# Patient Record
Sex: Female | Born: 1960 | Race: White | Hispanic: No | Smoking: Current every day smoker
Health system: Southern US, Community
[De-identification: ages and names within clinical notes are randomized; demographics above are authoritative.]

## PROBLEM LIST (undated history)

## (undated) DIAGNOSIS — C50919 Malignant neoplasm of unspecified site of unspecified female breast: Secondary | ICD-10-CM

## (undated) DIAGNOSIS — E7800 Pure hypercholesterolemia, unspecified: Secondary | ICD-10-CM

## (undated) DIAGNOSIS — K589 Irritable bowel syndrome without diarrhea: Secondary | ICD-10-CM

## (undated) DIAGNOSIS — C649 Malignant neoplasm of unspecified kidney, except renal pelvis: Secondary | ICD-10-CM

## (undated) HISTORY — PX: CHOLECYSTECTOMY: SHX55

## (undated) HISTORY — DX: Malignant neoplasm of unspecified site of unspecified female breast: C50.919

## (undated) HISTORY — PX: PARTIAL NEPHRECTOMY: SHX414

## (undated) HISTORY — PX: CATARACT EXTRACTION: SUR2

## (undated) HISTORY — PX: PARTIAL COLECTOMY: SHX5273

## (undated) HISTORY — DX: Pure hypercholesterolemia, unspecified: E78.00

## (undated) HISTORY — DX: Malignant neoplasm of unspecified kidney, except renal pelvis: C64.9

## (undated) HISTORY — DX: Irritable bowel syndrome, unspecified: K58.9

---

## 1998-10-02 ENCOUNTER — Other Ambulatory Visit: Admission: RE | Admit: 1998-10-02 | Discharge: 1998-10-02 | Payer: Self-pay | Admitting: Obstetrics and Gynecology

## 2000-02-11 ENCOUNTER — Encounter (INDEPENDENT_AMBULATORY_CARE_PROVIDER_SITE_OTHER): Payer: Self-pay | Admitting: Specialist

## 2000-02-11 ENCOUNTER — Inpatient Hospital Stay (HOSPITAL_COMMUNITY): Admission: RE | Admit: 2000-02-11 | Discharge: 2000-02-13 | Payer: Self-pay | Admitting: Urology

## 2003-02-15 ENCOUNTER — Inpatient Hospital Stay (HOSPITAL_COMMUNITY): Admission: RE | Admit: 2003-02-15 | Discharge: 2003-02-21 | Payer: Self-pay | Admitting: General Surgery

## 2003-02-15 ENCOUNTER — Encounter: Payer: Self-pay | Admitting: Emergency Medicine

## 2003-02-24 ENCOUNTER — Inpatient Hospital Stay (HOSPITAL_COMMUNITY): Admission: AD | Admit: 2003-02-24 | Discharge: 2003-03-07 | Payer: Self-pay | Admitting: General Surgery

## 2003-02-27 ENCOUNTER — Encounter (INDEPENDENT_AMBULATORY_CARE_PROVIDER_SITE_OTHER): Payer: Self-pay

## 2003-03-05 ENCOUNTER — Encounter: Payer: Self-pay | Admitting: General Surgery

## 2007-08-24 ENCOUNTER — Other Ambulatory Visit: Admission: RE | Admit: 2007-08-24 | Discharge: 2007-08-24 | Payer: Self-pay | Admitting: Obstetrics and Gynecology

## 2008-10-16 ENCOUNTER — Other Ambulatory Visit: Admission: RE | Admit: 2008-10-16 | Discharge: 2008-10-16 | Payer: Self-pay | Admitting: Obstetrics & Gynecology

## 2008-10-17 ENCOUNTER — Ambulatory Visit (HOSPITAL_COMMUNITY): Admission: RE | Admit: 2008-10-17 | Discharge: 2008-10-17 | Payer: Self-pay | Admitting: Family Medicine

## 2008-10-23 ENCOUNTER — Ambulatory Visit (HOSPITAL_COMMUNITY): Admission: RE | Admit: 2008-10-23 | Discharge: 2008-10-23 | Payer: Self-pay | Admitting: Obstetrics & Gynecology

## 2009-12-07 ENCOUNTER — Ambulatory Visit (HOSPITAL_COMMUNITY): Admission: RE | Admit: 2009-12-07 | Discharge: 2009-12-07 | Payer: Self-pay | Admitting: Obstetrics & Gynecology

## 2010-01-21 ENCOUNTER — Other Ambulatory Visit: Admission: RE | Admit: 2010-01-21 | Discharge: 2010-01-21 | Payer: Self-pay | Admitting: Obstetrics & Gynecology

## 2010-08-03 ENCOUNTER — Encounter: Payer: Self-pay | Admitting: Otolaryngology

## 2010-11-29 NOTE — H&P (Signed)
NAME:  KAYLON, LAROCHE NO.:  [id number]   MEDICAL RECORD NO.:  [id number]                   PATIENT TYPE:  INP   LOCATION:  0457                                 FACILITY:  [facility]   PHYSICIAN:  Sharlet Salina T. Hoxworth, M.D.          DATE OF BIRTH:  July 02, 1961   DATE OF ADMISSION:  02/15/2003  DATE OF DISCHARGE:                                HISTORY & PHYSICAL   CHIEF COMPLAINT:  Left lower quadrant abdominal pain.   HISTORY OF PRESENT ILLNESS:  Ms. Chenette is a 50 year old white female who  was in her usual state of health until about five days ago.  At that time  she developed the onset of intermittent left lower quadrant pain and fever.  The pain waxed and waned but never resolved completely.  She did not take  her temperature but felt definitely feverish.  At the same time she had the  onset of nausea and had occasional vomiting.  She initially attributed these  symptoms to reaction from contrast from a CT scan she had had the day before  for routine follow-up post nephrectomy for renal cell cancer.  This CT was  unremarkable.  Her symptoms, however, have worsened over the past couple of  days, and she presented to [facility] for evaluation today and was  seen by Dr. Gerda Diss.  She also has been having some intermittent diarrhea.  No melena or hematochezia.  She denies any history of any similar symptoms.   PAST MEDICAL HISTORY:  1. Surgical:     A. Right partial nephrectomy three years ago for renal cell carcinoma        with no evidence of recurrence.     B. Laparoscopic cholecystectomy in June 2001.  2. Medical:  Hypercholesterolemia.   CURRENT MEDICATIONS:  Lipitor 1 daily.   ALLERGIES:  PENICILLIN.   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  She smokes only an occasional cigarette.  Does not drink alcohol.   FAMILY HISTORY:  Noncontributory.   REVIEW OF SYSTEMS:  GENERAL:  Positive for  fever and occasional chills.  No  weight loss.  HEENT:  No vision, hearing, swallowing problems.  LUNGS:  She  is treated for occasional asthma related to seasonal allergies.  No current  shortness of breath, cough, wheezing.  CARDIAC:  Denies chest pain,  palpitations, history of heart disease.  ABDOMEN:  As above.  GENITOURINARY:  No urinary burning, frequency, or blood.  EXTREMITIES:  No joint pain or  swelling.  HEMATOLOGIC:  No history of blood clots or abnormal bleeding.   PHYSICAL EXAMINATION:  VITAL SIGNS:  Temperature is 98.4, pulse 95, blood  pressure 100/75, respirations 18.  GENERAL:  Well-developed white female in no acute distress.  SKIN:  Warm and dry.  Without rash or infection.  HEENT:  No palpable masses or thyromegaly.  Sclerae nonicteric.  Nose and  oropharynx clear.  LUNGS:  Clear to auscultation.  CARDIAC:  Regular rate and rhythm.  Without rubs or murmurs.  No edema or  JVD.  Peripheral pulses intact.  ABDOMEN:  Well-healed right upper quadrant and right flank incisions.  Nondistended.  Bowel sounds are present.  There is well-localized, moderate  left lower quadrant tenderness with some guarding.  Remainder of the abdomen  is soft and nontender.  No palpable masses or hepatosplenomegaly.  EXTREMITIES:  No joint swelling or deformity.  NEUROLOGIC:  Alert and oriented.  Neurosensory exam is grossly normal.   LABORATORY:  White count elevated at 12.9 thousand, hemoglobin normal at  12.5, platelets 340.  Urinalysis shows 7-10 white cells, 3-6 red cells, and  a few bacteria.  Electrolytes, LFTs, amylase, and lipase all within normal  limits.   CT scan of the abdomen and pelvis was obtained at [facility] today.  I have  a verbal report but cannot personally review the films at present.  This has  apparently shown a 5 cm abscess adjacent to the sigmoid colon most  consistent with walled-off, perforated sigmoid diverticulitis.   ASSESSMENT AND PLAN:  Apparent sigmoid  diverticulitis with pericolonic  abscess.  Nothing to suggest chronic bowel disease.  No particular symptoms  to suggest tumor, although this cannot be ruled out.  The patient will be  admitted, placed at bowel rest, and on IV Cipro and Flagyl.  Will consult  with intervention radiology regarding possible percutaneous drainage of this  abscess.  It would be best to manage this acute episode nonoperatively and  consider elective resection under better circumstances and after a more  thorough evaluation of her colon.                                               Lorne Skeens. Hoxworth, M.D.    Tory Emerald  D:  02/15/2003  T:  02/16/2003  Job:  [id number]   cc:   Lorin Picket A. Gerda Diss, M.D.  [address]  [place]  [place] [address]  Fax: [phone]

## 2010-11-29 NOTE — H&P (Signed)
NAMENOHEMY, KOOP NO.:  [id number]   MEDICAL RECORD NO.:  [id number]                   PATIENT TYPE:  INP   LOCATION:  0444                                 FACILITY:  [facility]   PHYSICIAN:  Sharlet Salina T. Hoxworth, M.D.          DATE OF BIRTH:  05-25-1961   DATE OF ADMISSION:  02/24/2003  DATE OF DISCHARGE:                                HISTORY & PHYSICAL   CHIEF COMPLAINT:  Left lower quadrant abdominal pain.   HISTORY OF PRESENT ILLNESS:  Monica Russell is a 50 year old white female who  at about the first of August developed the onset of intermittent left lower  quadrant abdominal pain and fever.  This worsened, and she developed nausea  and vomiting.  She was evaluated at [facility] by Dr. Gerda Diss with a  CT scan which revealed evidence of acute sigmoid diverticulitis, and about a  5 cm phlegmon or multi-loculated abscess adjacent to the sigmoid colon.  I  admitted the patient to [facility] at that time, and she was  started on IV antibiotics.  Consultation with interventional radiation  revealed that this area was difficult to access for percutaneous drainage,  and probably more importantly appeared to have several small fluid  collections and solid tissue, consistent more with a multi-loculated abscess  or phlegmon rather than a discrete abscess that would be amenable to  percutaneous drainage.  The patient, however, improved steadily on IV  antibiotics and was discharged approximately four days ago with minimal  discomfort, afebrile, and white count had decreased to 11,000 from a high of  17,000.  She was discharged home on oral antibiotics and initially did well,  but over the last 24-36 hours has developed progressively worsening  recurrent left lower quadrant abdominal pain and nausea with one episode of  emesis.  She had not had a bowel movement in about 24 hours.  She has  noticed some low-grade fever, as well.   PAST  MEDICAL HISTORY:   SURGICAL:  1. Partial right nephrectomy three years ago for renal cell carcinoma with     no evidence of recurrence on a recent CT scan.  2. Laparoscopic cholecystectomy in June 2001.   MEDICAL:  Hypercholesterolemia.   MEDICATIONS:  Lipitor daily.   ALLERGIES:  PENICILLIN.   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  Smokes an occasional cigarette.  Does not drink alcohol.   FAMILY HISTORY:  Noncontributory.   REVIEW OF SYSTEMS:  GENERAL:  Positive for malaise, low-grade fever.  LUNGS:  Treated for occasional asthma.  No current shortness of breath or wheezing.  CARDIAC:  No chest pain or palpitations.  ABDOMEN:  GI as above.  GU:  No  urinary symptoms.  EXTREMITIES:  No arthritis or joint swelling.   PHYSICAL EXAMINATION:  VITAL SIGNS:  In the office today, temperature was  100.  The rest  of her vital signs all within normal limits.  GENERAL:  She is a well-developed white female who appears uncomfortable.  SKIN:  Warm and dry without rash or infection.  HEENT:  No evidence of thyromegaly.  LYMPH NODES:  No cervical, supraclavicular, inguinal nodes palpable.  LUNGS:  Clear to auscultation.  CARDIAC:  Regular rhythm without murmurs.  ABDOMEN:  There was a well-healed right flank scar.  There is moderate  tenderness which is localized to the left lower quadrant with slight  guarding.  No palpable mass.  The remainder of the abdomen is soft and  nontender.  EXTREMITIES:  No joint swelling or deformity.  NEUROLOGIC:  Alert, fully oriented.  Motor and sensory exams grossly normal.   LABORATORY DATA:  Today's laboratory pending.   ASSESSMENT AND PLAN:  Sigmoid colon diverticulitis with pericolonic abscess  of phlegmon consistent with walled-off perforation.  She is not doing well  at home on oral antibiotics after initially responding to a course of IV  antibiotics in the hospital.  I think we will need to go ahead and move up   the time table for resection.  I am going to admit her to [facility] today, place her on clear liquids, and restart her IV antibiotics.  Will plan a gentle mechanical bowel prep over the next two days, and then  sigmoid colectomy, which hopefully we should be able to accomplish without a  colostomy.                                               Lorne Skeens. Hoxworth, M.D.    Tory Emerald  D:  02/24/2003  T:  02/24/2003  Job:  [id number]   cc:   Dr. Gerda Diss

## 2010-11-29 NOTE — Discharge Summary (Signed)
Monica Russell, Monica Russell NO.:  [id number]   MEDICAL RECORD NO.:  [id number]                   PATIENT TYPE:  INP   LOCATION:  0444                                 FACILITY:  [facility]   PHYSICIAN:  Sharlet Salina T. Hoxworth, M.D.          DATE OF BIRTH:  17-Mar-1961   DATE OF ADMISSION:  02/24/2003  DATE OF DISCHARGE:  03/07/2003                                 DISCHARGE SUMMARY   DISCHARGE DIAGNOSES:  Sigmoid colon diverticulitis with perforation and  abscess.   SURGICAL PROCEDURE:  Sigmoid colectomy with drainage of abscess on February 27, 2003.   HISTORY OF PRESENT ILLNESS:  Monica Russell is a 50 year old white female who  about the first of August developed the onset of intermittent left lower  quadrant abdominal pain and fever.  This worsened and she developed nausea  and vomiting.  She was evaluated at [facility] by Dr. Gerda Diss with a  CT scan which revealed evidence of acute sigmoid diverticulitis and an  approximately 5 cm phlegmon or multi loculated abscess adjacent to the  sigmoid colon.  I admitted the patient to [facility] at that time  and she was started on broad spectrum IV antibiotics.  Consultation was  obtained with interventional radiology who felt that this area was dangerous  to access due to its location and more importantly, appeared to have several  fluid collections and solid tissue density consistent with a multiloculated  abscess or phlegmon rather than discreet abscess and would be not amenable  to percutaneous drainage.  The patient, however, steadily improved on IV  antibiotics and was discharged four days prior to this admission having  minimal discomfort, afebrile, and white count decreased to 11,000 from a  high of 17,000.  She was discharged home on oral antibiotics and initially  did well but over the last 24-36 hours had developed progressively worsening  recurrent left lower quadrant abdominal pain and nausea  with one episode of  emesis.  She has not had a bowel movement in 24 hours.  She has noted low  grade fever as well.   PAST SURGICAL HISTORY:  1. Partial nephrectomy three years ago for renal cell carcinoma with no     evidence of recurrence on recent CT scan.  2. Laparoscopic cholecystectomy in June of 2001.   PAST MEDICAL HISTORY:  She treats for hypercholesterolemia.   MEDICATIONS:  Lipitor daily.   ALLERGIES:  PENICILLIN.   SOCIAL HISTORY:  See detailed H&P.   FAMILY HISTORY:  See detailed H&P.   REVIEW OF SYSTEMS:  See detailed H&P.   PHYSICAL EXAMINATION:  VITAL SIGNS:  Temperature 100.  Rest of vital signs  all within normal limits.  GENERAL:  She is a well-developed female who appears uncomfortable.  ABDOMEN:  Pertinent findings were limited to abdomen which revealed moderate  localized left lower quadrant tenderness with guarding.  No palpable mass.  LABORATORIES:  Normal urinalysis.  White count 19,000.  Potassium 3.4,  albumin 3.2.  Remainder of laboratory normal.   HOSPITAL COURSE:  The patient was admitted and restarted on IV antibiotics.  Due to failure to resolve this process with conservative management, I  recommended proceeding with colectomy during this hospitalization.  The  patient did improve somewhat on IV antibiotics over the next 48 hours and  was able to under a gentle mechanical and antibiotic bowel prep  preoperatively.  She was taken to the operating room on February 27, 2003.  She had a large abscess adjacent to the sigmoid colon and a localized area  of markedly thickened colon and mesentery most consistent with perforated  sigmoid diverticulitis.  This also involved the left fallopian tube in the  abscess wall.  The patient underwent a segmental sigmoid colectomy as well  as a left salpingectomy and had primary anastomosis of normal appearing  proximal and distal bowel.  Postoperatively she had low grade fever and was  continued on IV  antibiotics.  White count was elevated to 22,000 on the  first postoperative day.  Vital signs were stable.  NG tube was continued  postoperatively.  White count was decreased to 18,000 on the second  postoperative day.  NG tube was discontinued at this time.  She was feeling  much better by the third postoperative day, although her abdomen was mildly  distended.  She had increased abdominal distention on the fourth  postoperative day.  Also had wound drainage and her wound was opened and  packed for purulent wound infection.  White count was 17,000.  She had some  acute evidence of ileus with some distention and nausea over the next couple  of days but this gradually resolved.  By August 23 she had had a bowel  movement, was afebrile, and was feeling much better.  Her wound was cleaning  up nicely with moist saline gauze packing.  She was felt ready for discharge  on March 07, 2003.  Abdomen was soft and nontender.  She was tolerating a  regular diet.  Bowels were moving regularly.  Wound was packed with saline  gauze and is clean.  White blood count prior to discharge was decreased to  12,000.  Pathology indicated an abscess with diverticulitis.  Concern was  raised over possibly the left fallopian tube being the source but from  operative findings I feel confident this was perforated diverticulitis.  The  patient will continue on p.o. Cipro and Flagyl for the next five days.  She  is to follow up in my office in one week.                                               Lorne Skeens. Hoxworth, M.D.    Tory Emerald  D:  03/28/2003  T:  03/28/2003  Job:  [id number]   cc:   Gerda Diss, M.D.

## 2010-11-29 NOTE — Discharge Summary (Signed)
NAMESHYLA, Monica Russell NO.:  [id number]   MEDICAL RECORD NO.:  [id number]                   PATIENT TYPE:  INP   LOCATION:  0453                                 FACILITY:  [facility]   PHYSICIAN:  Sharlet Salina T. Hoxworth, M.D.          DATE OF BIRTH:  [birth year]   DATE OF ADMISSION:  02/15/2003  DATE OF DISCHARGE:  02/21/2003                                 DISCHARGE SUMMARY   DISCHARGE DIAGNOSIS:  Diverticulitis with abscess.   PROCEDURES:  None.   HISTORY OF PRESENT ILLNESS:  Monica Russell is a 50 year old white female in  her usual state of health until about five days prior to admission.  At that  time, she developed the onset of intermittent left lower quadrant abdominal  pain and fever.  Pain waxed and waned, but never resolved completely.  About  the same time, she had the onset of nausea and occasional vomiting.  She  initially attributed these symptoms to a reaction from oral contrast from a  CT scan she had the day before her illness obtained for routine followup  post nephrectomy for renal cell cancer.  This CT was unremarkable.  Her  symptoms, however, worsened over the past couple of days.  She presented to  [facility] on the day of admission for evaluation, was seen by Dr.  Gerda Diss.  The patient also had been having some intermittent diarrhea.  No  melena or hematochezia.  Denies any history of any similar GI symptoms.   PAST MEDICAL HISTORY:  1. Partial right nephrectomy three years ago for renal cell carcinoma with     no evidence of recurrence to date.  2. Laparoscopic cholecystectomy in 2001.  3. Hypercholesterolemia.   MEDICATIONS:  Lipitor one daily.   ALLERGIES:  PENICILLIN.   SOCIAL HISTORY:  For details, see the H&P.   FAMILY HISTORY:  For details, see the H&P.   REVIEW OF SYSTEMS:  For details, see the H&P.   PHYSICAL EXAMINATION:  VITAL SIGNS:  She is afebrile.  Pulse 95, blood  pressure 100/75, respirations 18.  GENERAL:  She is a well-developed white female in no distress.  ABDOMEN:  Pertinent findings were limited to the abdomen.  This shows a well-  healed right flank incision.  Nondistended.  Normal bowel sounds.  There is  well localized moderate left lower quadrant tenderness with some guarding,  but no peritoneal signs.  The abdomen is soft and nontender.  No  organomegaly.   LABORATORY DATA:  White count 12.9, hemoglobin normal.  Urinalysis 7 to 10  white cells.  Electrolytes, LFT's, amylase, and lipase all within normal  limits.   CT scan of the abdomen and pelvis obtained at [facility] were reviewed.  This shows a thickened sigmoid colon with diverticula and approximately 5 cm  multiloculated abscess and phlegmon adjacent to this.   HOSPITAL COURSE:  The patient was admitted with impression  of acute sigmoid  diverticulitis with perforation abscess.  Interventional radiology was  consulted, but due to the location of the abscess and its character with  multiple locations and areas of solid tissue, was not felt to be amenable to  percutaneous drainage.  The patient was treated with IV Cipro and Flagyl.  She steadily improved on this regimen.  She had low-grade fever for 24 hours  and no fever following this.  Her pain steadily resolved.  White count  decreased to 11,000 on February 20, 2003. On February 21, 2003, she had minimal  discomfort and tenderness.  Bowels were moving normally.  She was tolerating  a diet.  She was discharged home at this time on oral Cipro and Flagyl.  Plans were to follow up in my office in 7 to 10 days.                                                 Lorne Skeens. Hoxworth, M.D.    Tory Emerald  D:  03/28/2003  T:  03/28/2003  Job:  [id number]   cc:   Lorin Picket A. Gerda Diss, M.D.  [address], [address]  [place]  [place] [address]  Fax: [phone]

## 2010-11-29 NOTE — Op Note (Signed)
[place]  Patient:    Monica Russell, Monica Russell                        MRN: [id number] Proc. Date: 02/11/00 Adm. Date:  [id number] Attending:  Evlyn Clines CC:         Dr. Orvan July. Annabell Howells, M.D.                           Operative Report  PROCEDURE:  Right partial nephrectomy.  PREOPERATIVE DIAGNOSIS:  Right renal mass.  POSTOPERATIVE DIAGNOSIS:  Right renal cell carcinoma.  SURGEON:  Dr. Bjorn Pippin.  ASSISTANT:  Dr. Darvin Neighbours.  ANESTHESIA:  General.  SPECIMENS:  Tumor with parenchymal margin, was found to be renal cell carcinoma by frozen section with clear margins.  DRAINS:  Foley catheter.  COMPLICATIONS:  None.  INDICATIONS:  Ms. Skog is a 50 year old white female who was found on evaluation for gallbladder disease to have a 2-3 cm right lower pole lateral exophytic mass from the kidney that was suspicious for renal cell carcinoma. It was felt that partial nephrectomy was indicated.  FINDINGS AND PROCEDURE:  The patient was taken to the operating room where a general anesthetic was induced.  She had been fitted with thigh high TED hose and given Tequin 400 mg IV preoperatively.  She was placed in the supine position with a bump under the right chest.  A Foley catheter was inserted using sterile technique and placed to straight drainage.  The kidney was was raised partially.  Her abdomen was prepped with Betadine solution, she was draped in the usual sterile fashion.  A right subcostal incision was made from the midline to the tip of the twelvth rib.  Incision was carried down to the subcutaneous muscular fascial layers with the Bovie.  Peritoneal cavity was entered.  A Bookwalter retractor was placed.  The kidney was readily palpable beneath the incision.  The edge of the liver laid over the kidney.  This was padded and retracted out of the way.  Gerota fascia was then incised over the kidney leaving some  tissue over the palpable lesion.  The kidney was then dissected out of Gerota fascia until the hilum was readily apparent and could be easily manipulated.  Ureter was identified and avoided.  Once the kidney was freely exposed retractors were placed medially and laterally, and manual compression was used to divide hemostasis while the lesion was removed from the lateral edge of the lower pole of the kidney.  The capsule was incised with the Bovie, then using the Bovie tip without cautery the parenchyma was spread and divided.  Some small vessels were cauterized.  A 1 cm parenchymal margin was removed along with the tumor nodule.  This was sent for frozen section for analysis.  The vessels of the base of the tumor were then oversewn with 3-0 chromic figure-of-eight sutures.  Once adequate hemostasis had been achieved the remainder of the tumor bed was vigorously fulgurated with argon beam coagulated until no significant bleeding remained.  The parenchymal defect was then packed with Surgicel which was secured to the wound with interrupted 3-0 chromic stitches.  Gerota fascia was then closed back over the kidney using interrupted 3-0 chromic stitches.  No drain was left, as it was felt the collecting system had not been entered.  The frozen section returned consistent with renal cell carcinoma.  The margins were negative.  At this point the wound was irrigated, examined for hemostasis, no bleeding was noted. The fascia was then closed in two layers using running #1 PDS.  The subcutaneous tissues were irrigated.  The skin was closed with clips.  A dressing was applied.  The kidney biopsy was taken down.  The patients anesthetic was reversed, and she was moved to the recovery room in stable condition.  There were no complications during the procedure. DD:  02/11/00 TD:  02/11/00 Job: [id number]

## 2010-11-29 NOTE — Op Note (Signed)
NAMEJIMMI, SIDENER NO.:  [id number]   MEDICAL RECORD NO.:  [id number]                   PATIENT TYPE:  INP   LOCATION:  0444                                 FACILITY:  [facility]   PHYSICIAN:  Sharlet Salina T. Hoxworth, M.D.          DATE OF BIRTH:  1961-06-30   DATE OF PROCEDURE:  02/27/2003  DATE OF DISCHARGE:                                 OPERATIVE REPORT   PREOPERATIVE DIAGNOSIS:  Sigmoid colon diverticulitis with abscess.   POSTOPERATIVE DIAGNOSIS:  Sigmoid colon diverticulitis with abscess.   SURGICAL PROCEDURE:  Sigmoid colectomy with drainage of abscess.   SURGEON:  Lorne Skeens. Hoxworth, M.D.   ASSISTANT:  Sandria Bales. Ezzard Standing, M.D.   BRIEF HISTORY:  Monica Russell is a 50 year old white female, who initially  presented approximately the first of this month with acute left lower  quadrant abdominal pain.  CT scan at that time showed inflammatory change  and diverticula of the sigmoid colon with a 4-5 cm pericolonic abscess  consistent with walled-off perforation.  The abscess appeared multi-  loculated with some solid tissue and in a difficult location, and  percutaneous drainage was not felt to be feasible.  The patient, however,  improved over about 5-6 days in the hospital with IV antibiotics and was  discharged.  She, however, returned several days later on oral antibiotics  with increasing pain and tenderness in the left lower quadrant and was  readmitted.  We have been able to give her a gentle mechanical and  antibiotic bowel prep and have recommended proceeding with sigmoid  colectomy.  The nature of the procedure, its indications, risks of bleeding,  infection, anastomotic leak, and possible need for a temporary colostomy  were discussed and understood.  He is now brought to the operating room for  this procedure.   DESCRIPTION OF OPERATION:  The patient was brought to the operating room and  placed in supine position on the operating  table, and general endotracheal  anesthesia was induced.  She was already on broad-spectrum IV antibiotics.  The abdomen was sterilely prepped and draped.  Foley catheter and  nasogastric tube were placed.  With the patient asleep, a good-sized mass  was palpable in the left lower quadrant.  A lower midline incision was used  and dissection carried down through subcutaneous tissue and midline fascia  using cautery and the peritoneum entered under direct vision.  Exploration  revealed a very large inflammatory mass involving the sigmoid colon, uterus,  and left adnexa.  The tissue planes were very difficult, particularly  between the adnexa and the colon.  Using careful blunt dissection, the colon  was separated from the uterus and in this plane, a large abscess was entered  and pus drained and cultured.  A fairly normal-appearing distal sigmoid  beyond this process and proximal sigmoid to left colon were identified.  Using mostly blunt dissection, the large inflammatory mass was  separated  from adhesions along the pelvic rim and from the uterus and left tube and  ovary.  Peritoneal attachments laterally were carefully dissected and  divided and eventually with fairly tedious dissection, the entire  inflammatory mass was freed from surrounding adhesions and able to be  brought up into the abdominal wound.  The colon proximal to this where there  did not appear to be any further diverticular inflammatory changes was  divided with the GIA stapler.  The mesentery and the involved segment was  then sequentially divided between clamps, staying very close to the bowel  wall, as there was far too much inflammation to try to identify the left  ureter.  Dissection progressed distally until we were beyond the inflamed  segment and to, again, quite normal-appearing rectosigmoid and, at this  point, the bowel was cleaned of mesentery and pericolic fat and two stay  sutures securing the rectosigmoid was  divided and the specimen removed.  The  abdomen was copiously irrigated with warm saline.  The abscess had been very  discrete and walled-off, and the remaining bowel appeared quite healthy with  good blood supply, under no tension, and not edematous, and I felt it was  safe to proceed with primary anastomosis.  She had undergone a good  mechanical bowel prep.  An end-to-end anastomosis was then created with  interrupted full-thickness 2-0 silk sutures circumferentially.  It appeared  widely patent, under no tension, with good blood supply.  Following this,  all gloves and instruments were changed.  The abdomen was copiously  irrigated with warm saline.  Hemostasis was assured.  The viscera were  returned to their anatomic position.  The midline fascia was closed with  running #1 PDS begun at either end of the incision and tied centrally.  The  subcutaneous tissue was irrigated with antibiotic solution and skin closed  with staples.  Sponge, needle, and instrument counts were correct.  Dry  sterile dressing was applied and the patient taken to recovery in good  condition.                                               Lorne Skeens. Hoxworth, M.D.    Tory Emerald  D:  02/27/2003  T:  02/27/2003  Job:  [id number]

## 2011-03-12 ENCOUNTER — Other Ambulatory Visit: Payer: Self-pay | Admitting: Obstetrics & Gynecology

## 2011-03-12 DIAGNOSIS — Z139 Encounter for screening, unspecified: Secondary | ICD-10-CM

## 2011-03-13 ENCOUNTER — Ambulatory Visit (HOSPITAL_COMMUNITY)
Admission: RE | Admit: 2011-03-13 | Discharge: 2011-03-13 | Disposition: A | Discharge status: Home / Self Care | Payer: 59 | Source: Ambulatory Visit | Attending: Obstetrics & Gynecology | Admitting: Obstetrics & Gynecology

## 2011-03-13 DIAGNOSIS — Z1231 Encounter for screening mammogram for malignant neoplasm of breast: Secondary | ICD-10-CM | POA: Insufficient documentation

## 2011-03-13 DIAGNOSIS — Z139 Encounter for screening, unspecified: Secondary | ICD-10-CM

## 2011-03-14 ENCOUNTER — Other Ambulatory Visit: Payer: Self-pay | Admitting: Family Medicine

## 2011-03-14 ENCOUNTER — Ambulatory Visit (HOSPITAL_COMMUNITY)
Admission: RE | Admit: 2011-03-14 | Discharge: 2011-03-14 | Disposition: A | Discharge status: Home / Self Care | Payer: 59 | Source: Ambulatory Visit | Attending: Family Medicine | Admitting: Family Medicine

## 2011-03-14 DIAGNOSIS — R0600 Dyspnea, unspecified: Secondary | ICD-10-CM

## 2011-03-14 DIAGNOSIS — R0609 Other forms of dyspnea: Secondary | ICD-10-CM | POA: Insufficient documentation

## 2011-03-14 DIAGNOSIS — R0989 Other specified symptoms and signs involving the circulatory and respiratory systems: Secondary | ICD-10-CM | POA: Insufficient documentation

## 2012-01-26 ENCOUNTER — Other Ambulatory Visit: Payer: Self-pay | Admitting: Obstetrics & Gynecology

## 2012-01-26 DIAGNOSIS — Z139 Encounter for screening, unspecified: Secondary | ICD-10-CM

## 2012-03-16 ENCOUNTER — Other Ambulatory Visit: Payer: Self-pay | Admitting: Adult Health

## 2012-03-16 ENCOUNTER — Other Ambulatory Visit (HOSPITAL_COMMUNITY)
Admission: RE | Admit: 2012-03-16 | Discharge: 2012-03-16 | Disposition: A | Discharge status: Home / Self Care | Payer: BC Managed Care – PPO | Source: Ambulatory Visit | Attending: Obstetrics and Gynecology | Admitting: Obstetrics and Gynecology

## 2012-03-16 ENCOUNTER — Ambulatory Visit (HOSPITAL_COMMUNITY)
Admission: RE | Admit: 2012-03-16 | Discharge: 2012-03-16 | Disposition: A | Discharge status: Home / Self Care | Payer: BC Managed Care – PPO | Source: Ambulatory Visit | Attending: Obstetrics & Gynecology | Admitting: Obstetrics & Gynecology

## 2012-03-16 DIAGNOSIS — Z01419 Encounter for gynecological examination (general) (routine) without abnormal findings: Secondary | ICD-10-CM | POA: Insufficient documentation

## 2012-03-16 DIAGNOSIS — Z1231 Encounter for screening mammogram for malignant neoplasm of breast: Secondary | ICD-10-CM | POA: Insufficient documentation

## 2012-03-16 DIAGNOSIS — Z1151 Encounter for screening for human papillomavirus (HPV): Secondary | ICD-10-CM | POA: Insufficient documentation

## 2012-03-16 DIAGNOSIS — Z139 Encounter for screening, unspecified: Secondary | ICD-10-CM

## 2012-03-19 ENCOUNTER — Encounter (INDEPENDENT_AMBULATORY_CARE_PROVIDER_SITE_OTHER): Payer: Self-pay | Admitting: *Deleted

## 2012-04-07 ENCOUNTER — Other Ambulatory Visit (INDEPENDENT_AMBULATORY_CARE_PROVIDER_SITE_OTHER): Payer: Self-pay | Admitting: *Deleted

## 2012-04-07 ENCOUNTER — Telehealth (INDEPENDENT_AMBULATORY_CARE_PROVIDER_SITE_OTHER): Payer: Self-pay | Admitting: *Deleted

## 2012-04-07 ENCOUNTER — Encounter (INDEPENDENT_AMBULATORY_CARE_PROVIDER_SITE_OTHER): Payer: Self-pay | Admitting: Internal Medicine

## 2012-04-07 ENCOUNTER — Ambulatory Visit (INDEPENDENT_AMBULATORY_CARE_PROVIDER_SITE_OTHER): Payer: BC Managed Care – PPO | Admitting: Internal Medicine

## 2012-04-07 VITALS — BP 90/60 | HR 72 | Temp 98.0°F | Ht 67.0 in | Wt 172.0 lb

## 2012-04-07 DIAGNOSIS — Z1211 Encounter for screening for malignant neoplasm of colon: Secondary | ICD-10-CM

## 2012-04-07 DIAGNOSIS — K5792 Diverticulitis of intestine, part unspecified, without perforation or abscess without bleeding: Secondary | ICD-10-CM | POA: Insufficient documentation

## 2012-04-07 DIAGNOSIS — R197 Diarrhea, unspecified: Secondary | ICD-10-CM | POA: Insufficient documentation

## 2012-04-07 DIAGNOSIS — K580 Irritable bowel syndrome with diarrhea: Secondary | ICD-10-CM | POA: Insufficient documentation

## 2012-04-07 DIAGNOSIS — K219 Gastro-esophageal reflux disease without esophagitis: Secondary | ICD-10-CM

## 2012-04-07 MED ORDER — DICYCLOMINE HCL 10 MG PO CAPS: 10.0000 mg | ORAL_CAPSULE | Freq: Two times a day (BID) | ORAL | Status: DC

## 2012-04-07 NOTE — Progress Notes (Signed)
Subjective:     Patient ID: Monica Russell, female   DOB: 02-20-1961, 51 y.o.   MRN: [id number]  HPI [name] is a 51 yr old female referred to our office by Cyril Mourning  For heartburn.   She tells me she has a hernia and has to manipulate it back in place. She tells me she has a odor from her rectum. She tells me when she eats she will have to go to the bathroom. She has frequent gas.  She has to wear a pad daily due to fecal incontinence. She feel like when she has a BM, it is not complete.  She usually has 3-5 stools a day. Stools are for the most part loose. She tells me she has early satiety. She feels measurable.  Milk seem to bother her. She has never undergone a colonoscopy.  She has a hx of diverticulitis and underwent a partial colectomy in 2004 for diveticulitis/abscess.  Since her partial colectomy, she has not had a bout of diverticulitis. Appetite is good. No weight. She did have rectal bleeding a couple of months ago when her hemorrhoid flared. She tells me she takes Nexium on a prn basis for her acid reflux which helps.  She is careful what she eats. Occasionally acid reflux which is food related. She know to avoid citrus, and spicy foods.    Review of Systems see hpi Current Outpatient Prescriptions  Medication Sig Dispense Refill  . simvastatin (ZOCOR) 40 MG tablet Take 40 mg by mouth every evening.       Past Medical History  Diagnosis Date  . High cholesterol    History   Social History  . Marital Status: Married    Spouse Name: N/A    Number of Children: N/A  . Years of Education: N/A   Occupational History  . Not on file.   Social History Main Topics  . Smoking status: Current Every Day Smoker  . Smokeless tobacco: Not on file   Comment: 10-12 a day  . Alcohol Use: No  . Drug Use: No  . Sexually Active: Not on file   Other Topics Concern  . Not on file   Social History Narrative  . No narrative on file   Family Status  Relation Status Death  Age  . Mother Alive     good health  . Father Other     unknown  . Brother Alive     good health   Allergies  Allergen Reactions  . Penicillins         Objective:   Physical Exam Filed Vitals:   04/07/12 0916  BP: 90/60  Pulse: 72  Temp: 98 F (36.7 C)  Height: 4\' 9"  (1.448 m)  Weight: 172 lb (78.019 kg)   Alert and oriented. Skin warm and dry. Oral mucosa is moist.   . Sclera anicteric, conjunctivae is pink. Thyroid not enlarged. No cervical lymphadenopathy. Lungs clear. Heart regular rate and rhythm.  Abdomen is soft. Bowel sounds are positive. No hepatomegaly. No abdominal masses felt. No tenderness.  No edema to lower extremities. Patient is alert and oriented.      Assessment:    Diarrhea, possible IBS, Hx of sigmoid diverticulitis with partial colectomy. She has never undergone a screening colonoscopy in the pat. . In need of screening colonoscopy. GERD which is controlled with Nexium on a prn basis.      Plan:    Increase fiber. Dicyclomine 10mg  BID. Colonoscopy. The risks and benefits  such as perforation, bleeding, and infection were reviewed with the patient and is agreeable.

## 2012-04-07 NOTE — Telephone Encounter (Signed)
Patient needs movi prep 

## 2012-04-07 NOTE — Patient Instructions (Addendum)
Colonoscopy with Dr. Karilyn Cota. Dicyclomine 10mg  BID. Increase fiber.

## 2012-04-08 MED ORDER — PEG-KCL-NACL-NASULF-NA ASC-C 100 G PO SOLR: 1.0000 | Freq: Once | ORAL | Status: DC

## 2012-04-22 ENCOUNTER — Encounter (HOSPITAL_COMMUNITY): Payer: Self-pay | Admitting: Pharmacy Technician

## 2012-04-27 MED ORDER — SODIUM CHLORIDE 0.45 % IV SOLN
INTRAVENOUS | Status: DC
  Administered 2012-04-28: 1000 mL via INTRAVENOUS

## 2012-04-28 ENCOUNTER — Encounter (HOSPITAL_COMMUNITY): Payer: Self-pay | Admitting: *Deleted

## 2012-04-28 ENCOUNTER — Ambulatory Visit (HOSPITAL_COMMUNITY)
Admission: RE | Admit: 2012-04-28 | Discharge: 2012-04-28 | Disposition: A | Discharge status: Home / Self Care | Payer: BC Managed Care – PPO | Source: Ambulatory Visit | Attending: Internal Medicine | Admitting: Internal Medicine

## 2012-04-28 ENCOUNTER — Encounter (HOSPITAL_COMMUNITY)
Admission: RE | Disposition: A | Discharge status: Home / Self Care | Payer: Self-pay | Source: Ambulatory Visit | Attending: Internal Medicine

## 2012-04-28 DIAGNOSIS — K5730 Diverticulosis of large intestine without perforation or abscess without bleeding: Secondary | ICD-10-CM

## 2012-04-28 DIAGNOSIS — K644 Residual hemorrhoidal skin tags: Secondary | ICD-10-CM | POA: Insufficient documentation

## 2012-04-28 DIAGNOSIS — Z1211 Encounter for screening for malignant neoplasm of colon: Secondary | ICD-10-CM

## 2012-04-28 DIAGNOSIS — Z9049 Acquired absence of other specified parts of digestive tract: Secondary | ICD-10-CM | POA: Insufficient documentation

## 2012-04-28 HISTORY — PX: COLONOSCOPY: SHX5424

## 2012-04-28 SURGERY — COLONOSCOPY
Anesthesia: Moderate Sedation

## 2012-04-28 MED ORDER — STERILE WATER FOR IRRIGATION IR SOLN
Status: DC | PRN
  Administered 2012-04-28: 14:00:00

## 2012-04-28 MED ORDER — MEPERIDINE HCL 50 MG/ML IJ SOLN
INTRAMUSCULAR | Status: AC
  Filled 2012-04-28: qty 1

## 2012-04-28 MED ORDER — MIDAZOLAM HCL 5 MG/5ML IJ SOLN
INTRAMUSCULAR | Status: AC
  Filled 2012-04-28: qty 10

## 2012-04-28 MED ORDER — MEPERIDINE HCL 50 MG/ML IJ SOLN
INTRAMUSCULAR | Status: DC | PRN
  Administered 2012-04-28 (×2): 25 mg via INTRAVENOUS

## 2012-04-28 MED ORDER — MIDAZOLAM HCL 5 MG/5ML IJ SOLN
INTRAMUSCULAR | Status: DC | PRN
  Administered 2012-04-28: 2 mg via INTRAVENOUS
  Administered 2012-04-28: 1 mg via INTRAVENOUS
  Administered 2012-04-28: 2 mg via INTRAVENOUS

## 2012-04-28 NOTE — Op Note (Signed)
COLONOSCOPY PROCEDURE REPORT  PATIENT:  Monica Russell  MR#:  [id number] Birthdate:  22-Jul-1960, 51 y.o., female Endoscopist:  Dr. Malissa Hippo, MD Referred By:  Dr. Lilyan Punt, MD Procedure Date: 04/28/2012  Procedure:   Colonoscopy  Indications:  Patient is 51 year old Caucasian female was undergoing average risk screening colonoscopy.  Informed Consent:  The procedure and risks were reviewed with the patient and informed consent was obtained.  Medications:  Demerol 50 mg IV Versed 5 mg IV  Description of procedure:  After a digital rectal exam was performed, that colonoscope was advanced from the anus through the rectum and colon to the area of the cecum, ileocecal valve and appendiceal orifice. The cecum was deeply intubated. These structures were well-seen and photographed for the record. From the level of the cecum and ileocecal valve, the scope was slowly and cautiously withdrawn. The mucosal surfaces were carefully surveyed utilizing scope tip to flexion to facilitate fold flattening as needed. The scope was pulled down into the rectum where a thorough exam including retroflexion was performed.  Findings:   Prep excellent. Few scattered diverticula throughout the colon. Wide open colonic anastomosis at 18 cm from the anal margin. Normal rectal mucosa. Small hemorrhoids below the dentate line.   Therapeutic/Diagnostic Maneuvers Performed:  None  Complications:  None  Cecal Withdrawal Time:  7 minutes  Impression:  Examination performed to cecum. Few scattered diverticula throughout the colon. Colonic anastomosis at 18 cm from anal margin. Small external hemorrhoids.   Recommendations:  Standard instructions given. Next screening exam in 10 years.  Seema Blum U  04/28/2012 2:40 PM  CC: Dr. Lilyan Punt, MD & Dr. Bonnetta Barry ref. provider found

## 2012-04-28 NOTE — H&P (Signed)
Monica Russell is an 51 y.o. female.   Chief Complaint: Patient is here for colonoscopy. HPI: This 51 year old Caucasian female who is in for screening colonoscopy. She denies diarrhea and constipation. He had 2 episodes of hematochezia felt to be secondary to hemorrhoids. This is patient's first exam. Personal history significant for renal cell carcinoma for which she had wedge resection and is doing well. About 9 years ago she sigmoid colon resection for complicated diverticulitis and has not had any recurrence. Family history is negative for colorectal carcinoma.  Past Medical History  Diagnosis Date  . High cholesterol     Past Surgical History  Procedure Date  . Partial nephrectomy     11 yrs ago for a renal carcinoma, right  . Partial colectomy     for diverticulitis  . Cholecystectomy     History reviewed. No pertinent family history. Social History:  reports that she has been smoking.  She does not have any smokeless tobacco history on file. She reports that she does not drink alcohol or use illicit drugs.  Allergies:  Allergies  Allergen Reactions  . Penicillins Hives    Medications Prior to Admission  Medication Sig Dispense Refill  . peg 3350 powder (MOVIPREP) 100 G SOLR Take 1 kit (100 g total) by mouth once.  1 kit  0  . simvastatin (ZOCOR) 40 MG tablet Take 40 mg by mouth every evening.      . dicyclomine (BENTYL) 10 MG capsule Take 1 capsule (10 mg total) by mouth 2 (two) times daily before a meal.  60 capsule  2    No results found for this or any previous visit (from the past 48 hour(s)). No results found.  ROS  Blood pressure 133/81, pulse 95, temperature 98 F (36.7 C), temperature source Oral, resp. rate 18, height 5\' 7"  (1.702 m), weight 172 lb (78.019 kg), SpO2 96.00%. Physical Exam  Constitutional: She appears well-developed and well-nourished.  HENT:  Mouth/Throat: Oropharynx is clear and moist.  Eyes: Conjunctivae normal are normal. No scleral  icterus.  Neck: No thyromegaly present.  Cardiovascular: Normal rate, regular rhythm and normal heart sounds.   No murmur heard. Respiratory: Effort normal and breath sounds normal.  GI: Soft. She exhibits no distension and no mass. There is no tenderness.  Musculoskeletal: She exhibits no edema.  Lymphadenopathy:    She has no cervical adenopathy.  Neurological: She is alert.  Skin: Skin is warm and dry.     Assessment/Plan Average risk screening colonoscopy.  REHMAN,NAJEEB U 04/28/2012, 2:11 PM

## 2012-05-06 ENCOUNTER — Encounter (HOSPITAL_COMMUNITY): Payer: Self-pay | Admitting: Internal Medicine

## 2012-05-10 ENCOUNTER — Ambulatory Visit (INDEPENDENT_AMBULATORY_CARE_PROVIDER_SITE_OTHER): Payer: BC Managed Care – PPO | Admitting: Internal Medicine

## 2012-08-11 ENCOUNTER — Ambulatory Visit (INDEPENDENT_AMBULATORY_CARE_PROVIDER_SITE_OTHER): Payer: BC Managed Care – PPO | Admitting: Internal Medicine

## 2012-08-13 ENCOUNTER — Ambulatory Visit (INDEPENDENT_AMBULATORY_CARE_PROVIDER_SITE_OTHER): Payer: BC Managed Care – PPO | Admitting: Internal Medicine

## 2012-08-13 ENCOUNTER — Encounter (INDEPENDENT_AMBULATORY_CARE_PROVIDER_SITE_OTHER): Payer: Self-pay | Admitting: Internal Medicine

## 2012-08-13 VITALS — BP 130/70 | HR 72 | Temp 97.8°F | Ht 67.0 in | Wt 172.3 lb

## 2012-08-13 DIAGNOSIS — L290 Pruritus ani: Secondary | ICD-10-CM

## 2012-08-13 MED ORDER — HYDROCORTISONE ACE-PRAMOXINE 1-1 % RE FOAM: 1.0000 | Freq: Two times a day (BID) | RECTAL | Status: DC

## 2012-08-13 NOTE — Patient Instructions (Addendum)
Proctofoam

## 2012-08-13 NOTE — Progress Notes (Signed)
Subjective:     Patient ID: Monica Russell, female   DOB: 1961/01/15, 52 y.o.   MRN: [id number]  HPIHere today with c/o of anal itch. She has tried Preparation H for the itch which did help some.  Itching for over a month. She says the odor is not coming from her vagina. She is married but not sexually active. When she has a BM, she has to use wipes for fecal seepage.  When she wipes, she has to wipe, and wipe. She also c/o a fishy smell when she has a BM. Appetite is good. No weight loss. She watches what she eats. No recent antibiotics  Impression: 05/2012 Colonoscopy Examination performed to cecum.  Few scattered diverticula throughout the colon.  Colonic anastomosis at 18 cm from anal margin.  Small external hemorrhoids.    Review of Systems Current Outpatient Prescriptions  Medication Sig Dispense Refill  . dicyclomine (BENTYL) 10 MG capsule Take 10 mg by mouth as needed.      . simvastatin (ZOCOR) 40 MG tablet Take 40 mg by mouth every evening.      . hydrocortisone-pramoxine (PROCTOFOAM-HC) rectal foam Place 1 applicator rectally every 12 (twelve) hours.  10 g  1   Past Medical History  Diagnosis Date  . High cholesterol    Past Surgical History  Procedure Date  . Partial nephrectomy     11 yrs ago for a renal carcinoma, right  . Partial colectomy     for diverticulitis  . Cholecystectomy   . Colonoscopy 04/28/2012    Procedure: COLONOSCOPY;  Surgeon: Malissa Hippo, MD;  Location: [facility];  Service: Endoscopy;  Laterality: N/A;  155   Allergies  Allergen Reactions  . Penicillins Hives       Objective:   Physical Exam  Filed Vitals:   08/13/12 0905  BP: 130/70  Pulse: 72  Temp: 97.8 F (36.6 C)  Height: 5\' 7"  (1.702 m)  Weight: 172 lb 4.8 oz (78.155 kg)    Alert and oriented. Skin warm and dry. Oral mucosa is moist.   . Sclera anicteric, conjunctivae is pink. Thyroid not enlarged. No cervical lymphadenopathy. Lungs clear. Heart regular rate and  rhythm.  Abdomen is soft. Bowel sounds are positive. No hepatomegaly. No abdominal masses felt. No tenderness.  No edema to lower extremities.  Rectal exam: two raw, areas (small) noted around rectum.  No discharge. No odor.      Assessment:    Rectal itching. Possible hemorrhoidal. I did not smell an odor today.    Plan:    Protcofoam x 10 days. Dicyclomine 10mg  BID. PR in 1 week.

## 2012-08-28 ENCOUNTER — Other Ambulatory Visit: Payer: Self-pay

## 2013-01-05 ENCOUNTER — Telehealth: Payer: Self-pay | Admitting: Nurse Practitioner

## 2013-01-05 NOTE — Telephone Encounter (Addendum)
bw papers please and maill to pts home address

## 2013-01-05 NOTE — Telephone Encounter (Signed)
Requesting lab work orders

## 2013-01-06 ENCOUNTER — Other Ambulatory Visit: Payer: Self-pay | Admitting: Family Medicine

## 2013-01-10 ENCOUNTER — Encounter: Payer: Self-pay | Admitting: *Deleted

## 2013-01-10 NOTE — Telephone Encounter (Signed)
Checking to see if this needs to still be routed to her provider? Don't think this was ever completed..Thanks

## 2013-01-11 NOTE — Telephone Encounter (Signed)
First time I have seen this. Please send back chart.  Thanks

## 2013-01-12 ENCOUNTER — Other Ambulatory Visit: Payer: Self-pay | Admitting: Nurse Practitioner

## 2013-01-12 DIAGNOSIS — R5383 Other fatigue: Secondary | ICD-10-CM

## 2013-01-12 DIAGNOSIS — Z13220 Encounter for screening for lipoid disorders: Secondary | ICD-10-CM

## 2013-01-13 ENCOUNTER — Other Ambulatory Visit: Payer: Self-pay | Admitting: Nurse Practitioner

## 2013-02-14 ENCOUNTER — Other Ambulatory Visit: Payer: Self-pay | Admitting: Obstetrics & Gynecology

## 2013-02-14 DIAGNOSIS — Z139 Encounter for screening, unspecified: Secondary | ICD-10-CM

## 2013-03-21 ENCOUNTER — Ambulatory Visit (HOSPITAL_COMMUNITY)
Admission: RE | Admit: 2013-03-21 | Discharge: 2013-03-21 | Disposition: A | Discharge status: Home / Self Care | Payer: BC Managed Care – PPO | Source: Ambulatory Visit | Attending: Obstetrics & Gynecology | Admitting: Obstetrics & Gynecology

## 2013-03-21 ENCOUNTER — Telehealth: Payer: Self-pay | Admitting: Obstetrics & Gynecology

## 2013-03-21 DIAGNOSIS — Z139 Encounter for screening, unspecified: Secondary | ICD-10-CM

## 2013-03-21 DIAGNOSIS — Z1231 Encounter for screening mammogram for malignant neoplasm of breast: Secondary | ICD-10-CM | POA: Insufficient documentation

## 2013-03-21 LAB — HEPATIC FUNCTION PANEL
ALT: 12 U/L (ref 0–35)
AST: 13 U/L (ref 0–37)
Albumin: 4.6 g/dL (ref 3.5–5.2)
Alkaline Phosphatase: 66 U/L (ref 39–117)
Bilirubin, Direct: 0.1 mg/dL (ref 0.0–0.3)
Indirect Bilirubin: 0.4 mg/dL (ref 0.0–0.9)
Total Bilirubin: 0.5 mg/dL (ref 0.3–1.2)
Total Protein: 6.9 g/dL (ref 6.0–8.3)

## 2013-03-21 LAB — LIPID PANEL
HDL: 51 mg/dL (ref >39)
LDL Cholesterol: 151 mg/dL — ABNORMAL HIGH (ref 0–99)
Total CHOL/HDL Ratio: 4.5 Ratio
Triglycerides: 150 mg/dL — ABNORMAL HIGH (ref <150)
VLDL: 30 mg/dL (ref 0–40)

## 2013-03-21 LAB — BASIC METABOLIC PANEL
CO2: 28 mEq/L (ref 19–32)
Calcium: 9.7 mg/dL (ref 8.4–10.5)
Creat: 0.6 mg/dL (ref 0.50–1.10)
Sodium: 140 mEq/L (ref 135–145)

## 2013-03-21 LAB — TSH: TSH: 1.338 u[IU]/mL (ref 0.350–4.500)

## 2013-03-21 MED ORDER — NITROFURANTOIN MONOHYD MACRO 100 MG PO CAPS: 100.0000 mg | ORAL_CAPSULE | Freq: Two times a day (BID) | ORAL | Status: DC

## 2013-03-21 NOTE — Telephone Encounter (Signed)
Macrobid e prescribed per pt request

## 2013-03-24 ENCOUNTER — Encounter: Payer: Self-pay | Admitting: Nurse Practitioner

## 2013-05-11 ENCOUNTER — Other Ambulatory Visit: Payer: Self-pay | Admitting: Family Medicine

## 2013-05-19 ENCOUNTER — Other Ambulatory Visit: Payer: Self-pay

## 2013-06-13 ENCOUNTER — Ambulatory Visit: Payer: Self-pay | Admitting: Nurse Practitioner

## 2013-07-01 ENCOUNTER — Encounter: Payer: Self-pay | Admitting: Nurse Practitioner

## 2013-07-01 ENCOUNTER — Ambulatory Visit (INDEPENDENT_AMBULATORY_CARE_PROVIDER_SITE_OTHER): Payer: BC Managed Care – PPO | Admitting: Nurse Practitioner

## 2013-07-01 VITALS — BP 128/78 | Ht 67.0 in | Wt 172.0 lb

## 2013-07-01 DIAGNOSIS — L290 Pruritus ani: Secondary | ICD-10-CM

## 2013-07-01 DIAGNOSIS — E559 Vitamin D deficiency, unspecified: Secondary | ICD-10-CM

## 2013-07-01 DIAGNOSIS — E785 Hyperlipidemia, unspecified: Secondary | ICD-10-CM | POA: Insufficient documentation

## 2013-07-01 NOTE — Patient Instructions (Signed)
Align as directed  

## 2013-07-04 ENCOUNTER — Encounter: Payer: Self-pay | Admitting: Nurse Practitioner

## 2013-07-04 DIAGNOSIS — E559 Vitamin D deficiency, unspecified: Secondary | ICD-10-CM | POA: Insufficient documentation

## 2013-07-04 NOTE — Progress Notes (Addendum)
Subjective:  Presents for routine followup of hyperlipidemia. No chest pain shortness of breath or edema. Compliant with medication. Struggling some with her diet especially with holidays. Just completed a full GI workup. Complaints of rectal odor and itching, has discussed this with her specialist. Continues to have alternating cycles of diarrhea and constipation, no blood in her stool. Recent colonoscopy was normal. Occasional mild irritation at the rectal area. No active hemorrhoids.  Objective:   BP 128/78  Ht 5\' 7"  (1.702 m)  Wt 172 lb (78.019 kg)  BMI 26.93 kg/m2 NAD. Alert, oriented. Lungs clear. Heart regular rate rhythm. Patient defers rectal exam. Vitamin D level slightly low at 25 on her last blood work in September. LDL 151. Presented with lab work from her job dated 06/21/2013 total cholesterol 195, HDL 49, triglycerides 95, LDL 127.  Assessment:Hyperlipemia  Rectal itching  Vitamin D insufficiency   Plan: Hydrocortisone 1% cream twice a day to rectal area when necessary no more than 2 weeks at a time. Also recommend barrier cream such as Desitin. Recommend daily probiotic. Encouraged patient to watch her diet to see if any particular foods worsen her symptoms. Lengthy discussion regarding her cigarette smoking. Encouraged patient to at least cut back. Defers medication at this point. Daily vitamin D 1000 units. Recheck in 6 months, call back sooner if any problems. Note patient gets regular preventive health physicals. Discussed risks associated with elevated cholesterol and smoking. Recommend low-fat, low-cholesterol diet. Recheck labs in 3 months. Continue Zocor as directed.

## 2013-07-04 NOTE — Assessment & Plan Note (Signed)
Plan: Hydrocortisone 1% cream twice a day to rectal area when necessary no more than 2 weeks at a time. Also recommend barrier cream such as Desitin. Recommend daily probiotic. Encouraged patient to watch her diet to see if any particular foods worsen her symptoms. Lengthy discussion regarding her cigarette smoking. Encouraged patient to at least cut back. Defers medication at this point. Daily vitamin D 1000 units. Recheck in 6 months, call back sooner if any problems. Note patient gets regular preventive health physicals

## 2013-07-04 NOTE — Assessment & Plan Note (Addendum)
Plan: Hydrocortisone 1% cream twice a day to rectal area when necessary no more than 2 weeks at a time. Also recommend barrier cream such as Desitin. Recommend daily probiotic. Encouraged patient to watch her diet to see if any particular foods worsen her symptoms. Lengthy discussion regarding her cigarette smoking. Encouraged patient to at least cut back. Defers medication at this point. Daily vitamin D 1000 units. Recheck in 6 months, call back sooner if any problems. Note patient gets regular preventive health physicals. Discussed risks associated with elevated cholesterol and smoking. Recommend low-fat, low-cholesterol diet. Recheck labs in 3 months. Continue Zocor as directed.

## 2013-07-04 NOTE — Assessment & Plan Note (Signed)
Plan: Hydrocortisone 1% cream twice a day to rectal area when necessary no more than 2 weeks at a time. Also recommend barrier cream such as Desitin. Recommend daily probiotic. Encouraged patient to watch her diet to see if any particular foods worsen her symptoms. Lengthy discussion regarding her cigarette smoking. Encouraged patient to at least cut back. Defers medication at this point. Daily vitamin D 1000 units. Recheck in 6 months, call back sooner if any problems. Note patient gets regular preventive health physicals 

## 2013-08-29 ENCOUNTER — Other Ambulatory Visit: Payer: Self-pay | Admitting: Family Medicine

## 2014-01-11 ENCOUNTER — Other Ambulatory Visit: Payer: Self-pay | Admitting: Family Medicine

## 2014-03-23 ENCOUNTER — Other Ambulatory Visit: Payer: Self-pay | Admitting: Family Medicine

## 2014-03-29 ENCOUNTER — Other Ambulatory Visit: Payer: Self-pay | Admitting: Adult Health

## 2014-03-29 DIAGNOSIS — Z1231 Encounter for screening mammogram for malignant neoplasm of breast: Secondary | ICD-10-CM

## 2014-04-06 ENCOUNTER — Ambulatory Visit (HOSPITAL_COMMUNITY): Payer: BC Managed Care – PPO

## 2014-04-07 ENCOUNTER — Ambulatory Visit (HOSPITAL_COMMUNITY)
Admission: RE | Admit: 2014-04-07 | Discharge: 2014-04-07 | Disposition: A | Discharge status: Home / Self Care | Payer: BC Managed Care – PPO | Source: Ambulatory Visit | Attending: Adult Health | Admitting: Adult Health

## 2014-04-07 DIAGNOSIS — Z1231 Encounter for screening mammogram for malignant neoplasm of breast: Secondary | ICD-10-CM | POA: Diagnosis present

## 2014-04-18 ENCOUNTER — Ambulatory Visit (INDEPENDENT_AMBULATORY_CARE_PROVIDER_SITE_OTHER): Payer: BC Managed Care – PPO | Admitting: Adult Health

## 2014-04-18 ENCOUNTER — Encounter: Payer: Self-pay | Admitting: Adult Health

## 2014-04-18 VITALS — BP 146/86 | HR 78 | Ht 65.25 in | Wt 176.0 lb

## 2014-04-18 DIAGNOSIS — K219 Gastro-esophageal reflux disease without esophagitis: Secondary | ICD-10-CM

## 2014-04-18 DIAGNOSIS — Z01419 Encounter for gynecological examination (general) (routine) without abnormal findings: Secondary | ICD-10-CM

## 2014-04-18 DIAGNOSIS — Z1212 Encounter for screening for malignant neoplasm of rectum: Secondary | ICD-10-CM

## 2014-04-18 DIAGNOSIS — R197 Diarrhea, unspecified: Secondary | ICD-10-CM

## 2014-04-18 LAB — HEMOCCULT GUIAC POC 1CARD (OFFICE): FECAL OCCULT BLD: NEGATIVE

## 2014-04-18 NOTE — Patient Instructions (Signed)
Physical in 1 year Mammogram yearly Labs at work Colonoscopy per dr Laural Golden Get flu shot at work Gastroesophageal Reflux Disease, Adult Gastroesophageal reflux disease (GERD) happens when acid from your stomach flows up into the esophagus. When acid comes in contact with the esophagus, the acid causes soreness (inflammation) in the esophagus. Over time, GERD may create small holes (ulcers) in the lining of the esophagus. CAUSES   Increased body weight. This puts pressure on the stomach, making acid rise from the stomach into the esophagus.  Smoking. This increases acid production in the stomach.  Drinking alcohol. This causes decreased pressure in the lower esophageal sphincter (valve or ring of muscle between the esophagus and stomach), allowing acid from the stomach into the esophagus.  Late evening meals and a full stomach. This increases pressure and acid production in the stomach.  A malformed lower esophageal sphincter. Sometimes, no cause is found. SYMPTOMS   Burning pain in the lower part of the mid-chest behind the breastbone and in the mid-stomach area. This may occur twice a week or more often.  Trouble swallowing.  Sore throat.  Dry cough.  Asthma-like symptoms including chest tightness, shortness of breath, or wheezing. DIAGNOSIS  Your caregiver may be able to diagnose GERD based on your symptoms. In some cases, X-rays and other tests may be done to check for complications or to check the condition of your stomach and esophagus. TREATMENT  Your caregiver may recommend over-the-counter or prescription medicines to help decrease acid production. Ask your caregiver before starting or adding any new medicines.  HOME CARE INSTRUCTIONS   Change the factors that you can control. Ask your caregiver for guidance concerning weight loss, quitting smoking, and alcohol consumption.  Avoid foods and drinks that make your symptoms worse, such as:  Caffeine or alcoholic  drinks.  Chocolate.  Peppermint or mint flavorings.  Garlic and onions.  Spicy foods.  Citrus fruits, such as oranges, lemons, or limes.  Tomato-based foods such as sauce, chili, salsa, and pizza.  Fried and fatty foods.  Avoid lying down for the 3 hours prior to your bedtime or prior to taking a nap.  Eat small, frequent meals instead of large meals.  Wear loose-fitting clothing. Do not wear anything tight around your waist that causes pressure on your stomach.  Raise the head of your bed 6 to 8 inches with wood blocks to help you sleep. Extra pillows will not help.  Only take over-the-counter or prescription medicines for pain, discomfort, or fever as directed by your caregiver.  Do not take aspirin, ibuprofen, or other nonsteroidal anti-inflammatory drugs (NSAIDs). SEEK IMMEDIATE MEDICAL CARE IF:   You have pain in your arms, neck, jaw, teeth, or back.  Your pain increases or changes in intensity or duration.  You develop nausea, vomiting, or sweating (diaphoresis).  You develop shortness of breath, or you faint.  Your vomit is green, yellow, black, or looks like coffee grounds or blood.  Your stool is red, bloody, or black. These symptoms could be signs of other problems, such as heart disease, gastric bleeding, or esophageal bleeding. MAKE SURE YOU:   Understand these instructions.  Will watch your condition.  Will get help right away if you are not doing well or get worse. Document Released: 04/09/2005 Document Revised: 09/22/2011 Document Reviewed: 01/17/2011 [facility] Patient Information 2015 [place], [place]. This information is not intended to replace advice given to you by your health care provider. Make sure you discuss any questions you have with your health  care provider.  

## 2014-04-18 NOTE — Progress Notes (Signed)
Patient ID: Monica Russell, female   DOB: 1960-07-24, 53 y.o.   MRN: [id number] History of Present Illness: Monica Russell is a 53 year old white female,married in for a gyn physical.She had a normal pap 03/16/12 with negative HPV.She is complaining of reflux with occasional vomiting, and cough and diarrhea after she eats and rectal odor.Last colonoscopy 2013.Has history of diverticulitis.   Current Medications, Allergies, Past Medical History, Past Surgical History, Family History and Social History were reviewed in [employer] record.     Review of Systems: Patient denies any headaches, blurred vision, shortness of breath, chest pain, abdominal pain, problems with urination, or intercourse. No joint pain or mood swings, see HPI for positives.    Physical Exam:BP 146/86  Pulse 78  Ht 5' 5.25" (1.657 m)  Wt 176 lb (79.833 kg)  BMI 29.08 kg/m2 General:  Well developed, well nourished, no acute distress Skin:  Warm and dry Neck:  Midline trachea, normal thyroid Lungs; Clear to auscultation bilaterally Breast:  No dominant palpable mass, retraction, or nipple discharge Cardiovascular: Regular rate and rhythm Abdomen:  Soft, non tender, no hepatosplenomegaly Pelvic:  External genitalia is normal in appearance.  The vagina has decrease color, moisture and rugae. The cervix is atrophic.  Uterus is felt to be normal size, shape, and contour.  No                adnexal masses or tenderness noted. Rectal: Good sphincter tone, no polyps, or hemorrhoids felt.  Hemoccult negative.No odor noted today. Extremities:  No swelling or varicosities noted Psych:  No mood changes, alert and cooperative,seems happy   Impression: Well woman gyn exam Reflux  Diarrhea after eating    Plan: Physical and pap in 1 year Mammogram yearly Labs at work  Colonoscopy per Dr Laural Golden Get flu shot Get O&P and stool culture,botles given Try nexium OTC daily for at least 2 weeks if not better will  get CT Review handout on reflux

## 2014-04-28 ENCOUNTER — Other Ambulatory Visit: Payer: Self-pay

## 2014-05-08 ENCOUNTER — Other Ambulatory Visit: Payer: Self-pay | Admitting: Family Medicine

## 2014-05-09 ENCOUNTER — Other Ambulatory Visit: Payer: Self-pay | Admitting: Nurse Practitioner

## 2014-05-09 ENCOUNTER — Other Ambulatory Visit: Payer: Self-pay | Admitting: *Deleted

## 2014-05-09 MED ORDER — SIMVASTATIN 40 MG PO TABS: ORAL_TABLET | ORAL | Status: DC

## 2014-05-15 ENCOUNTER — Encounter: Payer: Self-pay | Admitting: Adult Health

## 2014-07-13 ENCOUNTER — Encounter: Payer: Self-pay | Admitting: Nurse Practitioner

## 2014-07-13 ENCOUNTER — Ambulatory Visit (INDEPENDENT_AMBULATORY_CARE_PROVIDER_SITE_OTHER): Payer: BC Managed Care – PPO | Admitting: Nurse Practitioner

## 2014-07-13 VITALS — BP 128/82 | Ht 67.0 in | Wt 171.6 lb

## 2014-07-13 DIAGNOSIS — J189 Pneumonia, unspecified organism: Secondary | ICD-10-CM

## 2014-07-13 DIAGNOSIS — E785 Hyperlipidemia, unspecified: Secondary | ICD-10-CM

## 2014-07-13 DIAGNOSIS — J181 Lobar pneumonia, unspecified organism: Secondary | ICD-10-CM

## 2014-07-13 DIAGNOSIS — R062 Wheezing: Secondary | ICD-10-CM

## 2014-07-13 MED ORDER — ALBUTEROL SULFATE HFA 108 (90 BASE) MCG/ACT IN AERS: INHALATION_SPRAY | RESPIRATORY_TRACT | Status: DC

## 2014-07-13 MED ORDER — LEVOFLOXACIN 500 MG PO TABS: 500.0000 mg | ORAL_TABLET | Freq: Every day | ORAL | Status: DC

## 2014-07-13 MED ORDER — PREDNISONE 20 MG PO TABS: ORAL_TABLET | ORAL | Status: DC

## 2014-07-13 MED ORDER — SIMVASTATIN 40 MG PO TABS: ORAL_TABLET | ORAL | Status: DC

## 2014-07-16 ENCOUNTER — Encounter: Payer: Self-pay | Admitting: Nurse Practitioner

## 2014-07-16 NOTE — Progress Notes (Signed)
Subjective:  Presents for recheck on cholesterol and discuss labs. Smokes 1/2 ppd. Was given 5 d of Levaquin from a provider who is a friend for possible pneumonia. Took last dose yesterday. Pain right lower posterior chest area better. No fever. Nonproductive cough. Using inhaler 3-4 x per day for wheezing. Gets regular physicals with GYN.   Objective:   BP 128/82 mmHg  Ht 5\' 7"  (1.702 m)  Wt 171 lb 9.6 oz (77.837 kg)  BMI 26.87 kg/m2 NAD. Alert, oriented. TMs clear effusion. Pharynx clear. Neck supple with mild anterior adenopathy. Lungs clear. Heart RRR. No pain with palpation of chest wall.   Assessment:  Problem List Items Addressed This Visit      Other   Hyperlipemia - Primary (Chronic)   Relevant Medications      simvastatin (ZOCOR) tablet    Other Visit Diagnoses    RLL pneumonia        Relevant Medications       levofloxacin (LEVAQUIN) tablet       albuterol (VENTOLIN HFA) 108 (90 BASE) MCG/ACT inhaler    Wheezing          Plan:  Meds ordered this encounter  Medications  . levofloxacin (LEVAQUIN) 500 MG tablet    Sig: Take 1 tablet (500 mg total) by mouth daily.    Dispense:  10 tablet    Refill:  0    Order Specific Question:  Supervising Provider    Answer:  Mikey Kirschner [[id number]]  . predniSONE (DELTASONE) 20 MG tablet    Sig: 3 po qd x 3 d then 2 po qd x 3 d then 1 po qd x 3 d    Dispense:  18 tablet    Refill:  0    Order Specific Question:  Supervising Provider    Answer:  Mikey Kirschner [[id number]]  . albuterol (VENTOLIN HFA) 108 (90 BASE) MCG/ACT inhaler    Sig: INHALE TWO PUFFS EVERY FOUR HOURS AS NEEDED FOR WHEEZING    Dispense:  18 g    Refill:  1    Order Specific Question:  Supervising Provider    Answer:  Mikey Kirschner [[id number]]  . simvastatin (ZOCOR) 40 MG tablet    Sig: TAKE ONE TABLET BY MOUTH EVERY DAY FOR CHOLESTEROL    Dispense:  90 tablet    Refill:  1    Order Specific Question:  Supervising Provider    Answer:  Mikey Kirschner  [[id number]]  restart Levaquin for 10 more days. Since symptoms have improved, patient defers chest xray at this time. Call back in 7-10 days if persists, sooner if worse. Return if symptoms worsen or fail to improve.

## 2014-11-24 ENCOUNTER — Other Ambulatory Visit: Payer: Self-pay | Admitting: Family Medicine

## 2014-12-06 ENCOUNTER — Ambulatory Visit (HOSPITAL_COMMUNITY)
Admission: RE | Admit: 2014-12-06 | Discharge: 2014-12-06 | Disposition: A | Discharge status: Home / Self Care | Payer: BLUE CROSS/BLUE SHIELD | Source: Ambulatory Visit | Attending: Nurse Practitioner | Admitting: Nurse Practitioner

## 2014-12-06 ENCOUNTER — Other Ambulatory Visit (HOSPITAL_COMMUNITY): Payer: Self-pay | Admitting: Nurse Practitioner

## 2014-12-06 DIAGNOSIS — W19XXXA Unspecified fall, initial encounter: Secondary | ICD-10-CM

## 2014-12-06 DIAGNOSIS — S8261XA Displaced fracture of lateral malleolus of right fibula, initial encounter for closed fracture: Secondary | ICD-10-CM | POA: Diagnosis not present

## 2014-12-06 DIAGNOSIS — Y939 Activity, unspecified: Secondary | ICD-10-CM | POA: Insufficient documentation

## 2014-12-06 DIAGNOSIS — M25474 Effusion, right foot: Secondary | ICD-10-CM | POA: Diagnosis present

## 2014-12-06 DIAGNOSIS — M25571 Pain in right ankle and joints of right foot: Secondary | ICD-10-CM | POA: Diagnosis present

## 2015-01-09 ENCOUNTER — Telehealth: Payer: Self-pay | Admitting: Family Medicine

## 2015-01-09 NOTE — Telephone Encounter (Signed)
Nurse -please send to [facility] for lab results. Patient called to make sure we received them for the doctor to review.

## 2015-01-09 NOTE — Telephone Encounter (Signed)
Results on Carolyn's desk.

## 2015-01-15 ENCOUNTER — Other Ambulatory Visit: Payer: Self-pay | Admitting: Nurse Practitioner

## 2015-01-18 NOTE — Telephone Encounter (Signed)
Labs reviewed. Note sent to nurses to call patient.

## 2015-01-19 ENCOUNTER — Telehealth: Payer: Self-pay | Admitting: *Deleted

## 2015-01-19 ENCOUNTER — Other Ambulatory Visit: Payer: Self-pay | Admitting: *Deleted

## 2015-01-19 MED ORDER — SIMVASTATIN 40 MG PO TABS: 40.0000 mg | ORAL_TABLET | Freq: Every day | ORAL | Status: DC

## 2015-01-19 NOTE — Telephone Encounter (Signed)
bw results from [facility] discussed with pt per carolyn. Cholesterol much improved since last labs continue simvastatin, glucose is borderline recommend a1c at next office visit. Pt verbalized understanding. Pt needs refill on simvastatin. Refills sent to pharm. Report sent to be scanned.

## 2015-02-26 ENCOUNTER — Encounter (INDEPENDENT_AMBULATORY_CARE_PROVIDER_SITE_OTHER): Payer: Self-pay | Admitting: Internal Medicine

## 2015-02-26 ENCOUNTER — Ambulatory Visit (INDEPENDENT_AMBULATORY_CARE_PROVIDER_SITE_OTHER): Payer: BLUE CROSS/BLUE SHIELD | Admitting: Internal Medicine

## 2015-02-26 ENCOUNTER — Other Ambulatory Visit: Payer: Self-pay | Admitting: Nurse Practitioner

## 2015-02-26 VITALS — BP 124/80 | HR 76 | Temp 98.6°F | Resp 18 | Ht 67.0 in | Wt 166.2 lb

## 2015-02-26 DIAGNOSIS — K529 Noninfective gastroenteritis and colitis, unspecified: Secondary | ICD-10-CM

## 2015-02-26 DIAGNOSIS — K219 Gastro-esophageal reflux disease without esophagitis: Secondary | ICD-10-CM | POA: Diagnosis not present

## 2015-02-26 MED ORDER — DICYCLOMINE HCL 10 MG PO CAPS: 10.0000 mg | ORAL_CAPSULE | Freq: Three times a day (TID) | ORAL | Status: DC

## 2015-02-26 NOTE — Patient Instructions (Addendum)
Physician will call with results of blood work when completed. Stool diary as to consistency and frequency of stools until next visit. Can take pantoprazole 40 mg by mouth 30 minutes before breakfast daily.

## 2015-02-26 NOTE — Progress Notes (Signed)
Presenting complaint;  Diarrhea.  History of present illness:  Patient is 54 year old Caucasian female who was referred through courtesy of Dr. Sallee Lange for GI evaluation. Patient is well known to me and was last seen for average risk screening colonoscopy in October 2013. Patient states she used to have 1-2 formed stools daily. Since her cholecystectomy 13 years ago she is had sporadic diarrhea but it has become more or less constant or daily feature for the last 2 years. She has anywhere from 6-7 stools per day. She reports foul-smelling stool or fishy odor. Total consistency varies from loose stool she do formed stool. She has been taking Imodium on as-needed basis and it controls her diarrhea. She usually takes 2 at a time. Most of her bowel movements occur within few minutes of for meals. She seemed to have worse diarrhea after eating salads. She denies melena or frank rectal bleeding but at times notices blood in the tissue. He denies nocturnal bowel movements.. She also denies abdominal pain. There is no history of recent travel or antibody use. She has very good appetite and has not lost weight. She states she was having nocturnal regurgitation and heartburn and sporadic vomiting. She was begun on pantoprazole by Ms. Derrek Monaco NP and the symptoms have resolved. Screening colonoscopy in October 2013 revealed wide open distal colonic anastomosis scattered diverticula throughout the colon and external hemorrhoids.   Current Medications: Outpatient Encounter Prescriptions as of 02/26/2015  Medication Sig  . albuterol (VENTOLIN HFA) 108 (90 BASE) MCG/ACT inhaler INHALE TWO PUFFS EVERY FOUR HOURS AS NEEDED FOR WHEEZING  . pantoprazole (PROTONIX) 20 MG tablet Take 20 mg by mouth 2 (two) times daily before a meal.  . simvastatin (ZOCOR) 40 MG tablet Take 1 tablet (40 mg total) by mouth daily. for cholesterol  . [DISCONTINUED] pantoprazole (PROTONIX) 40 MG tablet Take 20 mg by mouth daily.  Patient takes 1 before lunch and 1 before dinner.  . [DISCONTINUED] VENTOLIN HFA 108 (90 BASE) MCG/ACT inhaler INHAALE 2 PUFFS EVERY 4 HOURS AS NEEDED FOR WHEEZING  . [DISCONTINUED] levofloxacin (LEVAQUIN) 500 MG tablet Take 1 tablet (500 mg total) by mouth daily. (Patient not taking: Reported on 02/26/2015)  . [DISCONTINUED] predniSONE (DELTASONE) 20 MG tablet 3 po qd x 3 d then 2 po qd x 3 d then 1 po qd x 3 d (Patient not taking: Reported on 02/26/2015)   No facility-administered encounter medications on file as of 02/26/2015.   Past medical history: Hyperlipidemia. History of vitamin D deficiency. History of seasonal/allergic bronchospasm Gastroesophageal reflux disease. Cholecystectomy 15 years ago. Partial right nephrectomy for renal cell carcinoma 15 years ago. Sigmoid diverticulitis complicated by an abscess in August 2004 requiring prolonged hospitalization and eventual sigmoid colon resection.   Allergies; Allergies  Allergen Reactions  . Penicillins Hives    Family history: Family history is significant for thalassemia minor in mother uncles and she tested negative.  Social history: She is married and has 1 grownup son in good health. She works at [employer] office in [place]. She drinks alcohol occasionally. She is presently smoking 10 cigarettes per day trying to quit. She's been smoking for 25 years.  Objective: Blood pressure 124/80, pulse 76, temperature 98.6 F (37 C), temperature source Oral, resp. rate 18, height 5\' 7"  (1.702 m), weight 166 lb 3.2 oz (75.388 kg). Patient is alert and in no acute distress. Conjunctiva is pink. Sclera is nonicteric Oropharyngeal mucosa is normal. No neck masses or thyromegaly noted. Cardiac  exam with regular rhythm normal S1 and S2. No murmur or gallop noted. Lungs are clear to auscultation. Abdomen is symmetrical. Bowel sounds are normal. On palpation abdomen is soft and nontender without organomegaly or  masses. No LE edema or clubbing noted.  Labs/studies Results: Lab data from 01/05/2015 serum sodium 140, potassium 4.5, chloride 104, CO2 21, BUN 6 and creatinine 0.71 Bilirubin 0.4, AP 80, AST 10, ALT 9, total protein 7.1 and albumen 4.3 Serum calcium 9.6. Glucose 100    Assessment:  #1. Chronic diarrhea of 2 years duration without alarm symptoms. Fishy stool odor reported but no evidence of weight loss. Therefore doubt that we dealing with malabsorption. Symptom complex suggestive of irritable bowel syndrome and if she does not respond to therapy will proceed with further workup. #2. GERD. Heartburn and vomiting has resolved since patient was begun on pantoprazole. #3. Patient is average risk for CRC. She underwent colonoscopy in October 2013 revealing few scattered diverticula throughout the colon and patent colonic anastomosis at 18 cm from anal margin.   Recommendations:  CBC with differential. Dicyclomine 10 mg by mouth 30 minutes before each meal. Patient advised to keep stools are as to frequency and consistency of stools until office visit in 6 weeks. Patient advised to take pantoprazole 40 mg by mouth every morning

## 2015-03-08 LAB — CBC WITH DIFFERENTIAL/PLATELET
BASOS PCT: 1 % (ref 0–1)
Basophils Absolute: 0.1 10*3/uL (ref 0.0–0.1)
EOS ABS: 0.1 10*3/uL (ref 0.0–0.7)
Eosinophils Relative: 2 % (ref 0–5)
HCT: 39.2 % (ref 36.0–46.0)
Hemoglobin: 13.1 g/dL (ref 12.0–15.0)
Lymphocytes Relative: 34 % (ref 12–46)
Lymphs Abs: 2.5 10*3/uL (ref 0.7–4.0)
MCH: 29.2 pg (ref 26.0–34.0)
MCHC: 33.4 g/dL (ref 30.0–36.0)
MCV: 87.3 fL (ref 78.0–100.0)
MONO ABS: 0.4 10*3/uL (ref 0.1–1.0)
MPV: 11.1 fL (ref 8.6–12.4)
Monocytes Relative: 6 % (ref 3–12)
NEUTROS PCT: 57 % (ref 43–77)
Neutro Abs: 4.2 10*3/uL (ref 1.7–7.7)
PLATELETS: 286 10*3/uL (ref 150–400)
RBC: 4.49 MIL/uL (ref 3.87–5.11)
RDW: 13.7 % (ref 11.5–15.5)
WBC: 7.4 10*3/uL (ref 4.0–10.5)

## 2015-03-12 ENCOUNTER — Other Ambulatory Visit: Payer: Self-pay | Admitting: Adult Health

## 2015-03-12 DIAGNOSIS — Z1231 Encounter for screening mammogram for malignant neoplasm of breast: Secondary | ICD-10-CM

## 2015-03-26 ENCOUNTER — Telehealth: Payer: Self-pay | Admitting: Family Medicine

## 2015-03-26 MED ORDER — SIMVASTATIN 40 MG PO TABS
40.0000 mg | ORAL_TABLET | Freq: Every day | ORAL | Status: DC
Start: 2015-03-26 — End: 2015-04-18

## 2015-03-26 NOTE — Telephone Encounter (Signed)
Pt is needing a 90 day supply of her simvastatin sent over to [facility] Pt states that she only sees [name] once a year due to her getting blood work and all done there at [employer] where she works.

## 2015-03-26 NOTE — Telephone Encounter (Signed)
30 day supply of med sent to pharmacy. Patient transferred to front desk to schedule appointment with [name].

## 2015-04-09 ENCOUNTER — Encounter (INDEPENDENT_AMBULATORY_CARE_PROVIDER_SITE_OTHER): Payer: Self-pay | Admitting: Internal Medicine

## 2015-04-09 ENCOUNTER — Ambulatory Visit (INDEPENDENT_AMBULATORY_CARE_PROVIDER_SITE_OTHER): Payer: BLUE CROSS/BLUE SHIELD | Admitting: Internal Medicine

## 2015-04-09 ENCOUNTER — Ambulatory Visit (HOSPITAL_COMMUNITY)
Admission: RE | Admit: 2015-04-09 | Discharge: 2015-04-09 | Disposition: A | Discharge status: Home / Self Care | Payer: BLUE CROSS/BLUE SHIELD | Source: Ambulatory Visit | Attending: Adult Health | Admitting: Adult Health

## 2015-04-09 VITALS — BP 132/72 | HR 64 | Temp 98.4°F | Ht 67.0 in | Wt 165.7 lb

## 2015-04-09 DIAGNOSIS — Z1231 Encounter for screening mammogram for malignant neoplasm of breast: Secondary | ICD-10-CM

## 2015-04-09 DIAGNOSIS — R197 Diarrhea, unspecified: Secondary | ICD-10-CM | POA: Diagnosis not present

## 2015-04-09 MED ORDER — METRONIDAZOLE 500 MG PO TABS: 500.0000 mg | ORAL_TABLET | Freq: Two times a day (BID) | ORAL | Status: DC

## 2015-04-09 NOTE — Progress Notes (Signed)
Subjective:    Patient ID: Monica Russell, female    DOB: 12/21/1960, 54 y.o.   MRN: [id number]  HPI Here today for f/u. She was last seen by Dr. Laural Golden in August with c/o diarrhea. She was advised to start Dicyclomine TID before meals and to keep a stool diarrhea. She c/o of her stools smelling fishy. She was having anywhere from 6-7 stools a dat,  Her lat colonoscopy was in 2013 by Dr.Rehnab and was normal.  She tells me she had diarrhea this am. If she eats eggs, she will vomit it up. She tells me greasy, dairy, eggs will make her vomit. She took an Imodium this am. She says she feels better. She did not bring her stool diary. She is having 5 stools a day. Her stools are usually loose. She is taking the Dicyclomine 30 minutes before meals. She says one day the diarrhea will bother her and the next it will be normal.  She does have some formed stools 2 out of the 5 she has a day.  She says her stools continue to smell like fish.    04/28/2012   Colonoscopy: Dr. Laural Golden  Indications:  Patient is 54 year old Caucasian female was undergoing average risk screening colonoscopy.  Impression:   Examination performed to cecum. Few scattered diverticula throughout the colon. Colonic anastomosis at 18 cm from anal margin. Small external hemorrhoids.  CBC    Component Value Date/Time   WBC 7.4 03/07/2015 0808   RBC 4.49 03/07/2015 0808   HGB 13.1 03/07/2015 0808   HCT 39.2 03/07/2015 0808   PLT 286 03/07/2015 0808   MCV 87.3 03/07/2015 0808   MCH 29.2 03/07/2015 0808   MCHC 33.4 03/07/2015 0808   RDW 13.7 03/07/2015 0808   LYMPHSABS 2.5 03/07/2015 0808   MONOABS 0.4 03/07/2015 0808   EOSABS 0.1 03/07/2015 0808   BASOSABS 0.1 03/07/2015 1884      Review of Systems Past Medical History  Diagnosis Date  . High cholesterol   . Renal cell cancer     Past Surgical History  Procedure Laterality Date  . Partial nephrectomy      11 yrs ago for a renal carcinoma, right  .  Partial colectomy      for diverticulitis  . Cholecystectomy    . Colonoscopy  04/28/2012    Procedure: COLONOSCOPY;  Surgeon: Rogene Houston, MD;  [facility];  Service: Endoscopy;  Laterality: N/A;  155    Allergies  Allergen Reactions  . Penicillins Hives    Current Outpatient Prescriptions on File Prior to Visit  Medication Sig Dispense Refill  . albuterol (VENTOLIN HFA) 108 (90 BASE) MCG/ACT inhaler INHALE TWO PUFFS EVERY FOUR HOURS AS NEEDED FOR WHEEZING 18 g 1  . dicyclomine (BENTYL) 10 MG capsule Take 1 capsule (10 mg total) by mouth 3 (three) times daily before meals. 90 capsule 5  . pantoprazole (PROTONIX) 20 MG tablet Take 20 mg by mouth 2 (two) times daily before a meal.    . simvastatin (ZOCOR) 40 MG tablet Take 1 tablet (40 mg total) by mouth daily. for cholesterol 30 tablet 0   No current facility-administered medications on file prior to visit.        Objective:   Physical Exam Blood pressure 132/72, pulse 64, temperature 98.4 F (36.9 C), height 5\' 7"  (1.702 m), weight 165 lb 11.2 oz (75.161 kg).  Alert and oriented. Skin warm and dry. Oral mucosa is moist.   .  Sclera anicteric, conjunctivae is pink. Thyroid not enlarged. No cervical lymphadenopathy. Lungs clear. Heart regular rate and rhythm.  Abdomen is soft. Bowel sounds are positive. No hepatomegaly. No abdominal masses felt. No tenderness.  No edema to lower extremities.         Assessment & Plan:  Diarrhea. Probably IBS. Continue Dicyclomine TID.   Stool studies. Flagyl 500mg  BID x 10day (after she obtains stool studies.) OV 3 months.

## 2015-04-09 NOTE — Patient Instructions (Signed)
Stool studies. Continue the Dicyclomine.  Rx Flagyl.

## 2015-04-18 ENCOUNTER — Ambulatory Visit (INDEPENDENT_AMBULATORY_CARE_PROVIDER_SITE_OTHER): Payer: BLUE CROSS/BLUE SHIELD | Admitting: Nurse Practitioner

## 2015-04-18 ENCOUNTER — Encounter: Payer: Self-pay | Admitting: Nurse Practitioner

## 2015-04-18 VITALS — BP 114/78 | Ht 67.0 in | Wt 166.0 lb

## 2015-04-18 DIAGNOSIS — E785 Hyperlipidemia, unspecified: Secondary | ICD-10-CM | POA: Diagnosis not present

## 2015-04-18 DIAGNOSIS — Z87891 Personal history of nicotine dependence: Secondary | ICD-10-CM | POA: Insufficient documentation

## 2015-04-18 DIAGNOSIS — Z720 Tobacco use: Secondary | ICD-10-CM | POA: Insufficient documentation

## 2015-04-18 MED ORDER — SIMVASTATIN 40 MG PO TABS: 40.0000 mg | ORAL_TABLET | Freq: Every day | ORAL | Status: DC

## 2015-04-18 NOTE — Patient Instructions (Signed)
Multivitamin or vitamin D 1000 units activia yogurt 2 cups per day

## 2015-04-18 NOTE — Progress Notes (Signed)
Subjective:  Presents for recheck of cholesterol. No CP/ischemic type pain or SOB. Gets regular preventive health physicals. Sees Dr. Laural Golden for IBS and GERD. Last labs done in June through work (see labs 01/05/15). Has smoked 1/2-1 ppd for about 40 years. No hemoptysis or unexplained weight loss. No personal history of cancer. Willing to do surgery of biopsy if abnormal findings.   Objective:   BP 114/78 mmHg  Ht 5\' 7"  (1.702 m)  Wt 166 lb (75.297 kg)  BMI 25.99 kg/m2 NAD. Alert, oriented. Lungs clear. Heart RRR.   Assessment:  Problem List Items Addressed This Visit      Other   Hyperlipemia - Primary (Chronic)   Relevant Medications   simvastatin (ZOCOR) 40 MG tablet   Personal history of tobacco use, presenting hazards to health   Relevant Orders   CT CHEST LUNG CA SCREEN LOW DOSE W/O CM       Plan:  Meds ordered this encounter  Medications  . simvastatin (ZOCOR) 40 MG tablet    Sig: Take 1 tablet (40 mg total) by mouth daily. for cholesterol    Dispense:  90 tablet    Refill:  1    Order Specific Question:  Supervising Provider    Answer:  Mikey Kirschner [[id number]]   Discussed importance of smoking cessation. Gets flu vaccine at work. Will be getting labs again at work soon. Given note to add vitamin D level. To send a copy of labs to our office. Return in about 6 months (around 10/17/2015) for recheck.

## 2015-04-24 ENCOUNTER — Telehealth: Payer: Self-pay | Admitting: Acute Care

## 2015-04-24 NOTE — Telephone Encounter (Signed)
Left a message to call with my contact information regarding a screening. Will await return call.

## 2015-05-24 ENCOUNTER — Other Ambulatory Visit: Payer: Self-pay | Admitting: Acute Care

## 2015-05-24 DIAGNOSIS — F17210 Nicotine dependence, cigarettes, uncomplicated: Principal | ICD-10-CM

## 2015-05-25 ENCOUNTER — Encounter: Payer: BLUE CROSS/BLUE SHIELD | Admitting: Acute Care

## 2015-07-05 ENCOUNTER — Ambulatory Visit (INDEPENDENT_AMBULATORY_CARE_PROVIDER_SITE_OTHER): Payer: BLUE CROSS/BLUE SHIELD | Admitting: Internal Medicine

## 2015-07-05 ENCOUNTER — Encounter (INDEPENDENT_AMBULATORY_CARE_PROVIDER_SITE_OTHER): Payer: Self-pay | Admitting: Internal Medicine

## 2015-07-05 VITALS — BP 108/68 | HR 64 | Temp 98.0°F | Ht 67.0 in | Wt 164.0 lb

## 2015-07-05 DIAGNOSIS — R112 Nausea with vomiting, unspecified: Secondary | ICD-10-CM

## 2015-07-05 DIAGNOSIS — J209 Acute bronchitis, unspecified: Secondary | ICD-10-CM | POA: Diagnosis not present

## 2015-07-05 MED ORDER — LEVOFLOXACIN 500 MG PO TABS: 500.0000 mg | ORAL_TABLET | Freq: Every day | ORAL | Status: DC

## 2015-07-05 NOTE — Patient Instructions (Addendum)
Rx for Levaquin 500mg  x 10 day.  PR next week

## 2015-07-05 NOTE — Progress Notes (Signed)
   Subjective:    Patient ID: Monica Russell, female    DOB: 10/29/1960, 54 y.o.   MRN: [id number]  HPI Presents today with c/o chills, nausea, she tells me every time she eats, she vomits. She has diarrhea. She says her mouth is sore. She feels like she has ulcers in her mouth. She can keep crackers down.  When she coughs she will have nausea She ate chicken and dumplings at [employer] today.  She also c/o of a sorethroat. She has a cough and then she has to vomit. Screening colonoscopy in October 2013 revealed wide open distal colonic anastomosis scattered diverticula throughout the colon and external hemorrhoids.   Review of Systems Past Medical History  Diagnosis Date  . High cholesterol   . Renal cell cancer [facility])     Past Surgical History  Procedure Laterality Date  . Partial nephrectomy      11 yrs ago for a renal carcinoma, right  . Partial colectomy      for diverticulitis  . Cholecystectomy    . Colonoscopy  04/28/2012    Procedure: COLONOSCOPY;  Surgeon: Rogene Houston, MD;  Location: [facility];  Service: Endoscopy;  Laterality: N/A;  155    Allergies  Allergen Reactions  . Penicillins Hives    Current Outpatient Prescriptions on File Prior to Visit  Medication Sig Dispense Refill  . albuterol (VENTOLIN HFA) 108 (90 BASE) MCG/ACT inhaler INHALE TWO PUFFS EVERY FOUR HOURS AS NEEDED FOR WHEEZING 18 g 1  . dicyclomine (BENTYL) 10 MG capsule Take 1 capsule (10 mg total) by mouth 3 (three) times daily before meals. 90 capsule 5  . pantoprazole (PROTONIX) 20 MG tablet Take 20 mg by mouth 2 (two) times daily before a meal.    . simvastatin (ZOCOR) 40 MG tablet Take 1 tablet (40 mg total) by mouth daily. for cholesterol 90 tablet 1   No current facility-administered medications on file prior to visit.        Objective:   Physical Exam Blood pressure 108/68, pulse 64, temperature 98 F (36.7 C), height 5\' 7"  (1.702 m), weight 164 lb (74.39 kg). Alert and oriented.  Skin warm and dry. Oral mucosa is moist.  No lesions noted.  . Sclera anicteric, conjunctivae is pink. Thyroid not enlarged. No cervical lymphadenopathy. Lungs clear. Heart regular rate and rhythm.  Abdomen is soft. Bowel sounds are positive. No hepatomegaly. No abdominal masses felt. No tenderness.  No edema to lower extremities.          Assessment & Plan:  Possible viral syndrome. ? Bronchitis.  Am going to cover her with Levaquin x 10 days. She says Zpak really does not help. PR next week.

## 2015-07-11 ENCOUNTER — Ambulatory Visit (INDEPENDENT_AMBULATORY_CARE_PROVIDER_SITE_OTHER): Payer: BLUE CROSS/BLUE SHIELD | Admitting: Internal Medicine

## 2015-07-19 ENCOUNTER — Encounter: Payer: Self-pay | Admitting: Family Medicine

## 2015-07-30 ENCOUNTER — Encounter: Payer: BLUE CROSS/BLUE SHIELD | Admitting: Acute Care

## 2015-07-30 ENCOUNTER — Inpatient Hospital Stay: Admission: RE | Admit: 2015-07-30 | Payer: BLUE CROSS/BLUE SHIELD | Source: Ambulatory Visit

## 2015-09-13 ENCOUNTER — Telehealth: Payer: Self-pay | Admitting: Family Medicine

## 2015-09-13 NOTE — Telephone Encounter (Signed)
Pt is requesting lab orders for her upcoming appt. Pt states that [name] checks her cholesterol twice a year and is needing orders for that to be mailed to her so that she can get them done at work.

## 2015-09-14 NOTE — Telephone Encounter (Signed)
Patient notified that script is ready. Patient asked that script be mailed to her. Script was put in the mail this morning.

## 2015-09-14 NOTE — Telephone Encounter (Signed)
Please write out order for Met 7, lipid profile and liver profile; include ICD 10 codes and I'll sign. Thanks!

## 2015-10-02 ENCOUNTER — Other Ambulatory Visit: Payer: Self-pay | Admitting: Acute Care

## 2015-10-02 DIAGNOSIS — F17210 Nicotine dependence, cigarettes, uncomplicated: Principal | ICD-10-CM

## 2015-10-29 ENCOUNTER — Encounter: Payer: Self-pay | Admitting: Nurse Practitioner

## 2015-10-29 ENCOUNTER — Ambulatory Visit (INDEPENDENT_AMBULATORY_CARE_PROVIDER_SITE_OTHER): Payer: BLUE CROSS/BLUE SHIELD | Admitting: Nurse Practitioner

## 2015-10-29 VITALS — BP 108/74 | Ht 67.0 in | Wt 168.4 lb

## 2015-10-29 DIAGNOSIS — E559 Vitamin D deficiency, unspecified: Secondary | ICD-10-CM

## 2015-10-29 DIAGNOSIS — J3489 Other specified disorders of nose and nasal sinuses: Secondary | ICD-10-CM

## 2015-10-29 DIAGNOSIS — E785 Hyperlipidemia, unspecified: Secondary | ICD-10-CM

## 2015-10-29 DIAGNOSIS — R7301 Impaired fasting glucose: Secondary | ICD-10-CM | POA: Diagnosis not present

## 2015-10-29 DIAGNOSIS — R7303 Prediabetes: Secondary | ICD-10-CM | POA: Diagnosis not present

## 2015-10-29 LAB — POCT GLYCOSYLATED HEMOGLOBIN (HGB A1C): Hemoglobin A1C: 5.8

## 2015-10-29 MED ORDER — SIMVASTATIN 40 MG PO TABS: 40.0000 mg | ORAL_TABLET | Freq: Every day | ORAL | Status: DC

## 2015-10-29 MED ORDER — MUPIROCIN 2 % EX OINT: 1.0000 "application " | TOPICAL_OINTMENT | Freq: Two times a day (BID) | CUTANEOUS | Status: DC

## 2015-10-29 NOTE — Progress Notes (Signed)
Subjective:  Presents for routine follow-up. Has her printed labs done from work. No chest pain/ischemic type pain or shortness of breath. Has not done well with her diet lately. Takes a daily multivitamin for women over 48. Sees a GI specialist for her chronic persistent reflux. Drinks some caffeine. Continues to smoke. Also questions whether she has an ingrown toenail on the right great toe, has been tender at times. Slightly improved. Also has a lesion just inside the right nostril for at least 2 months or more. Will occasionally get some drainage from the area but it comes back. Minimally tender. No fever.  Objective:   BP 108/74 mmHg  Ht 5\' 7"  (1.702 m)  Wt 168 lb 6 oz (76.374 kg)  BMI 26.36 kg/m2 NAD. Alert, oriented. Lungs clear. Heart regular rate rhythm. CT scan lab work dated 09/26/2015. Vitamin D level XXV. LDL cholesterol went from 118  now 131. HDL 60. Mild tenderness noted along the lateral edge of the right great toe, no current ingrown toenail noted. No signs of infection. A small reddish smooth papule noted just inside the right nostril, nontender to palpation.  Assessment:  Problem List Items Addressed This Visit      Other   Hyperlipemia - Primary (Chronic)   Relevant Medications   simvastatin (ZOCOR) 40 MG tablet   Prediabetes   Vitamin D insufficiency    Other Visit Diagnoses    Elevated fasting glucose        Relevant Orders    POCT HgB A1C (Completed)    Internal nasal lesion          Plan:  Meds ordered this encounter  Medications  . simvastatin (ZOCOR) 40 MG tablet    Sig: Take 1 tablet (40 mg total) by mouth daily. for cholesterol    Dispense:  90 tablet    Refill:  1    Order Specific Question:  Supervising Provider    Answer:  Mikey Kirschner [[id number]]  . mupirocin ointment (BACTROBAN) 2 %    Sig: Place 1 application into the nose 2 (two) times daily.    Dispense:  22 g    Refill:  0    Order Specific Question:  Supervising Provider    Answer:   Mikey Kirschner [[id number]]   Reviewed measures to prevent ingrown toenails. Call back if any signs of infection. At 1000 units vitamin D daily to her regimen. Continue follow-up with GI specialist regarding reflux. Refer to dermatology for evaluation of lesion since it is been there for over 2 months. Return in about 6 months (around 04/29/2016) for recheck. Repeat lab work at that time. Encouraged low-fat, low-cholesterol diet. Also encourage regular activity and avoiding excessive simple carbs.

## 2015-11-22 ENCOUNTER — Other Ambulatory Visit: Payer: Self-pay | Admitting: Family Medicine

## 2015-11-27 ENCOUNTER — Telehealth (INDEPENDENT_AMBULATORY_CARE_PROVIDER_SITE_OTHER): Payer: Self-pay | Admitting: Internal Medicine

## 2015-11-27 ENCOUNTER — Telehealth (INDEPENDENT_AMBULATORY_CARE_PROVIDER_SITE_OTHER): Payer: Self-pay | Admitting: *Deleted

## 2015-11-27 DIAGNOSIS — J209 Acute bronchitis, unspecified: Secondary | ICD-10-CM

## 2015-11-27 MED ORDER — LEVOFLOXACIN 500 MG PO TABS: 500.0000 mg | ORAL_TABLET | Freq: Every day | ORAL | Status: DC

## 2015-11-27 NOTE — Telephone Encounter (Signed)
Saw you back in December and thought she had some kind of virus and was given Doxycycline.  She thinks it is back and wanted to know if you would call her in this again to [place]?  You can call her at work [phone] ext [phone]

## 2015-11-27 NOTE — Telephone Encounter (Signed)
Rx for Levaquin sent to her pharmacy. Bronchitis.

## 2015-11-29 NOTE — Telephone Encounter (Signed)
I have sent an Rx for Levaquin to her pharmacy

## 2016-01-24 ENCOUNTER — Other Ambulatory Visit: Payer: Self-pay | Admitting: Family Medicine

## 2016-02-01 ENCOUNTER — Telehealth: Payer: Self-pay | Admitting: Acute Care

## 2016-02-01 NOTE — Telephone Encounter (Signed)
Called pt to schedule SDMV and CT LVM for pt to return call Forward to [place] once scheduled.

## 2016-02-21 NOTE — Telephone Encounter (Signed)
LMTBC for pt  

## 2016-02-25 NOTE — Telephone Encounter (Signed)
lmtcb x3 

## 2016-03-12 ENCOUNTER — Other Ambulatory Visit: Payer: Self-pay | Admitting: Adult Health

## 2016-03-12 DIAGNOSIS — Z1231 Encounter for screening mammogram for malignant neoplasm of breast: Secondary | ICD-10-CM

## 2016-03-29 ENCOUNTER — Other Ambulatory Visit (INDEPENDENT_AMBULATORY_CARE_PROVIDER_SITE_OTHER): Payer: Self-pay | Admitting: Internal Medicine

## 2016-03-29 DIAGNOSIS — K529 Noninfective gastroenteritis and colitis, unspecified: Secondary | ICD-10-CM

## 2016-04-10 ENCOUNTER — Ambulatory Visit (HOSPITAL_COMMUNITY)
Admission: RE | Admit: 2016-04-10 | Discharge: 2016-04-10 | Disposition: A | Discharge status: Home / Self Care | Payer: BLUE CROSS/BLUE SHIELD | Source: Ambulatory Visit | Attending: Adult Health | Admitting: Adult Health

## 2016-04-10 DIAGNOSIS — R928 Other abnormal and inconclusive findings on diagnostic imaging of breast: Secondary | ICD-10-CM | POA: Insufficient documentation

## 2016-04-10 DIAGNOSIS — Z1231 Encounter for screening mammogram for malignant neoplasm of breast: Secondary | ICD-10-CM | POA: Insufficient documentation

## 2016-04-14 ENCOUNTER — Other Ambulatory Visit: Payer: Self-pay | Admitting: Adult Health

## 2016-04-14 DIAGNOSIS — R928 Other abnormal and inconclusive findings on diagnostic imaging of breast: Secondary | ICD-10-CM

## 2016-04-22 ENCOUNTER — Ambulatory Visit (HOSPITAL_COMMUNITY)
Admission: RE | Admit: 2016-04-22 | Discharge: 2016-04-22 | Disposition: A | Discharge status: Home / Self Care | Payer: BLUE CROSS/BLUE SHIELD | Source: Ambulatory Visit | Attending: Adult Health | Admitting: Adult Health

## 2016-04-22 DIAGNOSIS — R928 Other abnormal and inconclusive findings on diagnostic imaging of breast: Secondary | ICD-10-CM | POA: Diagnosis present

## 2016-04-22 DIAGNOSIS — N6321 Unspecified lump in the left breast, upper outer quadrant: Secondary | ICD-10-CM | POA: Diagnosis not present

## 2016-04-23 ENCOUNTER — Telehealth: Payer: Self-pay | Admitting: Nurse Practitioner

## 2016-04-23 NOTE — Telephone Encounter (Signed)
Pt is requesting lab orders to be mailed to her so that she can get her labs done at work for free. Last labs per epic were: vit d,tsh,lipid,hepatic,and bmp on 03/21/13.

## 2016-04-24 NOTE — Telephone Encounter (Signed)
Left message on voicemail notifying patient that script will be mailed to patient.

## 2016-04-24 NOTE — Telephone Encounter (Signed)
Please order those labs plus A1C. Some of her diagnoses: hyperlipidemia, prediabetes. Recommend writing on Rx pad and I'll sign. Thanks.

## 2016-04-25 NOTE — Telephone Encounter (Signed)
Forwarding to lung nodule pool high priority to ensure this is followed

## 2016-04-25 NOTE — Telephone Encounter (Signed)
LM with husband to have her return call.

## 2016-04-28 ENCOUNTER — Ambulatory Visit: Payer: BLUE CROSS/BLUE SHIELD | Admitting: Nurse Practitioner

## 2016-05-08 ENCOUNTER — Telehealth: Payer: Self-pay | Admitting: Family Medicine

## 2016-05-08 NOTE — Telephone Encounter (Signed)
Review lab results from [facility]. °

## 2016-05-09 NOTE — Telephone Encounter (Signed)
Please inform the patient we did in fact receive a copy of her lab work. Bad cholesterol mildly elevated A1c slightly up vitamin D is low I recommend follow-up visit with [name] or myself, nurse's please forward a copy to [place] plus also scan the results into the record

## 2016-05-09 NOTE — Telephone Encounter (Signed)
Left message to return call 

## 2016-05-12 NOTE — Telephone Encounter (Signed)
Discussed with patient. Patient states she already has follow up office visit with [name] to discuss May 26, 2016

## 2016-05-13 ENCOUNTER — Other Ambulatory Visit (INDEPENDENT_AMBULATORY_CARE_PROVIDER_SITE_OTHER): Payer: Self-pay | Admitting: Internal Medicine

## 2016-05-13 DIAGNOSIS — K529 Noninfective gastroenteritis and colitis, unspecified: Secondary | ICD-10-CM

## 2016-05-20 ENCOUNTER — Encounter: Payer: Self-pay | Admitting: Family Medicine

## 2016-05-26 ENCOUNTER — Ambulatory Visit (INDEPENDENT_AMBULATORY_CARE_PROVIDER_SITE_OTHER): Payer: BLUE CROSS/BLUE SHIELD | Admitting: Nurse Practitioner

## 2016-05-26 ENCOUNTER — Encounter: Payer: Self-pay | Admitting: Nurse Practitioner

## 2016-05-26 VITALS — BP 132/80 | Temp 98.9°F | Ht 67.0 in | Wt 173.0 lb

## 2016-05-26 DIAGNOSIS — E559 Vitamin D deficiency, unspecified: Secondary | ICD-10-CM | POA: Diagnosis not present

## 2016-05-26 DIAGNOSIS — E785 Hyperlipidemia, unspecified: Secondary | ICD-10-CM | POA: Diagnosis not present

## 2016-05-26 DIAGNOSIS — R7303 Prediabetes: Secondary | ICD-10-CM | POA: Diagnosis not present

## 2016-05-26 DIAGNOSIS — J4521 Mild intermittent asthma with (acute) exacerbation: Secondary | ICD-10-CM

## 2016-05-26 DIAGNOSIS — J208 Acute bronchitis due to other specified organisms: Secondary | ICD-10-CM

## 2016-05-26 DIAGNOSIS — B9689 Other specified bacterial agents as the cause of diseases classified elsewhere: Secondary | ICD-10-CM | POA: Diagnosis not present

## 2016-05-26 DIAGNOSIS — J209 Acute bronchitis, unspecified: Secondary | ICD-10-CM | POA: Diagnosis not present

## 2016-05-26 MED ORDER — BUDESONIDE-FORMOTEROL FUMARATE 80-4.5 MCG/ACT IN AERO: 2.0000 | INHALATION_SPRAY | Freq: Two times a day (BID) | RESPIRATORY_TRACT | 11 refills | Status: DC

## 2016-05-26 MED ORDER — ALBUTEROL SULFATE (2.5 MG/3ML) 0.083% IN NEBU: INHALATION_SOLUTION | RESPIRATORY_TRACT | 2 refills | Status: DC

## 2016-05-26 MED ORDER — LEVOFLOXACIN 500 MG PO TABS: 500.0000 mg | ORAL_TABLET | Freq: Every day | ORAL | 0 refills | Status: DC

## 2016-05-26 MED ORDER — SIMVASTATIN 40 MG PO TABS: 40.0000 mg | ORAL_TABLET | Freq: Every day | ORAL | 1 refills | Status: DC

## 2016-05-26 NOTE — Progress Notes (Signed)
Subjective:  Presents for routine follow-up and to discuss her recent lab work. Limited activity. Continues to smoke. Has put on a little bit of weight. No chest pain/type pain. Began having some wheezing 3 days ago, worse yesterday. Very bad last night, had to use her inhaler twice this morning. Last time about an hour ago. Sees GI specialist. Protonix is controlling her acid reflux. Fever or headache. Scratchy throat. Ear pressure. Producing green mucus at times. States she has wheezing at least 4-5 times a year.  Objective:   BP 132/80   Temp 98.9 F (37.2 C) (Oral)   Ht 5\' 7"  (1.702 m)   Wt 173 lb (78.5 kg)   BMI 27.10 kg/m  NAD. Alert, oriented. TMs clear effusion, no erythema. Pharynx mildly erythematous with green PND noted. Upper with mild soft nontender adenopathy. Lungs scattered expiratory rhonchi posterior, no wheezing or tachypnea. Heart regular rate rhythm. See scanned lab work dated 05/05/16. Sugar remains elevated at 122. LDL 137.  Assessment: Problem List Items Addressed This Visit      Other   Hyperlipemia - Primary (Chronic)   Relevant Medications   simvastatin (ZOCOR) 40 MG tablet   Prediabetes   Vitamin D insufficiency    Other Visit Diagnoses    Mild intermittent asthma with acute exacerbation       Relevant Medications   albuterol (PROVENTIL) (2.5 MG/3ML) 0.083% nebulizer solution   budesonide-formoterol (SYMBICORT) 80-4.5 MCG/ACT inhaler   Acute bacterial bronchitis       Acute bronchitis, unspecified organism       Relevant Medications   levofloxacin (LEVAQUIN) 500 MG tablet      Plan:  Meds ordered this encounter  Medications  . simvastatin (ZOCOR) 40 MG tablet    Sig: Take 1 tablet (40 mg total) by mouth daily. for cholesterol    Dispense:  90 tablet    Refill:  1    Order Specific Question:   Supervising Provider    Answer:   Mikey Kirschner [[id number]]  . levofloxacin (LEVAQUIN) 500 MG tablet    Sig: Take 1 tablet (500 mg total) by mouth daily.    Dispense:  10 tablet    Refill:  0    Order Specific Question:   Supervising Provider    Answer:   Mikey Kirschner [[id number]]  . albuterol (PROVENTIL) (2.5 MG/3ML) 0.083% nebulizer solution    Sig: Use via neb q 4 hours prn wheezing    Dispense:  50 vial    Refill:  2    Order Specific Question:   Supervising Provider    Answer:   Mikey Kirschner [[id number]]  . budesonide-formoterol (SYMBICORT) 80-4.5 MCG/ACT inhaler    Sig: Inhale 2 puffs into the lungs 2 (two) times daily.    Dispense:  1 Inhaler    Refill:  11    Order Specific Question:   Supervising Provider    Answer:   Mikey Kirschner [[id number]]    OTC meds as directed for congestion and cough. Given prescription for a nebulizer machine at home to see if this will help more with her wheezing. May alternate with her inhaler. Start Symbicort as preventive care. Patient understands to use albuterol as rescue inhaler. Encouraged 5-10 pounds weight loss regular walking program. Continue daily multivitamin with calcium and vitamin D and magnesium. No changes in her regular medicines today. Callback in 7-10 days if wheezing and cough has not improved, sooner if worse. Return in about 6 months (around 11/23/2016) for  recheck. Repeat lab work at that time. Smoking cessation discussed at length. 40 minutes was spent with this patient.

## 2016-07-09 NOTE — Telephone Encounter (Signed)
Called patient to schedule for [facility], patient refused to schedule at this time. Pt requested that we wait until June 2018 to move forward. Pt aware also that she will have to have her PCP send another referral to the program as her current referral will be expired. I will forward this message to her PCP as FYI that the patient did not want to move forward at this time with the Lung Cancer Screening. Nothing further needed.

## 2016-07-09 NOTE — Telephone Encounter (Signed)
Message is noted.

## 2016-10-15 ENCOUNTER — Encounter: Payer: Self-pay | Admitting: Nurse Practitioner

## 2016-10-15 ENCOUNTER — Ambulatory Visit (HOSPITAL_COMMUNITY)
Admission: RE | Admit: 2016-10-15 | Discharge: 2016-10-15 | Disposition: A | Discharge status: Home / Self Care | Payer: BLUE CROSS/BLUE SHIELD | Source: Ambulatory Visit | Attending: Nurse Practitioner | Admitting: Nurse Practitioner

## 2016-10-15 ENCOUNTER — Ambulatory Visit (INDEPENDENT_AMBULATORY_CARE_PROVIDER_SITE_OTHER): Payer: BLUE CROSS/BLUE SHIELD | Admitting: Nurse Practitioner

## 2016-10-15 VITALS — BP 132/88 | Ht 67.0 in | Wt 171.0 lb

## 2016-10-15 DIAGNOSIS — Z79899 Other long term (current) drug therapy: Secondary | ICD-10-CM | POA: Diagnosis not present

## 2016-10-15 DIAGNOSIS — I700 Atherosclerosis of aorta: Secondary | ICD-10-CM | POA: Diagnosis not present

## 2016-10-15 DIAGNOSIS — E785 Hyperlipidemia, unspecified: Secondary | ICD-10-CM

## 2016-10-15 DIAGNOSIS — M545 Low back pain, unspecified: Secondary | ICD-10-CM

## 2016-10-15 DIAGNOSIS — M898X8 Other specified disorders of bone, other site: Secondary | ICD-10-CM | POA: Diagnosis not present

## 2016-10-15 DIAGNOSIS — M533 Sacrococcygeal disorders, not elsewhere classified: Secondary | ICD-10-CM

## 2016-10-15 MED ORDER — NAPROXEN 500 MG PO TABS: 500.0000 mg | ORAL_TABLET | Freq: Two times a day (BID) | ORAL | 0 refills | Status: DC

## 2016-10-15 NOTE — Progress Notes (Signed)
Subjective:  56 yo female presents today for pain in her tailbone.  Pain began approximately one month ago and not associated with any known injury.  Pain is sharp, can radiate at times to right buttock, throbbing pain with increased activity, and worse with pressure/sitting. Denies radiation to legs, weakness, numbness or tingling, or changes to bowel or bladder function.  Has taken Tylenol for pain at night with minimal relief.  Hx of fall with injury to tailbone 6 yrs ago with full recovery.    Objective:   BP 132/88   Ht 5\' 7"  (1.702 m)   Wt 171 lb (77.6 kg)   BMI 26.78 kg/m  Alert and oriented, NAD. Chest: Lungs CTA, Cardiac: RRR, no murmurs  Skin: old healed hemorrhoid, no lesions or open sores Musc: positive straight leg test in R. Leg with mild pain, moderate tenderness to palpation over distal coccyx region, no obvious masses palpable, 5/5 strength BLE.    Assessment:  Acute midline low back pain without sciatica - Plan: DG Lumbar Spine Complete, DG Sacrum/Coccyx  Hyperlipidemia, unspecified hyperlipidemia type - Plan: Lipid panel  High risk medication use - Plan: Hepatic function panel  Coccyx pain - Plan: DG Sacrum/Coccyx   Plan:   Meds ordered this encounter  Medications  . naproxen (NAPROSYN) 500 MG tablet    Sig: Take 1 tablet (500 mg total) by mouth 2 (two) times daily with a meal.    Dispense:  30 tablet    Refill:  0    Order Specific Question:   Supervising Provider    Answer:   Mikey Kirschner [[id number]]    Start Naproxen BID for pain.  Stop medication if have any GI upset or signs of bleeding.  Do not take with another NSAID.   Will get Xray to evaluate low back/coccyx back pain.  Symptom relief and warning signs reviewed with patient.  Notify office or go to ED for sudden increase in pain severity, changes in bowel or bladder function, weakness, or changes in sensation.    Labs pending.  Will get done prior to next physical appointment.    Return if symptoms  worsen or fail to improve.

## 2016-11-10 ENCOUNTER — Other Ambulatory Visit (INDEPENDENT_AMBULATORY_CARE_PROVIDER_SITE_OTHER): Payer: Self-pay | Admitting: Internal Medicine

## 2016-11-24 ENCOUNTER — Ambulatory Visit (INDEPENDENT_AMBULATORY_CARE_PROVIDER_SITE_OTHER): Payer: BLUE CROSS/BLUE SHIELD | Admitting: Nurse Practitioner

## 2016-11-24 ENCOUNTER — Encounter: Payer: Self-pay | Admitting: Nurse Practitioner

## 2016-11-24 VITALS — BP 130/80 | Ht 67.0 in | Wt 168.5 lb

## 2016-11-24 DIAGNOSIS — E785 Hyperlipidemia, unspecified: Secondary | ICD-10-CM

## 2016-11-24 DIAGNOSIS — K219 Gastro-esophageal reflux disease without esophagitis: Secondary | ICD-10-CM | POA: Diagnosis not present

## 2016-11-24 DIAGNOSIS — R7303 Prediabetes: Secondary | ICD-10-CM | POA: Diagnosis not present

## 2016-11-24 MED ORDER — SIMVASTATIN 40 MG PO TABS: 40.0000 mg | ORAL_TABLET | Freq: Every day | ORAL | 1 refills | Status: DC

## 2016-11-24 NOTE — Progress Notes (Signed)
Subjective:  Presents for recheck and to discuss recent labs. Breathing much improved on Symbicort. No further wheezing. Not having to use Albuterol inhaler or nebs. No CP/ischemic type pain or SOB. No edema. Reflux stable based on her diet; takes Protonix on prn basis. Taking daily MVI with vitamin D.   Objective:   BP 130/80   Ht 5\' 7"  (1.702 m)   Wt 168 lb 8 oz (76.4 kg)   BMI 26.39 kg/m  NAD. Alert, oriented. Lungs breath sounds diminished in general; no wheezing or tachypnea. Normal color. Faint expiratory sonorous rhonchi RUL which resolved on second assessment. Heart RRR. LE: no edema.  See scanned lab report dated 11/21/16. Reviewed results with patient.   Assessment:   Problem List Items Addressed This Visit      Digestive   GERD (gastroesophageal reflux disease)     Other   Hyperlipemia - Primary (Chronic)   Relevant Medications   simvastatin (ZOCOR) 40 MG tablet   Prediabetes       Plan:   Meds ordered this encounter  Medications  . simvastatin (ZOCOR) 40 MG tablet    Sig: Take 1 tablet (40 mg total) by mouth daily. for cholesterol    Dispense:  90 tablet    Refill:  1    Order Specific Question:   Supervising Provider    Answer:   Mikey Kirschner [[id number]]   Continue current medications. Discussed importance of smoking cessation. Based on demineralization noted on plain xray of her low back and history of smoking, recommend chest xray and bone density. Patient has a family history of osteoporosis. Defers for now since she owes over $600 to hospital for latest xrays due to her deductible.  Return in about 6 months (around 05/27/2017) for recheck. Call back sooner if any problems. Continue vitamin D and weight bearing exercises.

## 2017-02-11 ENCOUNTER — Telehealth (INDEPENDENT_AMBULATORY_CARE_PROVIDER_SITE_OTHER): Payer: Self-pay | Admitting: *Deleted

## 2017-02-11 NOTE — Telephone Encounter (Signed)
A PA was completed for the patient for Pantoprazole 40 mg twice a day. Her insurance denied this request. Per Lelon Perla the patient is to take 1 bt mouth in the morning, 30 minutes before breakfast. In the evening she may take a Pepcid OTC or Zantac  for any breakthrough symptoms she may have. This message was left on her voicemail.

## 2017-04-06 ENCOUNTER — Telehealth: Payer: Self-pay | Admitting: *Deleted

## 2017-04-06 DIAGNOSIS — R928 Other abnormal and inconclusive findings on diagnostic imaging of breast: Secondary | ICD-10-CM

## 2017-04-06 NOTE — Telephone Encounter (Signed)
Pt aware order in for mammogram and [place]

## 2017-04-21 ENCOUNTER — Other Ambulatory Visit: Payer: Self-pay | Admitting: Adult Health

## 2017-04-21 ENCOUNTER — Ambulatory Visit (HOSPITAL_COMMUNITY)
Admission: RE | Admit: 2017-04-21 | Discharge: 2017-04-21 | Disposition: A | Discharge status: Home / Self Care | Payer: Commercial Managed Care - PPO | Source: Ambulatory Visit | Attending: Adult Health | Admitting: Adult Health

## 2017-04-21 DIAGNOSIS — R928 Other abnormal and inconclusive findings on diagnostic imaging of breast: Secondary | ICD-10-CM | POA: Diagnosis present

## 2017-04-21 DIAGNOSIS — N6320 Unspecified lump in the left breast, unspecified quadrant: Secondary | ICD-10-CM

## 2017-04-28 ENCOUNTER — Ambulatory Visit (HOSPITAL_COMMUNITY)
Admission: RE | Admit: 2017-04-28 | Discharge: 2017-04-28 | Disposition: A | Discharge status: Home / Self Care | Payer: Commercial Managed Care - PPO | Source: Ambulatory Visit | Attending: Adult Health | Admitting: Adult Health

## 2017-04-28 DIAGNOSIS — N6002 Solitary cyst of left breast: Secondary | ICD-10-CM | POA: Insufficient documentation

## 2017-04-28 DIAGNOSIS — N6321 Unspecified lump in the left breast, upper outer quadrant: Secondary | ICD-10-CM | POA: Diagnosis present

## 2017-04-28 DIAGNOSIS — N6320 Unspecified lump in the left breast, unspecified quadrant: Secondary | ICD-10-CM

## 2017-04-28 MED ORDER — LIDOCAINE HCL (PF) 2 % IJ SOLN
INTRAMUSCULAR | Status: AC
  Administered 2017-04-28: 8 mL
  Filled 2017-04-28: qty 10

## 2017-04-28 MED ORDER — LIDOCAINE-EPINEPHRINE (PF) 1 %-1:200000 IJ SOLN
INTRAMUSCULAR | Status: AC
  Administered 2017-04-28: 10 mL
  Filled 2017-04-28: qty 30

## 2017-04-28 MED ORDER — SODIUM BICARBONATE 4 % IV SOLN
INTRAVENOUS | Status: AC
  Administered 2017-04-28: 4 mL
  Filled 2017-04-28: qty 5

## 2017-05-08 ENCOUNTER — Telehealth: Payer: Self-pay | Admitting: Nurse Practitioner

## 2017-05-08 DIAGNOSIS — R7303 Prediabetes: Secondary | ICD-10-CM

## 2017-05-08 DIAGNOSIS — Z79899 Other long term (current) drug therapy: Secondary | ICD-10-CM

## 2017-05-08 DIAGNOSIS — R5383 Other fatigue: Secondary | ICD-10-CM

## 2017-05-08 DIAGNOSIS — E559 Vitamin D deficiency, unspecified: Secondary | ICD-10-CM

## 2017-05-08 DIAGNOSIS — E785 Hyperlipidemia, unspecified: Secondary | ICD-10-CM

## 2017-05-08 NOTE — Telephone Encounter (Signed)
Pt has appt for 05/27/17 with [name] - needs lab orders mailed to her so that she can have it done at work for no charge States we've done this before  Please call when done

## 2017-05-10 NOTE — Telephone Encounter (Signed)
Please give a written Rx for: met 7, lipid, liver, TSH, HgbA1c and Vitamin D. Thanks.

## 2017-05-11 NOTE — Telephone Encounter (Signed)
Pt notified order put in mail today.

## 2017-05-11 NOTE — Telephone Encounter (Signed)
Order for bloodwork put in and wrote on a script to mail to pt. Left message on voicemail to notify pt.

## 2017-05-27 ENCOUNTER — Ambulatory Visit: Payer: BLUE CROSS/BLUE SHIELD | Admitting: Nurse Practitioner

## 2017-05-27 ENCOUNTER — Other Ambulatory Visit: Payer: BLUE CROSS/BLUE SHIELD | Admitting: Adult Health

## 2017-06-08 ENCOUNTER — Encounter: Payer: Self-pay | Admitting: Nurse Practitioner

## 2017-06-08 ENCOUNTER — Telehealth: Payer: Self-pay | Admitting: Family Medicine

## 2017-06-08 NOTE — Telephone Encounter (Signed)
Review lab results from [facility] in results folder.

## 2017-06-08 NOTE — Telephone Encounter (Signed)
Done. Sent my chart message regarding results.

## 2017-06-12 ENCOUNTER — Ambulatory Visit (INDEPENDENT_AMBULATORY_CARE_PROVIDER_SITE_OTHER): Payer: Commercial Managed Care - PPO | Admitting: Nurse Practitioner

## 2017-06-12 ENCOUNTER — Encounter: Payer: Self-pay | Admitting: Nurse Practitioner

## 2017-06-12 VITALS — BP 128/82 | Ht 67.0 in | Wt 162.0 lb

## 2017-06-12 DIAGNOSIS — R7303 Prediabetes: Secondary | ICD-10-CM

## 2017-06-12 DIAGNOSIS — E785 Hyperlipidemia, unspecified: Secondary | ICD-10-CM | POA: Diagnosis not present

## 2017-06-12 DIAGNOSIS — E559 Vitamin D deficiency, unspecified: Secondary | ICD-10-CM

## 2017-06-12 MED ORDER — VITAMIN D (ERGOCALCIFEROL) 1.25 MG (50000 UNIT) PO CAPS: 50000.0000 [IU] | ORAL_CAPSULE | ORAL | 2 refills | Status: DC

## 2017-06-12 MED ORDER — SIMVASTATIN 40 MG PO TABS: 40.0000 mg | ORAL_TABLET | Freq: Every day | ORAL | 1 refills | Status: DC

## 2017-06-12 NOTE — Progress Notes (Signed)
Subjective: Presents for recheck hyperlipidemia and prediabetes.  No chest pain/ischemic type pain or shortness of breath.  Compliant with medications.  Has a physical scheduled with gynecology next week.  Has lab work with her today done through her employer on 06/01/2017.  Continues to work on activity and weight loss.  Overall very healthy diet.  Sees gastroenterology for reflux and bowel issues.  Objective:   BP 128/82   Ht 5\' 7"  (1.702 m)   Wt 162 lb (73.5 kg)   BMI 25.37 kg/m  NAD.  Alert, oriented.  Lungs clear.  Heart regular rate and rhythm.  Carotids no bruits or thrills.  Lower extremities no edema. See scanned lab report.  Fasting glucose 106.  LDL cholesterol 111, HDL 60.  Hemoglobin A1c 6.3.  Vitamin D low at 15.9.  Assessment:   Problem List Items Addressed This Visit      Other   Hyperlipemia - Primary (Chronic)   Relevant Medications   simvastatin (ZOCOR) 40 MG tablet   Prediabetes   Vitamin D deficiency       Plan:   Meds ordered this encounter  Medications  . DISCONTD: Vitamin D, Ergocalciferol, (DRISDOL) 50000 units CAPS capsule    Sig: Take 1 capsule (50,000 Units total) by mouth every 7 (seven) days.    Dispense:  4 capsule    Refill:  2    Order Specific Question:   Supervising Provider    Answer:   Mikey Kirschner [[id number]]  . simvastatin (ZOCOR) 40 MG tablet    Sig: Take 1 tablet (40 mg total) by mouth daily. for cholesterol    Dispense:  90 tablet    Refill:  1    Order Specific Question:   Supervising Provider    Answer:   Mikey Kirschner [[id number]]  . Vitamin D, Ergocalciferol, (DRISDOL) 50000 units CAPS capsule    Sig: Take 1 capsule (50,000 Units total) by mouth every 7 (seven) days.    Dispense:  4 capsule    Refill:  2    Order Specific Question:   Supervising Provider    Answer:   Mikey Kirschner [[id number]]   Continue current medications as directed.  Start prescription vitamin D as directed, once complete start OTC as directed.  Encouraged  continued healthy diet regular activity. Return in about 6 months (around 12/10/2017) for routine follow up.

## 2017-07-03 ENCOUNTER — Other Ambulatory Visit: Payer: BLUE CROSS/BLUE SHIELD | Admitting: Adult Health

## 2017-07-09 ENCOUNTER — Telehealth: Payer: Self-pay | Admitting: Nurse Practitioner

## 2017-07-09 NOTE — Telephone Encounter (Signed)
Left message to return call 

## 2017-07-09 NOTE — Telephone Encounter (Signed)
Pt states she is taking prescription vit d once a week that was prescribed by you at her last visit

## 2017-07-09 NOTE — Telephone Encounter (Signed)
Patient had an unread my chart message please see below:   Monica Russell, I noted some things on your labs especially low vitamin D. Does the county address this or do you want me to? Not sure what we did last time. [name]

## 2017-08-05 ENCOUNTER — Telehealth: Payer: Self-pay | Admitting: Family Medicine

## 2017-08-05 NOTE — Telephone Encounter (Signed)
I doubt she would get this problem with the lower standard supplemental dose 1000 IU vitamin D daily.  If she is still having problems with this dose she should follow-up or let us know she should keep any regular follow-up visit as well she may stop the higher dose vitamin D.  Please make all changes in epic medicine list.  Patient to follow-up sooner if any issues or problems

## 2017-08-05 NOTE — Telephone Encounter (Signed)
Patient prescribed the once a week Vit D 06/12/17

## 2017-08-05 NOTE — Telephone Encounter (Signed)
Patient was prescribed Vitamin D by [name] at her last visit.  She said she believes it is causing her face to swell and develop a fine rash right after she takes it.  She said she did some research and found that this could be a side effect.  She wants to know if she should discontinue taking this medication?

## 2017-08-05 NOTE — Telephone Encounter (Signed)
Please advise 

## 2017-08-06 ENCOUNTER — Other Ambulatory Visit: Payer: Self-pay | Admitting: *Deleted

## 2017-08-06 ENCOUNTER — Encounter: Payer: Self-pay | Admitting: *Deleted

## 2017-08-06 NOTE — Telephone Encounter (Signed)
Discussed with pt. Pt verbalized understanding. Med list updated.  

## 2017-08-11 ENCOUNTER — Ambulatory Visit (INDEPENDENT_AMBULATORY_CARE_PROVIDER_SITE_OTHER): Payer: Commercial Managed Care - PPO | Admitting: Adult Health

## 2017-08-11 ENCOUNTER — Other Ambulatory Visit (HOSPITAL_COMMUNITY)
Admission: RE | Admit: 2017-08-11 | Discharge: 2017-08-11 | Disposition: A | Discharge status: Home / Self Care | Payer: Commercial Managed Care - PPO | Source: Ambulatory Visit | Attending: Adult Health | Admitting: Adult Health

## 2017-08-11 ENCOUNTER — Encounter: Payer: Self-pay | Admitting: Adult Health

## 2017-08-11 VITALS — BP 100/80 | HR 94 | Ht 67.0 in | Wt 162.0 lb

## 2017-08-11 DIAGNOSIS — R198 Other specified symptoms and signs involving the digestive system and abdomen: Secondary | ICD-10-CM | POA: Diagnosis not present

## 2017-08-11 DIAGNOSIS — Z01411 Encounter for gynecological examination (general) (routine) with abnormal findings: Secondary | ICD-10-CM | POA: Diagnosis not present

## 2017-08-11 DIAGNOSIS — Z1212 Encounter for screening for malignant neoplasm of rectum: Secondary | ICD-10-CM

## 2017-08-11 DIAGNOSIS — Z1211 Encounter for screening for malignant neoplasm of colon: Secondary | ICD-10-CM

## 2017-08-11 DIAGNOSIS — Z01419 Encounter for gynecological examination (general) (routine) without abnormal findings: Secondary | ICD-10-CM | POA: Insufficient documentation

## 2017-08-11 LAB — HEMOCCULT GUIAC POC 1CARD (OFFICE): FECAL OCCULT BLD: NEGATIVE

## 2017-08-11 NOTE — Progress Notes (Signed)
Patient ID: Monica Russell, female   DOB: Jun 10, 1961, 57 y.o.   MRN: [id number] History of Present Illness: Monica Russell is a 57 year old white female, married, G1P1, PM in for well woman gyn exam and pap. PCP is C. Sumner Boast, NP.   Current Medications, Allergies, Past Medical History, Past Surgical History, Family History and Social History were reviewed in [employer] record.     Review of Systems: Patient denies any headaches, hearing loss, fatigue, blurred vision, shortness of breath, chest pain, abdominal pain, problems with bowel movements, urination, or intercourse. No joint pain or mood swings. +rectal pressure and pain for over 9 months, had negative xray. Has trouble falling a sleep.    Physical Exam: General:  Well developed, well nourished, no acute distress Skin:  Warm and dry Neck:  Midline trachea, normal thyroid, good ROM, no lymphadenopathy Lungs; Clear to auscultation bilaterally Breast:  No dominant palpable mass, retraction, or nipple discharge Cardiovascular: Regular rate and rhythm Abdomen:  Soft, non tender, no hepatosplenomegaly Pelvic:  External genitalia is normal in appearance, no lesions.  The vagina is normal in appearance for age with loss of color and rugae and moisture. Urethra has no lesions or masses. The cervix is smooth, pap with HPV performed.  Uterus is felt to be normal size, shape, and contour.  No adnexal masses or tenderness noted.Bladder is non tender, no masses felt. Rectal: Good sphincter tone, no polyps, + hemorrhoids felt.  Hemoccult negative. Extremities/musculoskeletal:  No swelling or varicosities noted, no clubbing or cyanosis Psych:  No mood changes, alert and cooperative,seems happy PHQ 9 score 1.  Impression: 1. Encounter for gynecological examination with Papanicolaou smear of cervix   2. Screening for colorectal cancer   3. Rectal pressure       Plan: Physical in 1 year Pap in 3 if normal Mammogram  yearly Labs with PCP Will get CT abd/pelvis scheduled to assess rectal pressure at [facility] 2/6 at 8 am Try melatonin 10 mg at hs

## 2017-08-12 LAB — CYTOLOGY - PAP
DIAGNOSIS: NEGATIVE
HPV (WINDOPATH): NOT DETECTED

## 2017-08-19 ENCOUNTER — Ambulatory Visit (HOSPITAL_COMMUNITY): Payer: Commercial Managed Care - PPO

## 2017-08-27 ENCOUNTER — Ambulatory Visit (HOSPITAL_COMMUNITY): Payer: Commercial Managed Care - PPO

## 2017-09-08 ENCOUNTER — Ambulatory Visit: Admission: RE | Admit: 2017-09-08 | Payer: Commercial Managed Care - PPO | Source: Ambulatory Visit

## 2017-09-15 ENCOUNTER — Telehealth: Payer: Self-pay | Admitting: Adult Health

## 2017-09-15 NOTE — Telephone Encounter (Signed)
Pt has not had CT had to reschedule and it was scheduled at [facility] not [facility], so she cancelled she is going to reschedule.

## 2017-09-22 ENCOUNTER — Other Ambulatory Visit (INDEPENDENT_AMBULATORY_CARE_PROVIDER_SITE_OTHER): Payer: Self-pay | Admitting: Internal Medicine

## 2017-10-26 ENCOUNTER — Ambulatory Visit (HOSPITAL_COMMUNITY)
Admission: RE | Admit: 2017-10-26 | Discharge: 2017-10-26 | Disposition: A | Discharge status: Home / Self Care | Payer: Commercial Managed Care - PPO | Source: Ambulatory Visit | Attending: Adult Health | Admitting: Adult Health

## 2017-10-26 DIAGNOSIS — I700 Atherosclerosis of aorta: Secondary | ICD-10-CM | POA: Insufficient documentation

## 2017-10-26 DIAGNOSIS — R198 Other specified symptoms and signs involving the digestive system and abdomen: Secondary | ICD-10-CM | POA: Insufficient documentation

## 2017-10-26 DIAGNOSIS — E279 Disorder of adrenal gland, unspecified: Secondary | ICD-10-CM | POA: Diagnosis not present

## 2017-10-26 DIAGNOSIS — Z905 Acquired absence of kidney: Secondary | ICD-10-CM | POA: Insufficient documentation

## 2017-10-26 LAB — POCT I-STAT CREATININE: Creatinine, Ser: 0.6 mg/dL (ref 0.44–1.00)

## 2017-10-26 MED ORDER — IOPAMIDOL (ISOVUE-300) INJECTION 61%
100.0000 mL | Freq: Once | INTRAVENOUS | Status: AC | PRN
  Administered 2017-10-26: 100 mL via INTRAVENOUS

## 2017-11-03 ENCOUNTER — Telehealth: Payer: Self-pay | Admitting: Adult Health

## 2017-11-03 DIAGNOSIS — R198 Other specified symptoms and signs involving the digestive system and abdomen: Secondary | ICD-10-CM

## 2017-11-03 NOTE — Telephone Encounter (Signed)
Pt aware of CT results, refer to Roseanne Kaufman at [facility] and talk with PCP too.

## 2017-11-05 ENCOUNTER — Telehealth: Payer: Self-pay | Admitting: Nurse Practitioner

## 2017-11-05 DIAGNOSIS — R7303 Prediabetes: Secondary | ICD-10-CM

## 2017-11-05 DIAGNOSIS — E559 Vitamin D deficiency, unspecified: Secondary | ICD-10-CM

## 2017-11-05 DIAGNOSIS — Z79899 Other long term (current) drug therapy: Secondary | ICD-10-CM

## 2017-11-05 DIAGNOSIS — R5383 Other fatigue: Secondary | ICD-10-CM

## 2017-11-05 DIAGNOSIS — E785 Hyperlipidemia, unspecified: Secondary | ICD-10-CM

## 2017-11-05 NOTE — Telephone Encounter (Signed)
Patient is aware and the orders faxed to the Pa as requested.

## 2017-11-05 NOTE — Telephone Encounter (Signed)
Yes. Same labs please. Thanks.

## 2017-11-05 NOTE — Telephone Encounter (Signed)
I spoke with the pt she states Monica Russell normally sends labs orders to her work for her. Her last labs were 05/11/2017 Vitd ,a1c,tsh,bmet,hepatic fx panel,lipid panel. Same? Please advise.

## 2017-11-05 NOTE — Telephone Encounter (Signed)
Patient is requesting orders for labs to be sent to her PA at work to have done.  f - [phone] ATTN: Sharren Bridge

## 2017-11-17 ENCOUNTER — Other Ambulatory Visit: Payer: Self-pay | Admitting: *Deleted

## 2017-11-17 ENCOUNTER — Telehealth: Payer: Self-pay | Admitting: Nurse Practitioner

## 2017-11-17 DIAGNOSIS — R198 Other specified symptoms and signs involving the digestive system and abdomen: Secondary | ICD-10-CM

## 2017-11-17 NOTE — Telephone Encounter (Signed)
Review results to labwork from [facility] in yellow folder on desk.

## 2017-11-20 ENCOUNTER — Encounter: Payer: Self-pay | Admitting: Nurse Practitioner

## 2017-12-03 ENCOUNTER — Ambulatory Visit (INDEPENDENT_AMBULATORY_CARE_PROVIDER_SITE_OTHER): Payer: Commercial Managed Care - PPO | Admitting: Internal Medicine

## 2017-12-10 ENCOUNTER — Encounter: Payer: Self-pay | Admitting: Nurse Practitioner

## 2017-12-10 ENCOUNTER — Ambulatory Visit (INDEPENDENT_AMBULATORY_CARE_PROVIDER_SITE_OTHER): Payer: Commercial Managed Care - PPO | Admitting: Nurse Practitioner

## 2017-12-10 VITALS — BP 118/80 | Ht 67.0 in | Wt 159.0 lb

## 2017-12-10 DIAGNOSIS — R7303 Prediabetes: Secondary | ICD-10-CM

## 2017-12-10 DIAGNOSIS — E785 Hyperlipidemia, unspecified: Secondary | ICD-10-CM | POA: Diagnosis not present

## 2017-12-10 DIAGNOSIS — E875 Hyperkalemia: Secondary | ICD-10-CM | POA: Diagnosis not present

## 2017-12-10 DIAGNOSIS — K219 Gastro-esophageal reflux disease without esophagitis: Secondary | ICD-10-CM

## 2017-12-10 MED ORDER — SIMVASTATIN 40 MG PO TABS: 40.0000 mg | ORAL_TABLET | Freq: Every day | ORAL | 1 refills | Status: DC

## 2017-12-10 MED ORDER — METFORMIN HCL ER 500 MG PO TB24: 500.0000 mg | ORAL_TABLET | Freq: Every day | ORAL | 2 refills | Status: DC

## 2017-12-11 ENCOUNTER — Encounter: Payer: Self-pay | Admitting: Nurse Practitioner

## 2017-12-11 NOTE — Progress Notes (Addendum)
Subjective:  Presents for recheck on chronic health issues and to review her recent labs done at work. Adherent to medication regimen. Active lifestyle. Has lost weight. Takes rare Protonix. Rarely has to use her Albuterol for her breathing. Continues to smoke. Denies CP/ischemic type pain or SOB. No visual changes. No difficulty speaking or swallowing. No numbness or weakness of the face, arms or legs. Gets regular gynecological exams.   Objective:   BP 118/80   Ht 5\' 7"  (1.702 m)   Wt 159 lb (72.1 kg)   BMI 24.90 kg/m  NAD. Alert, oriented. Lungs clear. Heart RRR. Abdomen soft, non distended, non tender.  See scanned lab report dated 11/11/17. FBS 112. Potassium 5.4; A1c 6.3.  Assessment:   Problem List Items Addressed This Visit      Digestive   GERD (gastroesophageal reflux disease)     Other   Hyperlipemia - Primary (Chronic)   Relevant Medications   simvastatin (ZOCOR) 40 MG tablet   Prediabetes    Other Visit Diagnoses    Hyperkalemia           Plan:   Meds ordered this encounter  Medications  . simvastatin (ZOCOR) 40 MG tablet    Sig: Take 1 tablet (40 mg total) by mouth daily. for cholesterol    Dispense:  90 tablet    Refill:  1    Order Specific Question:   Supervising Provider    Answer:   Mikey Kirschner [[id number]]  . metFORMIN (GLUCOPHAGE-XR) 500 MG 24 hr tablet    Sig: Take 1 tablet (500 mg total) by mouth daily with breakfast.    Dispense:  30 tablet    Refill:  2    Order Specific Question:   Supervising Provider    Answer:   Mikey Kirschner [[id number]]   Add Metformin to regimen. Continue healthy lifestyle habits. Given note for work to repeat potassium level and fax results to our office.  Return in about 6 months (around 06/12/2018) for recheck.

## 2017-12-14 ENCOUNTER — Other Ambulatory Visit: Payer: Self-pay | Admitting: Nurse Practitioner

## 2017-12-22 ENCOUNTER — Telehealth: Payer: Self-pay | Admitting: Nurse Practitioner

## 2017-12-22 ENCOUNTER — Telehealth: Payer: Self-pay | Admitting: Family Medicine

## 2017-12-22 ENCOUNTER — Other Ambulatory Visit: Payer: Self-pay | Admitting: Nurse Practitioner

## 2017-12-22 MED ORDER — SIMVASTATIN 40 MG PO TABS: 40.0000 mg | ORAL_TABLET | Freq: Every day | ORAL | 1 refills | Status: DC

## 2017-12-22 NOTE — Telephone Encounter (Signed)
Done

## 2017-12-22 NOTE — Telephone Encounter (Signed)
Refill request for Simvastatin 40mg . Take one tablet by mouth daily. 90 day supply with one refill; last filled 10/07/2017

## 2018-03-26 ENCOUNTER — Other Ambulatory Visit: Payer: Self-pay | Admitting: *Deleted

## 2018-03-26 MED ORDER — METFORMIN HCL ER 500 MG PO TB24: 500.0000 mg | ORAL_TABLET | Freq: Every day | ORAL | 1 refills | Status: DC

## 2018-05-10 ENCOUNTER — Telehealth: Payer: Self-pay | Admitting: Family Medicine

## 2018-05-10 DIAGNOSIS — E119 Type 2 diabetes mellitus without complications: Secondary | ICD-10-CM

## 2018-05-10 DIAGNOSIS — E785 Hyperlipidemia, unspecified: Secondary | ICD-10-CM

## 2018-05-10 DIAGNOSIS — Z79899 Other long term (current) drug therapy: Secondary | ICD-10-CM

## 2018-05-10 NOTE — Telephone Encounter (Signed)
Pt has 6 month follow up on 06/02/18 with Dr. Nicki Reaper. She would like to have her lab orders mailed to her so she can get those done before her appt.

## 2018-05-10 NOTE — Telephone Encounter (Signed)
Last labs done on 11/13/17. Lipid, liver, a1c, bmp, a1c, tsh, vit d. Results scanned in epic in July.

## 2018-05-10 NOTE — Telephone Encounter (Signed)
Lipid, liver, metabolic 7, E7O, urine ACR Also CBC Also it is standard recommendation to do HIV antibody and hepatitis C antibody as screening tests one-time test for all adults

## 2018-05-11 NOTE — Telephone Encounter (Signed)
Pt called back and does not want hiv or hep c at this time.

## 2018-05-11 NOTE — Telephone Encounter (Signed)
Other orders put in and mailed to pt to do through the county. Pt notified.

## 2018-05-11 NOTE — Telephone Encounter (Signed)
Left message to return call to see if pt wants to do hiv and hep c

## 2018-05-28 ENCOUNTER — Encounter: Payer: Self-pay | Admitting: Family Medicine

## 2018-06-02 ENCOUNTER — Ambulatory Visit (INDEPENDENT_AMBULATORY_CARE_PROVIDER_SITE_OTHER): Payer: Commercial Managed Care - PPO | Admitting: Family Medicine

## 2018-06-02 ENCOUNTER — Encounter: Payer: Self-pay | Admitting: Family Medicine

## 2018-06-02 VITALS — BP 122/74 | Ht 67.0 in | Wt 152.4 lb

## 2018-06-02 DIAGNOSIS — R7303 Prediabetes: Secondary | ICD-10-CM

## 2018-06-02 DIAGNOSIS — Z23 Encounter for immunization: Secondary | ICD-10-CM

## 2018-06-02 DIAGNOSIS — E785 Hyperlipidemia, unspecified: Secondary | ICD-10-CM

## 2018-06-02 MED ORDER — BUDESONIDE-FORMOTEROL FUMARATE 80-4.5 MCG/ACT IN AERO: 2.0000 | INHALATION_SPRAY | Freq: Two times a day (BID) | RESPIRATORY_TRACT | 11 refills | Status: DC

## 2018-06-02 MED ORDER — SIMVASTATIN 40 MG PO TABS: 40.0000 mg | ORAL_TABLET | Freq: Every day | ORAL | 1 refills | Status: DC

## 2018-06-02 NOTE — Progress Notes (Signed)
   Subjective:    Patient ID: Monica Russell, female    DOB: 1960/10/09, 57 y.o.   MRN: [id number]  Hyperlipidemia  This is a chronic problem. Pertinent negatives include no chest pain or shortness of breath. There are no compliance problems.   pt here today for 6 month follow up.   Pt has prediabetes and GERD also.  Labs were brought in today these will be scanned into the system She does try to watch her diet Denies any chest tightness pressure pain Relates compliance with her medicines Tolerates metformin well Has lost weight with exercise and watching diet  No compliance problems.   Pt has changed diet and that has helped GERD.     Review of Systems  Constitutional: Negative for activity change and appetite change.  HENT: Negative for congestion and rhinorrhea.   Respiratory: Negative for cough and shortness of breath.   Cardiovascular: Negative for chest pain and leg swelling.  Gastrointestinal: Negative for abdominal pain, nausea and vomiting.  Skin: Negative for color change.  Neurological: Negative for dizziness and weakness.  Psychiatric/Behavioral: Negative for agitation and confusion.       Objective:   Physical Exam  Constitutional: She appears well-nourished. No distress.  HENT:  Head: Normocephalic and atraumatic.  Eyes: Right eye exhibits no discharge. Left eye exhibits no discharge.  Neck: No tracheal deviation present.  Cardiovascular: Normal rate, regular rhythm and normal heart sounds.  No murmur heard. Pulmonary/Chest: Effort normal and breath sounds normal. No respiratory distress.  Musculoskeletal: She exhibits no edema.  Lymphadenopathy:    She has no cervical adenopathy.  Neurological: She is alert. Coordination normal.  Skin: Skin is warm and dry.  Psychiatric: She has a normal mood and affect. Her behavior is normal.  Vitals reviewed.         Assessment & Plan:  Smoker Very important for the patient to quit Patient Counseled to do  so  The patient was seen today as part of an evaluation regarding hyperlipidemia.  Recent lab work has been reviewed with the patient as well as the goals for good cholesterol care.  In addition to this medications have been discussed the importance of compliance with diet and medications discussed as well.  Finally the patient is aware that poor control of cholesterol, noncompliance can dramatically increase the risk of complications. The patient will keep regular office visits and the patient does agreed to periodic lab work.  Prediabetes under very good control may go ahead and stop the metformin repeat lab work again in 6 months time  Reflux under good control with dietary measures no need to make any changes currently  15 minutes was spent with patient today discussing healthcare issues which they came.  More than 50% of this visit-total duration of visit-was spent in counseling and coordination of care.  Please see diagnosis regarding the focus of this coordination and care  Patient gets female health checkup and preventative care elsewhere

## 2018-06-09 ENCOUNTER — Ambulatory Visit: Payer: Commercial Managed Care - PPO | Admitting: Family Medicine

## 2018-06-30 NOTE — Telephone Encounter (Signed)
error 

## 2018-08-08 ENCOUNTER — Telehealth: Payer: Self-pay | Admitting: Family Medicine

## 2018-08-08 DIAGNOSIS — R809 Proteinuria, unspecified: Secondary | ICD-10-CM

## 2018-08-08 NOTE — Telephone Encounter (Signed)
Nurses Patient had lab work sent from her nurse practitioner at the [facility] This showed elevated urine ACR Ratio is 169.7 Normal is 0-30 Given this finding this patient does need further evaluation I recommend the following Metabolic 7, urinalysis with microscopic, urine 24-hour protein, urine 24-hour creatinine clearance-all of these test can be done through [facility] (If patient chooses to get these through her nurse practitioner she will need to get the printed results and bring them with her when she comes for her office visit)  Essentially we need to evaluate her kidneys further. I would like for the patient to do these tests then to follow-up approximately a week later when we have the results then we can discuss.  Based upon this may need to get nephrology involved.

## 2018-08-09 NOTE — Addendum Note (Signed)
Addended by: Karle Barr on: 08/09/2018 08:29 AM   Modules accepted: Orders

## 2018-08-09 NOTE — Telephone Encounter (Signed)
Patient is aware of all and the orders have been sent.

## 2018-08-24 LAB — MICROSCOPIC EXAMINATION: Casts: NONE SEEN /lpf

## 2018-08-24 LAB — CREATININE CLEARANCE, URINE, 24 HOUR
CREATININE: 0.62 mg/dL (ref 0.57–1.00)
Creatinine Clearance: 113 mL/min (ref 88–128)
Creatinine, 24H Ur: 1012 mg/24 hr (ref 800–1800)
Creatinine, Urine: 46 mg/dL
GFR calc Af Amer: 114 mL/min/{1.73_m2} (ref >59)
GFR, EST NON AFRICAN AMERICAN: 99 mL/min/{1.73_m2} (ref >59)

## 2018-08-24 LAB — URINALYSIS, ROUTINE W REFLEX MICROSCOPIC
Bilirubin, UA: NEGATIVE
Glucose, UA: NEGATIVE
Ketones, UA: NEGATIVE
Nitrite, UA: NEGATIVE
Protein, UA: NEGATIVE
RBC, UA: NEGATIVE
Specific Gravity, UA: 1.009 (ref 1.005–1.030)
Urobilinogen, Ur: 0.2 mg/dL (ref 0.2–1.0)
pH, UA: 6.5 (ref 5.0–7.5)

## 2018-08-24 LAB — PROTEIN, URINE, 24 HOUR
Protein, 24H Urine: 297 mg/24 hr — ABNORMAL HIGH (ref 30–150)
Protein, Ur: 13.5 mg/dL

## 2018-08-24 LAB — BASIC METABOLIC PANEL
BUN/Creatinine Ratio: 9 (ref 9–23)
BUN: 6 mg/dL (ref 6–24)
CO2: 23 mmol/L (ref 20–29)
Calcium: 9.1 mg/dL (ref 8.7–10.2)
Chloride: 102 mmol/L (ref 96–106)
Creatinine, Ser: 0.66 mg/dL (ref 0.57–1.00)
GFR, EST AFRICAN AMERICAN: 112 mL/min/{1.73_m2} (ref >59)
GFR, EST NON AFRICAN AMERICAN: 97 mL/min/{1.73_m2} (ref >59)
Glucose: 106 mg/dL — ABNORMAL HIGH (ref 65–99)
Potassium: 4.9 mmol/L (ref 3.5–5.2)
SODIUM: 140 mmol/L (ref 134–144)

## 2018-08-25 ENCOUNTER — Other Ambulatory Visit: Payer: Self-pay | Admitting: Family Medicine

## 2018-08-25 DIAGNOSIS — R809 Proteinuria, unspecified: Secondary | ICD-10-CM

## 2018-09-01 ENCOUNTER — Encounter: Payer: Self-pay | Admitting: Family Medicine

## 2018-09-14 ENCOUNTER — Encounter: Payer: Self-pay | Admitting: Family Medicine

## 2018-11-30 ENCOUNTER — Ambulatory Visit: Payer: Commercial Managed Care - PPO | Admitting: Family Medicine

## 2018-12-14 ENCOUNTER — Telehealth: Payer: Self-pay | Admitting: Family Medicine

## 2018-12-14 DIAGNOSIS — R7303 Prediabetes: Secondary | ICD-10-CM

## 2018-12-14 DIAGNOSIS — E785 Hyperlipidemia, unspecified: Secondary | ICD-10-CM

## 2018-12-14 DIAGNOSIS — Z79899 Other long term (current) drug therapy: Secondary | ICD-10-CM

## 2018-12-14 NOTE — Telephone Encounter (Signed)
Lipid, liver, metabolic 7 Very important that the patient does follow-up visit with [place] later in the summer

## 2018-12-14 NOTE — Telephone Encounter (Signed)
Patient said that she is suppose to have labwork every 6 mos for her cholesterol.  She has them done by NP at her work but needs an order.  We had sent her a letter canceling her appt for May but she plans on rescheduling this when COVID calms down.  Would like lab orders mailed to her home address and then will schedule an appt after she has labwork done. I confirmed patient's address is [name] [name].

## 2018-12-15 NOTE — Telephone Encounter (Signed)
Patient is aware the labs have been entered into epic and it is fasting. She also wanted a copy of the order, it was mailed to her on 12/15/2018. She states she will call back to set up the appt.

## 2019-02-14 ENCOUNTER — Other Ambulatory Visit: Payer: Commercial Managed Care - PPO

## 2019-02-14 ENCOUNTER — Other Ambulatory Visit: Payer: Self-pay

## 2019-02-14 DIAGNOSIS — Z20822 Contact with and (suspected) exposure to covid-19: Secondary | ICD-10-CM

## 2019-02-15 LAB — NOVEL CORONAVIRUS, NAA: SARS-CoV-2, NAA: NOT DETECTED

## 2019-04-10 ENCOUNTER — Other Ambulatory Visit: Payer: Self-pay | Admitting: Family Medicine

## 2019-04-11 NOTE — Telephone Encounter (Signed)
Need to schedule follow-up visit virtual is preferred or in person 1 refill

## 2019-05-01 ENCOUNTER — Telehealth: Payer: Self-pay | Admitting: Family Medicine

## 2019-05-01 DIAGNOSIS — E875 Hyperkalemia: Secondary | ICD-10-CM

## 2019-05-01 NOTE — Telephone Encounter (Signed)
Nurses Patient does need to do a follow-up visit this fall either in person or virtual  ( I received her recent lab work which was completed on October 7 glucose slightly elevated at 104 HDL very good at 53 total cholesterol 177 LDL slightly elevated 110 liver enzymes look good.  Potassium elevated 5.6)  Patient's potassium 5.6 she needs to avoid potassium rich foods such as orange juice bananas. I recommend patient repeat a metabolic 7 somewhere within the next 10 days to double check her potassium  She can do a follow-up virtual visit after she does that lab work Nonemergent

## 2019-05-02 ENCOUNTER — Other Ambulatory Visit: Payer: Self-pay | Admitting: *Deleted

## 2019-05-02 MED ORDER — SIMVASTATIN 40 MG PO TABS: ORAL_TABLET | ORAL | 0 refills | Status: DC

## 2019-05-02 NOTE — Telephone Encounter (Signed)
Left message to return call 

## 2019-05-02 NOTE — Addendum Note (Signed)
Addended by: Carmelina Noun on: 05/02/2019 10:04 AM   Modules accepted: Orders

## 2019-05-02 NOTE — Telephone Encounter (Signed)
Discussed with pt. Pt verbalized understanding. Orders put in for bw and mailed to pt.

## 2019-05-12 ENCOUNTER — Other Ambulatory Visit: Payer: Self-pay | Admitting: Family Medicine

## 2019-06-06 ENCOUNTER — Ambulatory Visit (INDEPENDENT_AMBULATORY_CARE_PROVIDER_SITE_OTHER): Payer: Commercial Managed Care - PPO | Admitting: Family Medicine

## 2019-06-06 DIAGNOSIS — R7303 Prediabetes: Secondary | ICD-10-CM | POA: Diagnosis not present

## 2019-06-06 DIAGNOSIS — E782 Mixed hyperlipidemia: Secondary | ICD-10-CM

## 2019-06-06 MED ORDER — SIMVASTATIN 40 MG PO TABS: ORAL_TABLET | ORAL | 1 refills | Status: DC

## 2019-06-06 MED ORDER — SIMVASTATIN 40 MG PO TABS: ORAL_TABLET | ORAL | 0 refills | Status: DC

## 2019-06-06 NOTE — Progress Notes (Signed)
   Subjective:    Patient ID: Monica Russell, female    DOB: [birth year], 58 y.o.   MRN: [id number]  HPI  Patient calls to discuss recent labs. Labs are scanned in Epic. Patient has no problems or concerns. Patient overall relates recently she has been feeling pretty good she denies any type of chest tightness pressure pain denies any recent sicknesses or illnesses the county does lab work for her.  Her potassi was high but on a recent follow-up the potassium is normal.  She states she is up-to-date on her flu vaccine.  Colonoscopy not due until 2023.  She does have a history of GERD as well as irritable bowel and also hyperlipidemia recent lab work looked good on her cholesterol Virtual Visit via Video Note  I connected with KALLIYAN GUNNETT on 06/06/19 at  8:30 AM EST by a video enabled telemedicine application and verified that I am speaking with the correct person using two identifiers.  Location: Patient: home  Provider: office   I discussed the limitations of evaluation and management by telemedicine and the availability of in person appointments. The patient expressed understanding and agreed to proceed.  History of Present Illness:    Observations/Objective:   Assessment and Plan:   Follow Up Instructions:    I discussed the assessment and treatment plan with the patient. The patient was provided an opportunity to ask questions and all were answered. The patient agreed with the plan and demonstrated an understanding of the instructions.   The patient was advised to call back or seek an in-person evaluation if the symptoms worsen or if the condition fails to improve as anticipated.  I provided 17 minutes of non-face-to-face time during this encounter.      Review of Systems  Constitutional: Negative for activity change, appetite change and fatigue.  HENT: Negative for congestion and rhinorrhea.   Respiratory: Negative for cough and shortness of breath.    Cardiovascular: Negative for chest pain and leg swelling.  Gastrointestinal: Negative for abdominal pain and diarrhea.  Endocrine: Negative for polydipsia and polyphagia.  Skin: Negative for color change.  Neurological: Negative for dizziness and weakness.  Psychiatric/Behavioral: Negative for behavioral problems and confusion.       Objective:   Physical Exam   Today's visit was via telephone Physical exam was not possible for this visit      Assessment & Plan:  Hyperlipidemia continue medication watch diet closely.  Recent labs look good follow-up again released within 1 year she typically follows up with nurse practitioner every 6 months with her work  Does have prediabetes glucose minimally elevated watch diet stay active keep weight check  Recent potassium is now normal.  She uses her Symbicort on a as needed basis when her asthma flares up no need to use it currently uses albuterol as needed as well

## 2019-07-19 ENCOUNTER — Other Ambulatory Visit (HOSPITAL_COMMUNITY): Payer: Self-pay | Admitting: Adult Health

## 2019-07-19 DIAGNOSIS — Z1231 Encounter for screening mammogram for malignant neoplasm of breast: Secondary | ICD-10-CM

## 2019-08-17 ENCOUNTER — Encounter: Payer: Self-pay | Admitting: Adult Health

## 2019-08-17 ENCOUNTER — Ambulatory Visit (HOSPITAL_COMMUNITY): Payer: Commercial Managed Care - PPO

## 2019-08-17 ENCOUNTER — Ambulatory Visit (INDEPENDENT_AMBULATORY_CARE_PROVIDER_SITE_OTHER): Payer: Commercial Managed Care - PPO | Admitting: Adult Health

## 2019-08-17 ENCOUNTER — Other Ambulatory Visit: Payer: Self-pay

## 2019-08-17 VITALS — BP 130/75 | HR 83 | Ht 67.0 in | Wt 155.4 lb

## 2019-08-17 DIAGNOSIS — N644 Mastodynia: Secondary | ICD-10-CM | POA: Diagnosis not present

## 2019-08-17 DIAGNOSIS — N6321 Unspecified lump in the left breast, upper outer quadrant: Secondary | ICD-10-CM | POA: Diagnosis not present

## 2019-08-17 NOTE — Progress Notes (Signed)
  Subjective:     Patient ID: Monica Russell, female   DOB: [birth year], 59 y.o.   MRN: [id number]  HPI Monica Russell is a 59 year old white female, married, PM in complaining of painful left breast. Has had history of cyst in this breast that was drained. She retired last Friday from [employer], husband has dementia. She has a grand daughter now,was born October 2020. PCP is Dr Sallee Lange.   Review of Systems Has painful left breast  Reviewed past medical,surgical, social and family history. Reviewed medications and allergies.     Objective:   Physical Exam BP 130/75 (BP Location: Left Arm, Patient Position: Sitting, Cuff Size: Normal)   Pulse 83   Ht 5\' 7"  (1.702 m)   Wt 155 lb 6.4 oz (70.5 kg)   BMI 24.34 kg/m   Skin warm and dry,  Breasts:no dominate palpable mass, retraction or nipple discharge on the right, on the left, no retraction or nipple discharge, has tender 4 cm mass at about 3 0' clock.   Fall risk is low PHQ 2 score is 0.   Assessment:     1. Mass of upper outer quadrant of left breast Bilateral diagnostic mammogram and left and right [place] scheduled 2/9 at 8 am at [facility]  2. Pain of left breast     Plan:    Diagnostic bilateral Mammogram and left and right [place] 08/23/19 at 8 am at [facility] Follow up prn

## 2019-08-23 ENCOUNTER — Telehealth: Payer: Self-pay | Admitting: Adult Health

## 2019-08-23 ENCOUNTER — Other Ambulatory Visit: Payer: Self-pay | Admitting: Adult Health

## 2019-08-23 ENCOUNTER — Ambulatory Visit (HOSPITAL_COMMUNITY)
Admission: RE | Admit: 2019-08-23 | Discharge: 2019-08-23 | Disposition: A | Discharge status: Home / Self Care | Payer: Commercial Managed Care - PPO | Source: Ambulatory Visit | Attending: Adult Health | Admitting: Adult Health

## 2019-08-23 ENCOUNTER — Other Ambulatory Visit: Payer: Self-pay

## 2019-08-23 ENCOUNTER — Ambulatory Visit (HOSPITAL_COMMUNITY): Payer: Commercial Managed Care - PPO

## 2019-08-23 ENCOUNTER — Other Ambulatory Visit: Payer: Self-pay | Admitting: Family Medicine

## 2019-08-23 DIAGNOSIS — N6321 Unspecified lump in the left breast, upper outer quadrant: Secondary | ICD-10-CM

## 2019-08-23 DIAGNOSIS — N644 Mastodynia: Secondary | ICD-10-CM

## 2019-08-23 NOTE — Telephone Encounter (Signed)
Left message that I saw that she will be getting a breast biopsy next week, and call me if she needs anything

## 2019-08-30 ENCOUNTER — Ambulatory Visit (HOSPITAL_COMMUNITY)
Admission: RE | Admit: 2019-08-30 | Discharge: 2019-08-30 | Disposition: A | Discharge status: Home / Self Care | Payer: Commercial Managed Care - PPO | Source: Ambulatory Visit | Attending: Adult Health | Admitting: Adult Health

## 2019-08-30 ENCOUNTER — Inpatient Hospital Stay (HOSPITAL_COMMUNITY): Admission: RE | Admit: 2019-08-30 | Payer: Commercial Managed Care - PPO | Source: Ambulatory Visit

## 2019-08-30 ENCOUNTER — Other Ambulatory Visit (HOSPITAL_COMMUNITY): Payer: Self-pay | Admitting: Family Medicine

## 2019-08-30 ENCOUNTER — Other Ambulatory Visit: Payer: Self-pay

## 2019-08-30 ENCOUNTER — Ambulatory Visit (HOSPITAL_COMMUNITY)
Admission: RE | Admit: 2019-08-30 | Discharge: 2019-08-30 | Disposition: A | Discharge status: Home / Self Care | Payer: Commercial Managed Care - PPO | Source: Ambulatory Visit | Attending: Family Medicine | Admitting: Family Medicine

## 2019-08-30 DIAGNOSIS — C50812 Malignant neoplasm of overlapping sites of left female breast: Secondary | ICD-10-CM | POA: Diagnosis not present

## 2019-08-30 DIAGNOSIS — C773 Secondary and unspecified malignant neoplasm of axilla and upper limb lymph nodes: Secondary | ICD-10-CM | POA: Diagnosis not present

## 2019-08-30 DIAGNOSIS — R928 Other abnormal and inconclusive findings on diagnostic imaging of breast: Secondary | ICD-10-CM

## 2019-08-30 DIAGNOSIS — N6321 Unspecified lump in the left breast, upper outer quadrant: Secondary | ICD-10-CM

## 2019-08-30 DIAGNOSIS — N644 Mastodynia: Secondary | ICD-10-CM

## 2019-08-30 MED ORDER — LIDOCAINE-EPINEPHRINE (PF) 1 %-1:200000 IJ SOLN
INTRAMUSCULAR | Status: AC
  Filled 2019-08-30: qty 30

## 2019-08-30 MED ORDER — SODIUM BICARBONATE 4.2 % IV SOLN
INTRAVENOUS | Status: AC
  Filled 2019-08-30: qty 10

## 2019-08-30 MED ORDER — LIDOCAINE HCL (PF) 2 % IJ SOLN
INTRAMUSCULAR | Status: AC
  Filled 2019-08-30: qty 10

## 2019-09-02 ENCOUNTER — Telehealth: Payer: Self-pay | Admitting: Adult Health

## 2019-09-02 NOTE — Telephone Encounter (Signed)
Pt aware of breast cancer, has appt with Dr Donne Hazel 09/09/19.

## 2019-09-05 LAB — SURGICAL PATHOLOGY

## 2019-09-09 ENCOUNTER — Other Ambulatory Visit: Payer: Self-pay | Admitting: General Surgery

## 2019-09-09 DIAGNOSIS — N6321 Unspecified lump in the left breast, upper outer quadrant: Secondary | ICD-10-CM

## 2019-09-14 NOTE — Progress Notes (Signed)
Location of Breast Cancer: Upper outer quadrant left breast  Did patient present with symptoms (if so, please note symptoms) or was this found on screening mammography?: She noticed a sensitive area with thickening in last month in the left breast.  MRI Breast 09/20/2019:  [place] 08/23/2019: 1.4 x 1 x 0.7 cm mass and a single abnormal axillary node with fct of 0.5 cm.  Mammogram 08/23/2019: B density breasts.  Irregular mass in the upper outer quadrant of left breast.  Histology per Pathology Report: Left Breast  Receptor Status: ER(+), PR (+), Her2-neu (-), Ki-67(2%)    Past/Anticipated interventions by surgeon, if any: Dr. Donne Hazel 09/09/2019 - MRI due to lobular cancer, likely right seed guided lumpectomy, right TAD. -We discussed TAD for her nodes which I think is reasonable combining seed guided node excision of pos node and sn biopsy. -We discussed the performance of that with injection of radioactive tracer. -She understands her further therapy will be based on what her stage is at the time of her operation. -We also discussed risk of needing more biopsies after MRI. -Will refer to rad onc in [place] and Dr. Delton Coombes at [facility]. -Surgery unscheduled at this time.   Past/Anticipated interventions by medical oncology, if any: Chemotherapy  Dr. Delton Coombes 09/22/2019 1pm   Lymphedema issues, if any:  No  Pain issues, if any:  No  SAFETY ISSUES:  Prior radiation? No  Pacemaker/ICD? No  Possible current pregnancy? Post menopausal  Is the patient on methotrexate? No  Current Complaints / other details:      Cori Razor, RN 09/14/2019,11:31 AM

## 2019-09-15 ENCOUNTER — Encounter: Payer: Self-pay | Admitting: Radiation Oncology

## 2019-09-15 ENCOUNTER — Other Ambulatory Visit: Payer: Self-pay

## 2019-09-15 ENCOUNTER — Ambulatory Visit
Admission: RE | Admit: 2019-09-15 | Discharge: 2019-09-15 | Disposition: A | Discharge status: Home / Self Care | Payer: Commercial Managed Care - PPO | Source: Ambulatory Visit | Attending: Radiation Oncology | Admitting: Radiation Oncology

## 2019-09-15 ENCOUNTER — Encounter (HOSPITAL_COMMUNITY): Payer: Self-pay | Admitting: *Deleted

## 2019-09-15 VITALS — Ht 67.0 in | Wt 153.0 lb

## 2019-09-15 DIAGNOSIS — Z170 Estrogen receptor positive status [ER+]: Secondary | ICD-10-CM

## 2019-09-15 DIAGNOSIS — C50412 Malignant neoplasm of upper-outer quadrant of left female breast: Secondary | ICD-10-CM

## 2019-09-15 NOTE — Progress Notes (Signed)
Oncology Navigator Note:  Patient was referred to our office for breast cancer.  I called patient today to introduce myself and provide information in how I will be involved with their care.  I had to leave her a VM but I left a brief introduction and asked that she return my call.  I left my contact information so she can reach me at her earliest convenience.

## 2019-09-15 NOTE — Progress Notes (Signed)
Radiation Oncology         [phone] ________________________________  Initial Outpatient Consultation - Conducted via telephone due to current COVID-19 concerns for limiting patient exposure  I spoke with the patient to conduct this consult visit via telephone to spare the patient unnecessary potential exposure in the healthcare setting during the current COVID-19 pandemic. The patient was notified in advance and was offered a [place] meeting to allow for face to face communication but unfortunately reported that they did not have the appropriate resources/technology to support such a visit and instead preferred to proceed with a telephone consult.   Name: Monica Russell        MRN: [id number]  Date of Service: 09/15/2019 DOB: 04/10/1961  BJ:[id number], [name], MD  Rolm Bookbinder, MD     REFERRING PHYSICIAN: Rolm Bookbinder, MD   DIAGNOSIS: There were no encounter diagnoses.   HISTORY OF PRESENT ILLNESS: Monica Russell is a 59 y.o. female with a new diagnosis of left breast cancer. The patient was noted to have a palpable thickening in the upper outer left breast.  Diagnostic imaging revealed an irregular mass in the upper outer left breast.  On exam a focal area of thickening was identified at 12:00 in the left breast 4 cm from the nipple.  Ultrasound on 08/23/2019 revealed a 1 x 1.4 x 0.7 cm hypoechoic mass at the 12 o'clock position left breast.  A single abnormal node was seen in the left axilla with 5 mm of focal cortical thickening. A biopsy on 08/30/2019 of the breast at 12:00 revealed an invasive grade 2 lobular carcinoma.  Her tumor was ER/PR positive with HER2 negative findings by FISH. Her Ki 67 was 2%.  A lymph node in the axilla was also biopsied consistent with disease.  She met with Dr. Donne Hazel and has been referred to discuss recommendations of treatment for her cancer. She is scheduled to see Dr. Delton Coombes at [facility] next Thursday. She is also proceeding with MRI of the  breast on 09/20/19.   PREVIOUS RADIATION THERAPY: No   PAST MEDICAL HISTORY:  Past Medical History:  Diagnosis Date  . High cholesterol   . IBS (irritable bowel syndrome)   . Renal cell cancer ([place])        PAST SURGICAL HISTORY: Past Surgical History:  Procedure Laterality Date  . CHOLECYSTECTOMY    . COLONOSCOPY  04/28/2012   Procedure: COLONOSCOPY;  Surgeon: Rogene Houston, MD;  Location: [facility];  Service: Endoscopy;  Laterality: N/A;  155  . PARTIAL COLECTOMY     for diverticulitis  . PARTIAL NEPHRECTOMY     11 yrs ago for a renal carcinoma, right     FAMILY HISTORY:  Family History  Problem Relation Age of Onset  . Hypertension Mother   . Mental illness Mother   . Heart disease Maternal Grandmother   . Diabetes Maternal Grandmother   . Heart disease Maternal Grandfather   . Dementia Maternal Grandfather      SOCIAL HISTORY:  reports that she has been smoking cigarettes. She has a 40.00 pack-year smoking history. She has never used smokeless tobacco. She reports that she does not drink alcohol or use drugs. The patient is married. Her husband has dementia. She is recently retired from [employer], and lives in [place].    ALLERGIES: Penicillins   MEDICATIONS:  Current Outpatient Medications  Medication Sig Dispense Refill  . albuterol (PROVENTIL) (2.5 MG/3ML) 0.083% nebulizer solution Use via  neb q 4 hours prn wheezing 50 vial 2  . simvastatin (ZOCOR) 40 MG tablet TAKE 1 TABLET BY MOUTH EVERY DAY FOR CHOLESTEROL 90 tablet 1  . SYMBICORT 80-4.5 MCG/ACT inhaler TAKE 2 PUFFS BY MOUTH TWICE A DAY 10.2 Inhaler 0   No current facility-administered medications for this encounter.     REVIEW OF SYSTEMS: On review of systems, the patient reports that she is doing well overall. She denies any chest pain, shortness of breath, cough, fevers, chills, night sweats, unintended weight changes. She denies any bowel or bladder disturbances, and  denies abdominal pain, nausea or vomiting. She denies any new musculoskeletal or joint aches or pains. A complete review of systems is obtained and is otherwise negative.     PHYSICAL EXAM:  Unable to assess due to encounter type.   ECOG = 0  0 - Asymptomatic (Fully active, able to carry on all predisease activities without restriction)  1 - Symptomatic but completely ambulatory (Restricted in physically strenuous activity but ambulatory and able to carry out work of a light or sedentary nature. For example, light housework, office work)  2 - Symptomatic, <50% in bed during the day (Ambulatory and capable of all self care but unable to carry out any work activities. Up and about more than 50% of waking hours)  3 - Symptomatic, >50% in bed, but not bedbound (Capable of only limited self-care, confined to bed or chair 50% or more of waking hours)  4 - Bedbound (Completely disabled. Cannot carry on any self-care. Totally confined to bed or chair)  5 - Death   Eustace Pen MM, Creech RH, Tormey DC, et al. [phone]). "Toxicity and response criteria of the [facility]". Susan Moore Oncol. 5 (6): 649-55    LABORATORY DATA:  Lab Results  Component Value Date   WBC 7.4 03/07/2015   HGB 13.1 03/07/2015   HCT 39.2 03/07/2015   MCV 87.3 03/07/2015   PLT 286 03/07/2015   Lab Results  Component Value Date   NA 140 08/23/2018   K 4.9 08/23/2018   CL 102 08/23/2018   CO2 23 08/23/2018   Lab Results  Component Value Date   ALT 12 03/21/2013   AST 13 03/21/2013   ALKPHOS 66 03/21/2013   BILITOT 0.5 03/21/2013      RADIOGRAPHY: US BREAST LTD UNI LEFT INC AXILLA  Result Date: 08/23/2019 CLINICAL DATA:  59 year old female with palpable thickening in the UPPER OUTER LEFT breast discovered on clinical examination. Also for annual bilateral mammogram. EXAM: DIGITAL DIAGNOSTIC BILATERAL MAMMOGRAM WITH CAD AND TOMO ULTRASOUND LEFT BREAST COMPARISON:  Previous exam(s). ACR  Breast Density Category b: There are scattered areas of fibroglandular density. FINDINGS: 2D/3D full field views of both breasts and spot compression view of the LEFT breast demonstrate an irregular mass within the UPPER OUTER LEFT breast, underlying the BB placed at site of palpable concern. No suspicious calcifications are identified. No abnormalities are identified within the RIGHT breast. Mammographic images were processed with CAD. On physical exam, a firm area of focal thickening is identified at the 12 o'clock position of the LEFT breast 4 cm from the nipple. Targeted ultrasound is performed, showing a 1 x 1.4 x 0.7 cm irregular hypoechoic mass at the 12 o'clock position of the LEFT breast 4 cm from the nipple. A single abnormal LEFT axillary lymph node with focal cortical thickening of 0.5 cm is noted. IMPRESSION: 1. Highly suspicious 1.4 cm mass within the UPPER LEFT breast, corresponding  to the patient's palpable abnormality. Single abnormal LEFT axillary lymph node. Tissue sampling of the UPPER LEFT breast mass and abnormal LEFT axillary lymph node recommended. 2. No suspicious mammographic abnormalities within the RIGHT breast. RECOMMENDATION: Ultrasound-guided LEFT breast and LEFT axillary lymph node biopsies, which will be scheduled. I have discussed the findings and recommendations with the patient. If applicable, a reminder letter will be sent to the patient regarding the next appointment. BI-RADS CATEGORY  5: Highly suggestive of malignancy. Electronically Signed   By: Margarette Canada M.D.   On: 08/23/2019 08:46   MM DIAG BREAST TOMO BILATERAL  Result Date: 08/23/2019 CLINICAL DATA:  60 year old female with palpable thickening in the UPPER OUTER LEFT breast discovered on clinical examination. Also for annual bilateral mammogram. EXAM: DIGITAL DIAGNOSTIC BILATERAL MAMMOGRAM WITH CAD AND TOMO ULTRASOUND LEFT BREAST COMPARISON:  Previous exam(s). ACR Breast Density Category b: There are scattered areas  of fibroglandular density. FINDINGS: 2D/3D full field views of both breasts and spot compression view of the LEFT breast demonstrate an irregular mass within the UPPER OUTER LEFT breast, underlying the BB placed at site of palpable concern. No suspicious calcifications are identified. No abnormalities are identified within the RIGHT breast. Mammographic images were processed with CAD. On physical exam, a firm area of focal thickening is identified at the 12 o'clock position of the LEFT breast 4 cm from the nipple. Targeted ultrasound is performed, showing a 1 x 1.4 x 0.7 cm irregular hypoechoic mass at the 12 o'clock position of the LEFT breast 4 cm from the nipple. A single abnormal LEFT axillary lymph node with focal cortical thickening of 0.5 cm is noted. IMPRESSION: 1. Highly suspicious 1.4 cm mass within the UPPER LEFT breast, corresponding to the patient's palpable abnormality. Single abnormal LEFT axillary lymph node. Tissue sampling of the UPPER LEFT breast mass and abnormal LEFT axillary lymph node recommended. 2. No suspicious mammographic abnormalities within the RIGHT breast. RECOMMENDATION: Ultrasound-guided LEFT breast and LEFT axillary lymph node biopsies, which will be scheduled. I have discussed the findings and recommendations with the patient. If applicable, a reminder letter will be sent to the patient regarding the next appointment. BI-RADS CATEGORY  5: Highly suggestive of malignancy. Electronically Signed   By: Margarette Canada M.D.   On: 08/23/2019 08:46   MM CLIP PLACEMENT LEFT  Result Date: 08/30/2019 CLINICAL DATA:  Post ultrasound-guided biopsy of a suspicious mass in the left breast at the 12 o'clock position and ultrasound-guided biopsy of an indeterminate lymph node in the left axilla. EXAM: DIAGNOSTIC LEFT MAMMOGRAM POST ULTRASOUND BIOPSY COMPARISON:  Previous exam(s). FINDINGS: Mammographic images were obtained following ultrasound guided biopsy of a suspicious mass in the left breast  at the 12 o'clock position and ultrasound-guided biopsy of an indeterminate lymph node in the left axilla. A ribbon shaped biopsy marking clip is present at the site of the biopsied mass in the left breast at the 12 o'clock position. A HydroMARK biopsy marking clip is present at the site of the biopsied lymph node in the left axilla. IMPRESSION: 1. Ribbon shaped biopsy marking clip at site of biopsied mass in the left breast at the 12 o'clock position. 2. HydroMARK biopsy marking clip at site of biopsied lymph node in the left axilla. Final Assessment: Post Procedure Mammograms for Marker Placement Electronically Signed   By: Everlean Alstrom M.D.   On: 08/30/2019 09:05   [place] LT BREAST BX W LOC DEV 1ST LESION IMG BX SPEC US GUIDE  Addendum Date: 09/06/2019  ADDENDUM REPORT: 08/31/2019 13:19 ADDENDUM: PATHOLOGY revealed: A. BREAST, LEFT, 12:00, BIOPSY: - Invasive mammary carcinoma. B. LYMPH NODE, AXILLA, LEFT, BIOPSY: - Metastatic carcinoma. COMMENT: A. The carcinoma appears grade 2 and measures 8 mm in greatest extent. E-cadherin will be ordered. Prognostic markers will be ordered. Dr. Jeannie Done has reviewed the case. Pathology results are CONCORDANT with imaging findings, per Dr. Everlean Alstrom. Pathology results and recommendations below were discussed with patient by telephone on 08/31/2019. Patient reported biopsy site doing well with slight tenderness at the site. Post biopsy care instructions were reviewed and questions were answered. Patient was instructed to call [facility] Mammography Department if any concerns or questions arise related to the biopsy. Recommendation: Surgical referral. Request for surgical referral was relayed to Kathi Der RT at [facility] Mammography Department by Electa Sniff RN on 08/31/2019. Addendum by Electa Sniff RN on 08/31/2019. Electronically Signed   By: Everlean Alstrom M.D.   On: 08/31/2019 13:19   Result Date: 09/06/2019 CLINICAL DATA:   59 year old female with a suspicious palpable mass in the left breast 12 o'clock position an indeterminate lymph node in the left axilla. EXAM: ULTRASOUND GUIDED LEFT BREAST CORE NEEDLE BIOPSY ULTRASOUND-GUIDED LEFT AXILLA CORE NEEDLE BIOPSY COMPARISON:  Previous exam(s). FINDINGS: I met with the patient and we discussed the procedure of ultrasound-guided biopsy, including benefits and alternatives. We discussed the high likelihood of a successful procedure. We discussed the risks of the procedure, including infection, bleeding, tissue injury, clip migration, and inadequate sampling. Informed written consent was given. The usual time-out protocol was performed immediately prior to the procedure. SITE 1: LEFT BREAST 12 O'CLOCK: MASS: Lesion quadrant: UPPER-OUTER Using sterile technique and 1% Lidocaine as local anesthetic, under direct ultrasound visualization, a 14 gauge spring-loaded device was used to perform biopsy of the mass in the left breast at the 12 o'clock position using a lateral to medial approach. At the conclusion of the procedure a ribbon shaped tissue marker clip was deployed into the biopsy cavity. Follow up 2 view mammogram was performed and dictated separately. SITE 2: LEFT AXILLA: LYMPH NODE: Lesion quadrant: UPPER-OUTER Using sterile technique and 1% Lidocaine as local anesthetic, under direct ultrasound visualization, a 14 gauge spring-loaded device was used to perform biopsy of the morphologically abnormal lymph node in the left axilla using a lateral to medial approach. At the conclusion of the procedure [facility] tissue marker clip was deployed into the biopsy cavity. Follow up 2 view mammogram was performed and dictated separately. IMPRESSION: 1. Ultrasound-guided biopsy of the palpable mass in the left breast at the 12 o'clock position, at site of ribbon shaped biopsy marking clip. 2. Ultrasound-guided biopsy of the indeterminate lymph node in the left axilla, at site of [facility] biopsy  marking clip. Electronically Signed: By: Everlean Alstrom M.D. On: 08/30/2019 08:54   [place] LT BREAST BX W LOC DEV EA ADD LESION IMG BX SPEC US GUIDE  Addendum Date: 09/06/2019   ADDENDUM REPORT: 08/31/2019 13:19 ADDENDUM: PATHOLOGY revealed: A. BREAST, LEFT, 12:00, BIOPSY: - Invasive mammary carcinoma. B. LYMPH NODE, AXILLA, LEFT, BIOPSY: - Metastatic carcinoma. COMMENT: A. The carcinoma appears grade 2 and measures 8 mm in greatest extent. E-cadherin will be ordered. Prognostic markers will be ordered. Dr. Jeannie Done has reviewed the case. Pathology results are CONCORDANT with imaging findings, per Dr. Everlean Alstrom. Pathology results and recommendations below were discussed with patient by telephone on 08/31/2019. Patient reported biopsy site doing well with slight tenderness at the site. Post  biopsy care instructions were reviewed and questions were answered. Patient was instructed to call [facility] Mammography Department if any concerns or questions arise related to the biopsy. Recommendation: Surgical referral. Request for surgical referral was relayed to Kathi Der RT at [facility] Mammography Department by Electa Sniff RN on 08/31/2019. Addendum by Electa Sniff RN on 08/31/2019. Electronically Signed   By: Everlean Alstrom M.D.   On: 08/31/2019 13:19   Result Date: 09/06/2019 CLINICAL DATA:  59 year old female with a suspicious palpable mass in the left breast 12 o'clock position an indeterminate lymph node in the left axilla. EXAM: ULTRASOUND GUIDED LEFT BREAST CORE NEEDLE BIOPSY ULTRASOUND-GUIDED LEFT AXILLA CORE NEEDLE BIOPSY COMPARISON:  Previous exam(s). FINDINGS: I met with the patient and we discussed the procedure of ultrasound-guided biopsy, including benefits and alternatives. We discussed the high likelihood of a successful procedure. We discussed the risks of the procedure, including infection, bleeding, tissue injury, clip migration, and inadequate sampling.  Informed written consent was given. The usual time-out protocol was performed immediately prior to the procedure. SITE 1: LEFT BREAST 12 O'CLOCK: MASS: Lesion quadrant: UPPER-OUTER Using sterile technique and 1% Lidocaine as local anesthetic, under direct ultrasound visualization, a 14 gauge spring-loaded device was used to perform biopsy of the mass in the left breast at the 12 o'clock position using a lateral to medial approach. At the conclusion of the procedure a ribbon shaped tissue marker clip was deployed into the biopsy cavity. Follow up 2 view mammogram was performed and dictated separately. SITE 2: LEFT AXILLA: LYMPH NODE: Lesion quadrant: UPPER-OUTER Using sterile technique and 1% Lidocaine as local anesthetic, under direct ultrasound visualization, a 14 gauge spring-loaded device was used to perform biopsy of the morphologically abnormal lymph node in the left axilla using a lateral to medial approach. At the conclusion of the procedure [facility] tissue marker clip was deployed into the biopsy cavity. Follow up 2 view mammogram was performed and dictated separately. IMPRESSION: 1. Ultrasound-guided biopsy of the palpable mass in the left breast at the 12 o'clock position, at site of ribbon shaped biopsy marking clip. 2. Ultrasound-guided biopsy of the indeterminate lymph node in the left axilla, at site of [facility] biopsy marking clip. Electronically Signed: By: Everlean Alstrom M.D. On: 08/30/2019 08:54       IMPRESSION/PLAN: 1. Stage IB, [id number] grade 2, ER/PR positive invasive lobular carcinoma of the left breast. Dr. Lisbeth Renshaw discusses the pathology findings and reviews the nature of left breast disease. The consensus from the breast conference includes proceeding with MRI of the breast to determine extent of disease given her lobular phenotype. She may be a candidate for  breast conservation with lumpectomy with targeted axillary dissection. The patient sees Dr Delton Coombes next week and he will  determine if the tumor should be tested for Oncotype Dx score to determine a role for systemic therapy. Provided that chemotherapy is not indicated, the patient's course would then be followed by external radiotherapy to the breast followed by antiestrogen therapy to reduce risks of recurrence. We discussed the risks, benefits, short, and long term effects of radiotherapy, and the patient is interested in proceeding. Dr. Lisbeth Renshaw discusses the delivery and logistics of radiotherapy and anticipates a course of 6 1/2 weeks of radiotherapy to the left breast and regional nodes. We will see her back about 2 weeks after surgery, or following any needed systemic therapy to discuss the simulation process and anticipate we starting radiotherapy about 4-6 weekst thereafter.  Given current concerns for patient exposure during the COVID-19 pandemic, this encounter was conducted via telephone.  The patient has provided two factor identification and has given verbal consent for this type of encounter and has been advised to only accept a meeting of this type in a secure network environment. The time spent during this encounter was 45 minutes including preparation, discussion, and coordination of the patient's care. The attendants for this meeting include Blenda Nicely, RN, Dr. Lisbeth Renshaw, Hayden Pedro  and Hulda Humphrey.  During the encounter,  Blenda Nicely, RN, Dr. Lisbeth Renshaw, and Hayden Pedro were located at [facility] Radiation Oncology Department.  Monica Russell was located at home.    The above documentation reflects my direct findings during this shared patient visit. Please see the separate note by Dr. Lisbeth Renshaw on this date for the remainder of the patient's plan of care.    Carola Rhine, PAC

## 2019-09-16 ENCOUNTER — Encounter (HOSPITAL_COMMUNITY): Payer: Self-pay | Admitting: *Deleted

## 2019-09-16 NOTE — Progress Notes (Signed)
Oncology Navigator Note:  Patient was referred to our office for breast cancer.  I called patient today to introduce myself and provide information in how I will be involved with her care.  I provided information on her first visit and what to expect.  I made sure patient was aware of appointment time and directions to the cancer center.  My phone number was given so that she can call me with any questions or concerns.  She voices appreciation and understanding.

## 2019-09-20 ENCOUNTER — Ambulatory Visit (HOSPITAL_COMMUNITY)
Admission: RE | Admit: 2019-09-20 | Discharge: 2019-09-20 | Disposition: A | Discharge status: Home / Self Care | Payer: Commercial Managed Care - PPO | Source: Ambulatory Visit | Attending: General Surgery | Admitting: General Surgery

## 2019-09-20 ENCOUNTER — Inpatient Hospital Stay (HOSPITAL_COMMUNITY): Payer: Commercial Managed Care - PPO | Attending: Hematology | Admitting: General Practice

## 2019-09-20 ENCOUNTER — Other Ambulatory Visit: Payer: Self-pay

## 2019-09-20 ENCOUNTER — Ambulatory Visit (HOSPITAL_COMMUNITY): Payer: Commercial Managed Care - PPO

## 2019-09-20 DIAGNOSIS — N6321 Unspecified lump in the left breast, upper outer quadrant: Secondary | ICD-10-CM

## 2019-09-20 DIAGNOSIS — Z85528 Personal history of other malignant neoplasm of kidney: Secondary | ICD-10-CM | POA: Insufficient documentation

## 2019-09-20 DIAGNOSIS — F17210 Nicotine dependence, cigarettes, uncomplicated: Secondary | ICD-10-CM | POA: Insufficient documentation

## 2019-09-20 DIAGNOSIS — Z79899 Other long term (current) drug therapy: Secondary | ICD-10-CM | POA: Insufficient documentation

## 2019-09-20 DIAGNOSIS — C773 Secondary and unspecified malignant neoplasm of axilla and upper limb lymph nodes: Secondary | ICD-10-CM | POA: Insufficient documentation

## 2019-09-20 DIAGNOSIS — Z170 Estrogen receptor positive status [ER+]: Secondary | ICD-10-CM | POA: Insufficient documentation

## 2019-09-20 DIAGNOSIS — C50412 Malignant neoplasm of upper-outer quadrant of left female breast: Secondary | ICD-10-CM | POA: Insufficient documentation

## 2019-09-20 LAB — POCT I-STAT CREATININE: Creatinine, Ser: 0.6 mg/dL (ref 0.44–1.00)

## 2019-09-20 MED ORDER — GADOBUTROL 1 MMOL/ML IV SOLN
7.5000 mL | Freq: Once | INTRAVENOUS | Status: AC | PRN
  Administered 2019-09-20: 7.5 mL via INTRAVENOUS

## 2019-09-20 NOTE — Progress Notes (Signed)
[place] Psychosocial Distress Screening Clinical Social Work  Clinical Social Work was referred by distress screening protocol.  The patient scored a 10 on the Psychosocial Distress Thermometer which indicates severe distress. Clinical Social Worker contacted patient by phone to assess for distress and other psychosocial needs.   ONCBCN DISTRESS SCREENING 09/15/2019  Screening Type Initial Screening  Distress experienced in past week (1-10) 10  Emotional problem type Adjusting to illness  Information Concerns Type Lack of info about treatment;Lack of info about diagnosis  Other Contact via phone.    Barriers to care/review of distress screen:  - Transportation:  Do you anticipate any problems getting to appointments?  Do you have someone who can help run errands for you if you need it?  No issues w transport. - Help at home:  What is your living situation (alone, family, other)?  If you are physically unable to care for yourself, who would you call on to help you?  Lives w spouse, has son that lives 10 minutes from her.   - Support system:  What does your support system look like?  Who would you call on if you needed some kind of practical help?  What if you needed someone to talk to for emotional support?  Has family support, has friends who have offered to "do whatever they need to do."  Adhering to pandemic restrictions, limiting contact w outside relationships.   - Finances:  Are you concerned about finances.  Considering returning to work?  If not, applying for disability?  Retired from [employer] in January 2021.    What is your understanding of where you are with your cancer? Its cause?  Your treatment plan and what happens next?  Had partial nephrectomy - found "small place on my kidney when I had my gall bladder taken out."  Was treated by surgery, had 5 years of follow up.  In late January she felt a small place of concern, brought to medical attention, "not a big worrier", wants to stay positive.   "You can make yourself sick by worrying, but I have been a little concerned."  Felt better after she saw surgeon.  Current treatment plan is radiation, aware that she will need 6 weeks of radiation in [place].   What are your worries for the future as you begin treatment for cancer?  Afraid "it is worse than they say it is."  Awaiting results from today's MRI.  What are your hopes and priorities during your treatment? What is important to you? What are your goals for your care?   Keeps infant grandchild in afternoon. Wonders if she will be able to continue to do this.  Looking forward to summer weather and gardening outside.    CSW Summary:  Patient and family psychosocial functioning including strengths, limitations, and coping skills:  59 year old female, newly diagnosed w breast cancer.  Recently retired, lives w husband, has supportive friends/son.  No issues anticipated re resources.  Has distant history of renal cancer treated by surgery - was surprised to learn the relatively minor symptoms she was experiencing in her breast have turned out to be cancer.  Was initially very distressed - this has lessened after meeting w surgeon and learning plan for radiation therapy.  Has MRI scheduled for today - depending on results, she is aware that chemotherapy may be recommended.  Keeping a positive attitude and outlook on this - feels she has all the resources she needs to deal with diagnosis and treatment.  CSW and patient discussed common feeling and emotions when being diagnosed with cancer, and the importance of support during treatment. CSW informed patient of the support team and support services at [facility], emailed March calendar. CSW provided contact information and encouraged patient to call with any questions or concerns.  Identifications of barriers to care:  None noted  Availability of community resources: None needed  Clinical Social Worker follow up needed: No, please reconsult if needed.       Beverely Pace, [place], [place] Social Worker Phone:  [phone] Cell:  [phone]

## 2019-09-21 ENCOUNTER — Encounter (HOSPITAL_COMMUNITY): Payer: Self-pay | Admitting: Surgery

## 2019-09-22 ENCOUNTER — Inpatient Hospital Stay (HOSPITAL_BASED_OUTPATIENT_CLINIC_OR_DEPARTMENT_OTHER): Payer: Commercial Managed Care - PPO | Admitting: Hematology

## 2019-09-22 ENCOUNTER — Encounter (HOSPITAL_COMMUNITY): Payer: Self-pay | Admitting: Hematology

## 2019-09-22 ENCOUNTER — Other Ambulatory Visit: Payer: Self-pay

## 2019-09-22 DIAGNOSIS — Z170 Estrogen receptor positive status [ER+]: Secondary | ICD-10-CM | POA: Diagnosis not present

## 2019-09-22 DIAGNOSIS — C773 Secondary and unspecified malignant neoplasm of axilla and upper limb lymph nodes: Secondary | ICD-10-CM

## 2019-09-22 DIAGNOSIS — Z85528 Personal history of other malignant neoplasm of kidney: Secondary | ICD-10-CM

## 2019-09-22 DIAGNOSIS — C50412 Malignant neoplasm of upper-outer quadrant of left female breast: Secondary | ICD-10-CM

## 2019-09-22 DIAGNOSIS — F17210 Nicotine dependence, cigarettes, uncomplicated: Secondary | ICD-10-CM | POA: Diagnosis not present

## 2019-09-22 DIAGNOSIS — Z79899 Other long term (current) drug therapy: Secondary | ICD-10-CM | POA: Diagnosis not present

## 2019-09-22 NOTE — Progress Notes (Signed)
[facility] CONSULT NOTE  Patient Care Team: Kathyrn Drown, MD as PCP - General (Family Medicine) Donetta Potts, RN as Oncology Nurse Navigator (Oncology) Derek Jack, MD as Medical Oncologist (Oncology)  CHIEF COMPLAINTS/PURPOSE OF CONSULTATION:  Newly diagnosed left breast cancer  HISTORY OF PRESENTING ILLNESS:  Monica Russell 59 y.o. female is seen in consultation today at the request of Dr. Donne Hazel for further management of newly diagnosed left breast infiltrating lobular carcinoma.  She reportedly felt thickening in her left breast and underwent mammogram on 08/23/2019 which showed 1.4 cm mass in the upper left breast with a single abnormal left axillary lymph node.  Biopsy of the left breast 12 o'clock position showed grade 2 invasive mammary carcinoma, ER 100% positive, PR 70% positive, Ki-67-2%.  HER-2 was 2+ by IHC.  FISH was negative.  E-cadherin was negative consistent with invasive lobular carcinoma.  Biopsy of the left axillary lymph node was positive for metastatic carcinoma.  She was evaluated by Dr. Donne Hazel and MRI of the breast was ordered.  This was done on 09/20/2019 showing 2.2 cm mass in the upper outer left breast consistent with biopsy-proven malignancy.  There is a 1.5 cm focus of suspicious non-mass enhancement located 3 cm inferior and medial to the primary lesion.  Single morphologically abnormal left axillary lymph node consistent with biopsy-proven metastatic disease.  No suspicious MRI findings on the right breast.  She was recommended to have MR guided biopsy.  She reports that she had a previous left breast biopsy in 2018 and was found to have a benign cyst.  She already met with radiation oncology Dr. Lisbeth Renshaw by telephone consultation.  She worked at [employer] in [place] prior to retirement.  Reports appetite and energy levels of 100%.  I reviewed her records extensively and collaborated the history with the  patient.  SUMMARY OF ONCOLOGIC HISTORY: Oncology History   No history exists.    In terms of breast cancer risk profile:  She menarched at early age of 34 and went to menopause at age 50 She had 1 pregnancy, her first child was born at age 75 She received birth control pills for approximately 15 years.  She was never exposed to fertility medications or hormone replacement therapy.  She has no family history of Breast/GYN/GI cancer.  She does not know her dad side of the family.  MEDICAL HISTORY:  Past Medical History:  Diagnosis Date  . Breast cancer ([place])   . High cholesterol   . IBS (irritable bowel syndrome)   . Renal cell cancer [facility])     SURGICAL HISTORY: Past Surgical History:  Procedure Laterality Date  . CHOLECYSTECTOMY    . COLONOSCOPY  04/28/2012   Procedure: COLONOSCOPY;  Surgeon: Rogene Houston, MD;  Location[facility];  Service: Endoscopy;  Laterality: N/A;  155  . PARTIAL COLECTOMY     for diverticulitis  . PARTIAL NEPHRECTOMY     11 yrs ago for a renal carcinoma, right    SOCIAL HISTORY: Social History   Socioeconomic History  . Marital status: Married    Spouse name: Not on file  . Number of children: 2  . Years of education: Not on file  . Highest education level: Not on file  Occupational History  . Occupation: Retired  Tobacco Use  . Smoking status: Current Every Day Smoker    Packs/day: 0.50    Years: 40.00    Pack years: 20.00  Types: Cigarettes  . Smokeless tobacco: Never Used  . Tobacco comment: 10-12 a day  Substance and Sexual Activity  . Alcohol use: No    Alcohol/week: 0.0 standard drinks    Comment: rarely  . Drug use: No  . Sexual activity: Yes    Birth control/protection: Post-menopausal  Other Topics Concern  . Not on file  Social History Narrative   Pt's husband has dementia   Social Determinants of Health   Financial Resource Strain: Low Risk   . Difficulty of Paying Living Expenses: Not hard at all  Food  Insecurity: No Food Insecurity  . Worried About Charity fundraiser in the Last Year: Never true  . Ran Out of Food in the Last Year: Never true  Transportation Needs: No Transportation Needs  . Lack of Transportation (Medical): No  . Lack of Transportation (Non-Medical): No  Physical Activity: Insufficiently Active  . Days of Exercise per Week: 3 days  . Minutes of Exercise per Session: 30 min  Stress: Stress Concern Present  . Feeling of Stress : To some extent  Social Connections:   . Frequency of Communication with Friends and Family:   . Frequency of Social Gatherings with Friends and Family:   . Attends Religious Services:   . Active Member of Clubs or Organizations:   . Attends Archivist Meetings:   Marland Kitchen Marital Status:   Intimate Partner Violence: Not At Risk  . Fear of Current or Ex-Partner: No  . Emotionally Abused: No  . Physically Abused: No  . Sexually Abused: No    FAMILY HISTORY: Family History  Problem Relation Age of Onset  . Hypertension Mother   . Mental illness Mother   . Heart disease Maternal Grandmother   . Diabetes Maternal Grandmother   . Heart disease Maternal Grandfather   . Dementia Maternal Grandfather   . Mental illness Brother     ALLERGIES:  is allergic to penicillins.  MEDICATIONS:  Current Outpatient Medications  Medication Sig Dispense Refill  . albuterol (PROVENTIL) (2.5 MG/3ML) 0.083% nebulizer solution Use via neb q 4 hours prn wheezing 50 vial 2  . simvastatin (ZOCOR) 40 MG tablet TAKE 1 TABLET BY MOUTH EVERY DAY FOR CHOLESTEROL 90 tablet 1  . SYMBICORT 80-4.5 MCG/ACT inhaler TAKE 2 PUFFS BY MOUTH TWICE A DAY (Patient taking differently: Inhale 2 puffs into the lungs as needed. ) 10.2 Inhaler 0   No current facility-administered medications for this visit.    REVIEW OF SYSTEMS:   Constitutional: Denies fevers, chills or abnormal night sweats Eyes: Denies blurriness of vision, double vision or watery eyes Ears, nose,  mouth, throat, and face: Denies mucositis or sore throat Respiratory: Denies cough, dyspnea or wheezes Cardiovascular: Denies palpitation, chest discomfort or lower extremity swelling Gastrointestinal:  Denies nausea, heartburn or change in bowel habits Skin: Denies abnormal skin rashes Lymphatics: Denies new lymphadenopathy or easy bruising Neurological:Denies numbness, tingling or new weaknesses Behavioral/Psych: Mood is stable, no new changes  Breast: Reports palpable lump.  Denies any nipple discharge. All other systems were reviewed with the patient and are negative.  PHYSICAL EXAMINATION: ECOG PERFORMANCE STATUS: 0 - Asymptomatic  Vitals:   09/22/19 1300  BP: 114/60  Pulse: 79  Resp: 16  Temp: 97.7 F (36.5 C)  SpO2: 100%   Filed Weights   09/22/19 1300  Weight: 152 lb 9 oz (69.2 kg)    GENERAL:alert, no distress and comfortable SKIN: skin color, texture, turgor are normal, no rashes or significant  lesions EYES: normal, conjunctiva are pink and non-injected, sclera clear OROPHARYNX:no exudate, no erythema and lips, buccal mucosa, and tongue normal  NECK: supple, thyroid normal size, non-tender, without nodularity LYMPH:  no palpable lymphadenopathy in the cervical, axillary or inguinal LUNGS: clear to auscultation and percussion with normal breathing effort HEART: regular rate & rhythm and no murmurs and no lower extremity edema ABDOMEN:abdomen soft, non-tender and normal bowel sounds Musculoskeletal:no cyanosis of digits and no clubbing  PSYCH: alert & oriented x 3 with fluent speech NEURO: no focal motor/sensory deficits BREAST: Vague palpable mass at around 1 o'clock position of the left breast.  No palpable axillary adenopathy.  (exam performed in the presence of a chaperone)   LABORATORY DATA:  I have reviewed the data as listed Lab Results  Component Value Date   WBC 7.4 03/07/2015   HGB 13.1 03/07/2015   HCT 39.2 03/07/2015   MCV 87.3 03/07/2015   PLT  286 03/07/2015   Lab Results  Component Value Date   NA 140 08/23/2018   K 4.9 08/23/2018   CL 102 08/23/2018   CO2 23 08/23/2018    RADIOGRAPHIC STUDIES: I have personally reviewed the radiological reports and agreed with the findings in the report.  ASSESSMENT AND PLAN:  Malignant neoplasm of upper-outer quadrant of left female breast ([place]) 1.  Infiltrating lobular left breast cancer, grade 2, ER/PR positive, HER-2 negative by FISH: -She noted a palpable thickening in the left breast. -Mammogram on 08/23/2019 shows 1.4 cm mass in the upper left breast.  Single abnormal left axillary lymph node. -Biopsy of the left breast 12 o'clock position shows grade 2 invasive mammary carcinoma.  Biopsy of the left axillary lymph node was positive for metastatic cancer.  ER was 100%, PR 70%, Ki-67 2%.  HER-2 was 2+ by IHC.  FISH was negative.  E-cadherin was negative consistent with lobular phenotype. -MRI of the breast with and without contrast on 09/20/2019 shows 2.2 cm mass in the upper outer left breast consistent with biopsy-proven site of malignancy.  1.5 cm focus of suspicious non-mass enhancement located 3 cm inferior and medial to the primary lesion.  MRI guided biopsy was recommended.  Single morphologically abnormal left axillary lymph node consistent with biopsy-proven metastatic disease.  No suspicious MRI findings on the right. -She will be scheduled for MR guided biopsy. -I talked to her about findings on the biopsy.  She was recommended to proceed with MR guided biopsy and surgical resection. -We will see her back 4 weeks after surgery to discuss further options.  I have also talked to her about the utility of Oncotype DX testing to see if she benefits from adjuvant chemotherapy.  She is agreeable to this option.  I will plan to send the test on surgical resection specimen.  2.  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   All questions were answered. The patient knows to call the clinic with any problems, questions or concerns.    Derek Jack, MD 09/22/19

## 2019-09-22 NOTE — Patient Instructions (Signed)
[place] at [facility] Discharge Instructions  You were seen today by Dr. Delton Coombes. He went over your history, family history and how you've been feeling lately. Based on the recent pathology report, the type of cancer that you have is called invasive lobular carcinoma.  It is ER/PR positive and HER2 negative.  The Ki67 is low.  This is a test that shows how aggressive your cancer is.  Because this is low, that means that it is a slow growing cancer.  He talked with you about the MRI and it showed an additional small area of concern near the known spot found on mammogram.  It is recommended that you get that area biopsied.  If the additional area is not anything to worry about, you can proceed with surgery.  We will see you back 4 weeks after surgery.    Dr. Delton Coombes talked with you about the Oncotype testing that tests for 21 different genes that will show [place] if chemotherapy will be effective.  He discussed with you that based on your type of cancer 60% of the time the test will come back and show that chemo is not needed, 25% will come back and show that it is intermediate risk and you would benefit from chemo but 5% will come back and show that chemo is highly recommended.   He talked with you about your stage cancer and based on the size and the number of lymphnodes involved, you have a stage 2 breast cancer.    He talked with you that if you do not need chemotherapy (based on the Oncotype testing), you will only need radiation after surgery and you will take a pill to prevent the cancer from coming back.  He does not feel that you need genetic testing because you don't have a strong familiar history of cancers.      Thank you for choosing [place] at [facility] to provide your oncology and hematology care.  To afford each patient quality time with our provider, please arrive at least 15 minutes before your scheduled appointment time.   If you  have a lab appointment with the [place] please come in thru the  Main Entrance and check in at the main information desk  You need to re-schedule your appointment should you arrive 10 or more minutes late.  We strive to give you quality time with our providers, and arriving late affects you and other patients whose appointments are after yours.  Also, if you no show three or more times for appointments you may be dismissed from the clinic at the providers discretion.     Again, thank you for choosing [facility].  Our hope is that these requests will decrease the amount of time that you wait before being seen by our physicians.       _____________________________________________________________  Should you have questions after your visit to [facility], please contact our office at [phone] between the hours of 8:00 a.m. and 4:30 p.m.  Voicemails left after 4:00 p.m. will not be returned until the following business day.  For prescription refill requests, have your pharmacy contact our office and allow 72 hours.    Cancer Center Support Programs:   > Cancer Support Group  2nd Tuesday of the month 1pm-2pm, Journey Room

## 2019-09-22 NOTE — Assessment & Plan Note (Addendum)
1.  Infiltrating lobular left breast cancer, grade 2, ER/PR positive, HER-2 negative by FISH: -She noted a palpable thickening in the left breast. -Mammogram on 08/23/2019 shows 1.4 cm mass in the upper left breast.  Single abnormal left axillary lymph node. -Biopsy of the left breast 12 o'clock position shows grade 2 invasive mammary carcinoma.  Biopsy of the left axillary lymph node was positive for metastatic cancer.  ER was 100%, PR 70%, Ki-67 2%.  HER-2 was 2+ by IHC.  FISH was negative.  E-cadherin was negative consistent with lobular phenotype. -MRI of the breast with and without contrast on 09/20/2019 shows 2.2 cm mass in the upper outer left breast consistent with biopsy-proven site of malignancy.  1.5 cm focus of suspicious non-mass enhancement located 3 cm inferior and medial to the primary lesion.  MRI guided biopsy was recommended.  Single morphologically abnormal left axillary lymph node consistent with biopsy-proven metastatic disease.  No suspicious MRI findings on the right. -She will be scheduled for MR guided biopsy. -I talked to her about findings on the biopsy.  She was recommended to proceed with MR guided biopsy and surgical resection. -We will see her back 4 weeks after surgery to discuss further options.  I have also talked to her about the utility of Oncotype DX testing to see if she benefits from adjuvant chemotherapy.  She is agreeable to this option.  I will plan to send the test on surgical resection specimen.  2.  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

## 2019-09-23 ENCOUNTER — Other Ambulatory Visit: Payer: Self-pay | Admitting: General Surgery

## 2019-09-23 DIAGNOSIS — R9389 Abnormal findings on diagnostic imaging of other specified body structures: Secondary | ICD-10-CM

## 2019-09-26 ENCOUNTER — Other Ambulatory Visit (HOSPITAL_COMMUNITY): Payer: Commercial Managed Care - PPO

## 2019-10-04 ENCOUNTER — Ambulatory Visit
Admission: RE | Admit: 2019-10-04 | Discharge: 2019-10-04 | Disposition: A | Discharge status: Home / Self Care | Payer: Commercial Managed Care - PPO | Source: Ambulatory Visit | Attending: General Surgery | Admitting: General Surgery

## 2019-10-04 ENCOUNTER — Other Ambulatory Visit (HOSPITAL_COMMUNITY): Payer: Self-pay | Admitting: Diagnostic Radiology

## 2019-10-04 ENCOUNTER — Other Ambulatory Visit: Payer: Self-pay

## 2019-10-04 DIAGNOSIS — R9389 Abnormal findings on diagnostic imaging of other specified body structures: Secondary | ICD-10-CM

## 2019-10-04 MED ORDER — GADOBUTROL 1 MMOL/ML IV SOLN
7.0000 mL | Freq: Once | INTRAVENOUS | Status: AC | PRN
  Administered 2019-10-04: 7 mL via INTRAVENOUS

## 2019-10-05 ENCOUNTER — Other Ambulatory Visit: Payer: Self-pay | Admitting: General Surgery

## 2019-10-05 DIAGNOSIS — N6321 Unspecified lump in the left breast, upper outer quadrant: Secondary | ICD-10-CM

## 2019-10-13 DIAGNOSIS — C50919 Malignant neoplasm of unspecified site of unspecified female breast: Secondary | ICD-10-CM

## 2019-10-13 HISTORY — DX: Malignant neoplasm of unspecified site of unspecified female breast: C50.919

## 2019-10-17 ENCOUNTER — Other Ambulatory Visit: Payer: Self-pay | Admitting: General Surgery

## 2019-10-17 DIAGNOSIS — Z170 Estrogen receptor positive status [ER+]: Secondary | ICD-10-CM

## 2019-10-17 DIAGNOSIS — C50412 Malignant neoplasm of upper-outer quadrant of left female breast: Secondary | ICD-10-CM

## 2019-10-17 DIAGNOSIS — N6321 Unspecified lump in the left breast, upper outer quadrant: Secondary | ICD-10-CM

## 2019-10-21 ENCOUNTER — Encounter (HOSPITAL_BASED_OUTPATIENT_CLINIC_OR_DEPARTMENT_OTHER): Payer: Self-pay | Admitting: General Surgery

## 2019-10-21 ENCOUNTER — Other Ambulatory Visit: Payer: Self-pay

## 2019-10-26 MED ORDER — ENSURE PRE-SURGERY PO LIQD: 296.0000 mL | Freq: Once | ORAL | Status: DC

## 2019-10-26 NOTE — Progress Notes (Signed)

## 2019-10-27 ENCOUNTER — Other Ambulatory Visit: Payer: Self-pay

## 2019-10-27 ENCOUNTER — Other Ambulatory Visit (HOSPITAL_COMMUNITY): Payer: Commercial Managed Care - PPO

## 2019-10-27 ENCOUNTER — Other Ambulatory Visit (HOSPITAL_COMMUNITY)
Admission: RE | Admit: 2019-10-27 | Discharge: 2019-10-27 | Disposition: A | Discharge status: Home / Self Care | Payer: Commercial Managed Care - PPO | Source: Ambulatory Visit | Attending: General Surgery | Admitting: General Surgery

## 2019-10-27 ENCOUNTER — Ambulatory Visit (HOSPITAL_COMMUNITY): Payer: Commercial Managed Care - PPO | Admitting: Hematology

## 2019-10-27 DIAGNOSIS — Z20822 Contact with and (suspected) exposure to covid-19: Secondary | ICD-10-CM | POA: Insufficient documentation

## 2019-10-27 DIAGNOSIS — Z01812 Encounter for preprocedural laboratory examination: Secondary | ICD-10-CM | POA: Diagnosis not present

## 2019-10-28 LAB — SARS CORONAVIRUS 2 (TAT 6-24 HRS): SARS Coronavirus 2: NEGATIVE

## 2019-10-31 ENCOUNTER — Ambulatory Visit
Admission: RE | Admit: 2019-10-31 | Discharge: 2019-10-31 | Disposition: A | Discharge status: Home / Self Care | Payer: Commercial Managed Care - PPO | Source: Ambulatory Visit | Attending: General Surgery | Admitting: General Surgery

## 2019-10-31 ENCOUNTER — Ambulatory Visit (HOSPITAL_COMMUNITY): Payer: Commercial Managed Care - PPO

## 2019-10-31 ENCOUNTER — Ambulatory Visit (HOSPITAL_COMMUNITY)
Admission: RE | Admit: 2019-10-31 | Discharge: 2019-10-31 | Disposition: A | Discharge status: Home / Self Care | Payer: Commercial Managed Care - PPO | Attending: General Surgery | Admitting: General Surgery

## 2019-10-31 ENCOUNTER — Other Ambulatory Visit: Payer: Self-pay | Admitting: General Surgery

## 2019-10-31 ENCOUNTER — Encounter (HOSPITAL_BASED_OUTPATIENT_CLINIC_OR_DEPARTMENT_OTHER): Payer: Self-pay | Admitting: General Surgery

## 2019-10-31 ENCOUNTER — Ambulatory Visit (HOSPITAL_BASED_OUTPATIENT_CLINIC_OR_DEPARTMENT_OTHER): Payer: Commercial Managed Care - PPO | Admitting: Anesthesiology

## 2019-10-31 ENCOUNTER — Encounter (HOSPITAL_BASED_OUTPATIENT_CLINIC_OR_DEPARTMENT_OTHER)
Admission: RE | Disposition: A | Discharge status: Home / Self Care | Payer: Self-pay | Source: Home / Self Care | Attending: General Surgery

## 2019-10-31 ENCOUNTER — Other Ambulatory Visit: Payer: Self-pay

## 2019-10-31 ENCOUNTER — Ambulatory Visit (HOSPITAL_COMMUNITY)
Admission: RE | Admit: 2019-10-31 | Discharge: 2019-10-31 | Disposition: A | Discharge status: Home / Self Care | Payer: Commercial Managed Care - PPO | Source: Ambulatory Visit | Attending: General Surgery | Admitting: General Surgery

## 2019-10-31 DIAGNOSIS — C50412 Malignant neoplasm of upper-outer quadrant of left female breast: Secondary | ICD-10-CM

## 2019-10-31 DIAGNOSIS — F17200 Nicotine dependence, unspecified, uncomplicated: Secondary | ICD-10-CM | POA: Diagnosis not present

## 2019-10-31 DIAGNOSIS — N6321 Unspecified lump in the left breast, upper outer quadrant: Secondary | ICD-10-CM

## 2019-10-31 DIAGNOSIS — E785 Hyperlipidemia, unspecified: Secondary | ICD-10-CM | POA: Insufficient documentation

## 2019-10-31 DIAGNOSIS — J45909 Unspecified asthma, uncomplicated: Secondary | ICD-10-CM | POA: Diagnosis not present

## 2019-10-31 DIAGNOSIS — Z9049 Acquired absence of other specified parts of digestive tract: Secondary | ICD-10-CM | POA: Insufficient documentation

## 2019-10-31 DIAGNOSIS — K219 Gastro-esophageal reflux disease without esophagitis: Secondary | ICD-10-CM | POA: Insufficient documentation

## 2019-10-31 DIAGNOSIS — Z79899 Other long term (current) drug therapy: Secondary | ICD-10-CM | POA: Insufficient documentation

## 2019-10-31 DIAGNOSIS — C50912 Malignant neoplasm of unspecified site of left female breast: Secondary | ICD-10-CM | POA: Diagnosis present

## 2019-10-31 DIAGNOSIS — E7800 Pure hypercholesterolemia, unspecified: Secondary | ICD-10-CM | POA: Insufficient documentation

## 2019-10-31 DIAGNOSIS — Z170 Estrogen receptor positive status [ER+]: Secondary | ICD-10-CM | POA: Insufficient documentation

## 2019-10-31 DIAGNOSIS — J449 Chronic obstructive pulmonary disease, unspecified: Secondary | ICD-10-CM | POA: Insufficient documentation

## 2019-10-31 DIAGNOSIS — Z818 Family history of other mental and behavioral disorders: Secondary | ICD-10-CM | POA: Diagnosis not present

## 2019-10-31 DIAGNOSIS — K589 Irritable bowel syndrome without diarrhea: Secondary | ICD-10-CM | POA: Diagnosis not present

## 2019-10-31 DIAGNOSIS — Z880 Allergy status to penicillin: Secondary | ICD-10-CM | POA: Insufficient documentation

## 2019-10-31 DIAGNOSIS — C773 Secondary and unspecified malignant neoplasm of axilla and upper limb lymph nodes: Secondary | ICD-10-CM | POA: Insufficient documentation

## 2019-10-31 HISTORY — PX: BREAST LUMPECTOMY WITH RADIOACTIVE SEED AND SENTINEL LYMPH NODE BIOPSY: SHX6550

## 2019-10-31 SURGERY — BREAST LUMPECTOMY WITH RADIOACTIVE SEED AND SENTINEL LYMPH NODE BIOPSY
Anesthesia: Regional | Site: Breast | Laterality: Left

## 2019-10-31 MED ORDER — LACTATED RINGERS IV SOLN: INTRAVENOUS | Status: DC

## 2019-10-31 MED ORDER — DEXMEDETOMIDINE HCL 200 MCG/2ML IV SOLN
INTRAVENOUS | Status: DC | PRN
  Administered 2019-10-31 (×5): 4 ug via INTRAVENOUS

## 2019-10-31 MED ORDER — ACETAMINOPHEN 500 MG PO TABS
ORAL_TABLET | ORAL | Status: AC
  Filled 2019-10-31: qty 2

## 2019-10-31 MED ORDER — HYDROMORPHONE HCL 1 MG/ML IJ SOLN: 0.2500 mg | INTRAMUSCULAR | Status: DC | PRN

## 2019-10-31 MED ORDER — ACETAMINOPHEN 500 MG PO TABS
1000.0000 mg | ORAL_TABLET | ORAL | Status: AC
  Administered 2019-10-31: 1000 mg via ORAL

## 2019-10-31 MED ORDER — DEXAMETHASONE SODIUM PHOSPHATE 4 MG/ML IJ SOLN
INTRAMUSCULAR | Status: DC | PRN
  Administered 2019-10-31: 5 mg via INTRAVENOUS

## 2019-10-31 MED ORDER — DEXMEDETOMIDINE HCL IN NACL 200 MCG/50ML IV SOLN
INTRAVENOUS | Status: AC
  Filled 2019-10-31: qty 50

## 2019-10-31 MED ORDER — SODIUM CHLORIDE (PF) 0.9 % IJ SOLN
INTRAMUSCULAR | Status: AC
  Filled 2019-10-31: qty 10

## 2019-10-31 MED ORDER — CIPROFLOXACIN IN D5W 400 MG/200ML IV SOLN
400.0000 mg | INTRAVENOUS | Status: AC
  Administered 2019-10-31: 400 mg via INTRAVENOUS

## 2019-10-31 MED ORDER — ACETAMINOPHEN 500 MG PO TABS: 1000.0000 mg | ORAL_TABLET | Freq: Once | ORAL | Status: DC

## 2019-10-31 MED ORDER — EPHEDRINE SULFATE 50 MG/ML IJ SOLN
INTRAMUSCULAR | Status: DC | PRN
  Administered 2019-10-31: 10 mg via INTRAVENOUS
  Administered 2019-10-31: 5 mg via INTRAVENOUS
  Administered 2019-10-31: 10 mg via INTRAVENOUS
  Administered 2019-10-31: 5 mg via INTRAVENOUS

## 2019-10-31 MED ORDER — SCOPOLAMINE 1 MG/3DAYS TD PT72
1.0000 | MEDICATED_PATCH | TRANSDERMAL | Status: DC
  Administered 2019-10-31: 11:00:00 1.5 mg via TRANSDERMAL

## 2019-10-31 MED ORDER — ONDANSETRON HCL 4 MG/2ML IJ SOLN
INTRAMUSCULAR | Status: DC | PRN
  Administered 2019-10-31 (×2): 4 mg via INTRAVENOUS

## 2019-10-31 MED ORDER — GABAPENTIN 100 MG PO CAPS
100.0000 mg | ORAL_CAPSULE | ORAL | Status: AC
  Administered 2019-10-31: 11:00:00 100 mg via ORAL

## 2019-10-31 MED ORDER — PHENYLEPHRINE HCL (PRESSORS) 10 MG/ML IV SOLN
INTRAVENOUS | Status: DC | PRN
  Administered 2019-10-31 (×2): 40 ug via INTRAVENOUS
  Administered 2019-10-31: 120 ug via INTRAVENOUS
  Administered 2019-10-31: 80 ug via INTRAVENOUS
  Administered 2019-10-31: 40 ug via INTRAVENOUS
  Administered 2019-10-31: 120 ug via INTRAVENOUS
  Administered 2019-10-31: 40 ug via INTRAVENOUS
  Administered 2019-10-31 (×2): 80 ug via INTRAVENOUS
  Administered 2019-10-31 (×2): 40 ug via INTRAVENOUS

## 2019-10-31 MED ORDER — FENTANYL CITRATE (PF) 100 MCG/2ML IJ SOLN
50.0000 ug | INTRAMUSCULAR | Status: DC | PRN
  Administered 2019-10-31: 12:00:00 100 ug via INTRAVENOUS

## 2019-10-31 MED ORDER — CIPROFLOXACIN IN D5W 400 MG/200ML IV SOLN
INTRAVENOUS | Status: AC
  Filled 2019-10-31: qty 200

## 2019-10-31 MED ORDER — BUPIVACAINE HCL (PF) 0.25 % IJ SOLN
INTRAMUSCULAR | Status: DC | PRN
  Administered 2019-10-31: 6 mL

## 2019-10-31 MED ORDER — PROMETHAZINE HCL 25 MG/ML IJ SOLN: 6.2500 mg | INTRAMUSCULAR | Status: DC | PRN

## 2019-10-31 MED ORDER — GABAPENTIN 100 MG PO CAPS
ORAL_CAPSULE | ORAL | Status: AC
  Filled 2019-10-31: qty 1

## 2019-10-31 MED ORDER — PROPOFOL 10 MG/ML IV BOLUS
INTRAVENOUS | Status: DC | PRN
  Administered 2019-10-31: 200 mg via INTRAVENOUS

## 2019-10-31 MED ORDER — BUPIVACAINE LIPOSOME 1.3 % IJ SUSP
INTRAMUSCULAR | Status: DC | PRN
  Administered 2019-10-31: 10 mL via PERINEURAL

## 2019-10-31 MED ORDER — FENTANYL CITRATE (PF) 100 MCG/2ML IJ SOLN
INTRAMUSCULAR | Status: AC
  Filled 2019-10-31: qty 2

## 2019-10-31 MED ORDER — OXYCODONE HCL 5 MG PO TABS: 5.0000 mg | ORAL_TABLET | Freq: Four times a day (QID) | ORAL | 0 refills | Status: DC | PRN

## 2019-10-31 MED ORDER — TECHNETIUM TC 99M SULFUR COLLOID FILTERED
1.0000 | Freq: Once | INTRAVENOUS | Status: AC | PRN
  Administered 2019-10-31: 1 via INTRADERMAL

## 2019-10-31 MED ORDER — METHYLENE BLUE 0.5 % INJ SOLN
INTRAVENOUS | Status: AC
  Filled 2019-10-31: qty 10

## 2019-10-31 MED ORDER — KETOROLAC TROMETHAMINE 15 MG/ML IJ SOLN
INTRAMUSCULAR | Status: AC
  Filled 2019-10-31: qty 1

## 2019-10-31 MED ORDER — SCOPOLAMINE 1 MG/3DAYS TD PT72
MEDICATED_PATCH | TRANSDERMAL | Status: AC
  Filled 2019-10-31: qty 1

## 2019-10-31 MED ORDER — ONDANSETRON HCL 4 MG/2ML IJ SOLN
INTRAMUSCULAR | Status: AC
  Filled 2019-10-31: qty 2

## 2019-10-31 MED ORDER — KETOROLAC TROMETHAMINE 15 MG/ML IJ SOLN
15.0000 mg | INTRAMUSCULAR | Status: AC
  Administered 2019-10-31: 15 mg via INTRAVENOUS

## 2019-10-31 MED ORDER — OXYCODONE HCL 5 MG PO TABS: 5.0000 mg | ORAL_TABLET | Freq: Once | ORAL | Status: DC | PRN

## 2019-10-31 MED ORDER — OXYCODONE HCL 5 MG/5ML PO SOLN: 5.0000 mg | Freq: Once | ORAL | Status: DC | PRN

## 2019-10-31 MED ORDER — LIDOCAINE HCL (CARDIAC) PF 100 MG/5ML IV SOSY
PREFILLED_SYRINGE | INTRAVENOUS | Status: DC | PRN
  Administered 2019-10-31: 60 mg via INTRAVENOUS

## 2019-10-31 MED ORDER — MIDAZOLAM HCL 2 MG/2ML IJ SOLN
1.0000 mg | INTRAMUSCULAR | Status: DC | PRN
  Administered 2019-10-31: 1 mg via INTRAVENOUS

## 2019-10-31 MED ORDER — KETOROLAC TROMETHAMINE 30 MG/ML IJ SOLN: 30.0000 mg | Freq: Once | INTRAMUSCULAR | Status: DC | PRN

## 2019-10-31 MED ORDER — ROPIVACAINE HCL 5 MG/ML IJ SOLN
INTRAMUSCULAR | Status: DC | PRN
  Administered 2019-10-31: 20 mL via PERINEURAL

## 2019-10-31 MED ORDER — PHENYLEPHRINE 40 MCG/ML (10ML) SYRINGE FOR IV PUSH (FOR BLOOD PRESSURE SUPPORT)
PREFILLED_SYRINGE | INTRAVENOUS | Status: AC
  Filled 2019-10-31: qty 10

## 2019-10-31 MED ORDER — MIDAZOLAM HCL 2 MG/2ML IJ SOLN
INTRAMUSCULAR | Status: AC
  Filled 2019-10-31: qty 2

## 2019-10-31 MED ORDER — CIPROFLOXACIN IN D5W 400 MG/200ML IV SOLN: 400.0000 mg | INTRAVENOUS | Status: DC

## 2019-10-31 MED ORDER — MEPERIDINE HCL 25 MG/ML IJ SOLN: 6.2500 mg | INTRAMUSCULAR | Status: DC | PRN

## 2019-10-31 SURGICAL SUPPLY — 61 items
ADH SKN CLS APL DERMABOND .7 (GAUZE/BANDAGES/DRESSINGS) ×1
APL PRP STRL LF DISP 70% ISPRP (MISCELLANEOUS) ×1
APPLIER CLIP 9.375 MED OPEN (MISCELLANEOUS) ×3
APR CLP MED 9.3 20 MLT OPN (MISCELLANEOUS) ×1
BINDER BREAST LRG (GAUZE/BANDAGES/DRESSINGS) ×2 IMPLANT
BINDER BREAST MEDIUM (GAUZE/BANDAGES/DRESSINGS) IMPLANT
BINDER BREAST XLRG (GAUZE/BANDAGES/DRESSINGS) IMPLANT
BINDER BREAST XXLRG (GAUZE/BANDAGES/DRESSINGS) IMPLANT
BLADE SURG 15 STRL LF DISP TIS (BLADE) ×1 IMPLANT
BLADE SURG 15 STRL SS (BLADE) ×3
CANISTER SUC SOCK COL 7IN (MISCELLANEOUS) IMPLANT
CANISTER SUCT 1200ML W/VALVE (MISCELLANEOUS) IMPLANT
CHLORAPREP W/TINT 26 (MISCELLANEOUS) ×3 IMPLANT
CLIP APPLIE 9.375 MED OPEN (MISCELLANEOUS) IMPLANT
CLIP VESOCCLUDE SM WIDE 6/CT (CLIP) ×3 IMPLANT
CLOSURE WOUND 1/2 X4 (GAUZE/BANDAGES/DRESSINGS) ×1
COVER BACK TABLE 60X90IN (DRAPES) ×3 IMPLANT
COVER MAYO STAND STRL (DRAPES) ×3 IMPLANT
COVER PROBE W GEL 5X96 (DRAPES) ×3 IMPLANT
COVER WAND RF STERILE (DRAPES) IMPLANT
DECANTER SPIKE VIAL GLASS SM (MISCELLANEOUS) IMPLANT
DERMABOND ADVANCED (GAUZE/BANDAGES/DRESSINGS) ×2
DERMABOND ADVANCED .7 DNX12 (GAUZE/BANDAGES/DRESSINGS) ×1 IMPLANT
DRAPE LAPAROSCOPIC ABDOMINAL (DRAPES) ×3 IMPLANT
DRAPE UTILITY XL STRL (DRAPES) ×3 IMPLANT
ELECT COATED BLADE 2.86 ST (ELECTRODE) ×3 IMPLANT
ELECT REM PT RETURN 9FT ADLT (ELECTROSURGICAL) ×3
ELECTRODE REM PT RTRN 9FT ADLT (ELECTROSURGICAL) ×1 IMPLANT
GLOVE BIO SURGEON STRL SZ7 (GLOVE) ×6 IMPLANT
GLOVE BIOGEL PI IND STRL 7.5 (GLOVE) ×1 IMPLANT
GLOVE BIOGEL PI INDICATOR 7.5 (GLOVE) ×2
GOWN STRL REUS W/ TWL LRG LVL3 (GOWN DISPOSABLE) ×2 IMPLANT
GOWN STRL REUS W/TWL LRG LVL3 (GOWN DISPOSABLE) ×9
HEMOSTAT ARISTA ABSORB 3G PWDR (HEMOSTASIS) IMPLANT
KIT MARKER MARGIN INK (KITS) ×3 IMPLANT
NDL HYPO 25X1 1.5 SAFETY (NEEDLE) ×1 IMPLANT
NDL SAFETY ECLIPSE 18X1.5 (NEEDLE) IMPLANT
NEEDLE HYPO 18GX1.5 SHARP (NEEDLE)
NEEDLE HYPO 25X1 1.5 SAFETY (NEEDLE) ×3 IMPLANT
NS IRRIG 1000ML POUR BTL (IV SOLUTION) ×2 IMPLANT
PACK BASIN DAY SURGERY FS (CUSTOM PROCEDURE TRAY) ×3 IMPLANT
PENCIL SMOKE EVACUATOR (MISCELLANEOUS) ×3 IMPLANT
RETRACTOR ONETRAX LX 90X20 (MISCELLANEOUS) ×2 IMPLANT
SLEEVE SCD COMPRESS KNEE MED (MISCELLANEOUS) ×3 IMPLANT
SPONGE LAP 4X18 RFD (DISPOSABLE) ×3 IMPLANT
STRIP CLOSURE SKIN 1/2X4 (GAUZE/BANDAGES/DRESSINGS) ×2 IMPLANT
SUT ETHILON 2 0 FS 18 (SUTURE) IMPLANT
SUT MNCRL AB 4-0 PS2 18 (SUTURE) ×3 IMPLANT
SUT MON AB 5-0 PS2 18 (SUTURE) ×2 IMPLANT
SUT SILK 2 0 SH (SUTURE) ×4 IMPLANT
SUT VIC AB 2-0 SH 27 (SUTURE) ×12
SUT VIC AB 2-0 SH 27XBRD (SUTURE) ×1 IMPLANT
SUT VIC AB 3-0 SH 27 (SUTURE) ×9
SUT VIC AB 3-0 SH 27X BRD (SUTURE) ×1 IMPLANT
SUT VIC AB 5-0 PS2 18 (SUTURE) IMPLANT
SYR CONTROL 10ML LL (SYRINGE) ×3 IMPLANT
TOWEL GREEN STERILE FF (TOWEL DISPOSABLE) ×3 IMPLANT
TRAY FAXITRON CT DISP (TRAY / TRAY PROCEDURE) ×3 IMPLANT
TUBE CONNECTING 20'X1/4 (TUBING) ×1
TUBE CONNECTING 20X1/4 (TUBING) ×1 IMPLANT
YANKAUER SUCT BULB TIP NO VENT (SUCTIONS) ×2 IMPLANT

## 2019-10-31 NOTE — Progress Notes (Signed)
Assisted Dr. Doroteo Glassman with left, ultrasound guided, pectoralis block. Side rails up, monitors on throughout procedure. See vital signs in flow sheet. Tolerated Procedure well.

## 2019-10-31 NOTE — Anesthesia Procedure Notes (Signed)
Procedure Name: LMA Insertion Date/Time: 10/31/2019 12:39 PM Performed by: Gwyndolyn Saxon, CRNA Pre-anesthesia Checklist: Patient identified, Emergency Drugs available, Suction available and Patient being monitored Patient Re-evaluated:Patient Re-evaluated prior to induction Oxygen Delivery Method: Circle system utilized Preoxygenation: Pre-oxygenation with 100% oxygen Induction Type: IV induction Ventilation: Mask ventilation without difficulty LMA: LMA inserted LMA Size: 4.0 Number of attempts: 1 Placement Confirmation: positive ETCO2 and breath sounds checked- equal and bilateral Tube secured with: Tape Dental Injury: Teeth and Oropharynx as per pre-operative assessment

## 2019-10-31 NOTE — Discharge Instructions (Signed)
[facility] Office Phone Number [phone]  BREAST BIOPSY/ PARTIAL MASTECTOMY: POST OP INSTRUCTIONS Take 400 mg of ibuprofen every 8 hours or 650 mg tylenol every 6 hours for next 72 hours then as needed. Use ice several times daily also. Always review your discharge instruction sheet given to you by the facility where your surgery was performed.  IF YOU HAVE DISABILITY OR FAMILY LEAVE FORMS, YOU MUST BRING THEM TO THE OFFICE FOR PROCESSING.  DO NOT GIVE THEM TO YOUR DOCTOR.  1. A prescription for pain medication may be given to you upon discharge.  Take your pain medication as prescribed, if needed.  If narcotic pain medicine is not needed, then you may take acetaminophen (Tylenol), naprosyn (Alleve) or ibuprofen (Advil) as needed. 2. Take your usually prescribed medications unless otherwise directed 3. If you need a refill on your pain medication, please contact your pharmacy.  They will contact our office to request authorization.  Prescriptions will not be filled after 5pm or on week-ends. 4. You should eat very light the first 24 hours after surgery, such as soup, crackers, pudding, etc.  Resume your normal diet the day after surgery. 5. Most patients will experience some swelling and bruising in the breast.  Ice packs and a good support bra will help.  Wear the breast binder provided or a sports bra for 72 hours day and night.  After that wear a sports bra during the day until you return to the office. Swelling and bruising can take several days to resolve.  6. It is common to experience some constipation if taking pain medication after surgery.  Increasing fluid intake and taking a stool softener will usually help or prevent this problem from occurring.  A mild laxative (Milk of Magnesia or Miralax) should be taken according to package directions if there are no bowel movements after 48 hours. 7. Unless discharge instructions indicate otherwise, you may remove your bandages 48  hours after surgery and you may shower at that time.  You may have steri-strips (small skin tapes) in place directly over the incision.  These strips should be left on the skin for 7-10 days and will come off on their own.  If your surgeon used skin glue on the incision, you may shower in 24 hours.  The glue will flake off over the next 2-3 weeks.  Any sutures or staples will be removed at the office during your follow-up visit. 8. ACTIVITIES:  You may resume regular daily activities (gradually increasing) beginning the next day.  Wearing a good support bra or sports bra minimizes pain and swelling.  You may have sexual intercourse when it is comfortable. a. You may drive when you no longer are taking prescription pain medication, you can comfortably wear a seatbelt, and you can safely maneuver your car and apply brakes. b. RETURN TO WORK:  ______________________________________________________________________________________ 9. You should see your doctor in the office for a follow-up appointment approximately two weeks after your surgery.  Your doctor's nurse will typically make your follow-up appointment when she calls you with your pathology report.  Expect your pathology report 3-4 business days after your surgery.  You may call to check if you do not hear from us after three days. 10. OTHER INSTRUCTIONS: _______________________________________________________________________________________________ _____________________________________________________________________________________________________________________________________ _____________________________________________________________________________________________________________________________________ _____________________________________________________________________________________________________________________________________  WHEN TO CALL DR WAKEFIELD: 1. Fever over 101.0 2. Nausea and/or vomiting. 3. Extreme swelling or  bruising. 4. Continued bleeding from incision. 5. Increased pain, redness, or drainage from the incision.  The clinic   staff is available to answer your questions during regular business hours.  Please don't hesitate to call and ask to speak to one of the nurses for clinical concerns.  If you have a medical emergency, go to the nearest emergency room or call 911.  A surgeon from [facility] is always on call at the hospital.  For further questions, please visit [email or web address]   Regional Anesthesia Blocks  1. Numbness or the inability to move the "blocked" extremity may last from 3-48 hours after placement. The length of time depends on the medication injected and your individual response to the medication. If the numbness is not going away after 48 hours, call your surgeon.  2. The extremity that is blocked will need to be protected until the numbness is gone and the  Strength has returned. Because you cannot feel it, you will need to take extra care to avoid injury. Because it may be weak, you may have difficulty moving it or using it. You may not know what position it is in without looking at it while the block is in effect.  3. For blocks in the legs and feet, returning to weight bearing and walking needs to be done carefully. You will need to wait until the numbness is entirely gone and the strength has returned. You should be able to move your leg and foot normally before you try and bear weight or walk. You will need someone to be with you when you first try to ensure you do not fall and possibly risk injury.  4. Bruising and tenderness at the needle site are common side effects and will resolve in a few days.  5. Persistent numbness or new problems with movement should be communicated to the surgeon or the [place] [phone] [place] [phone]).  Post Anesthesia Home Care Instructions  Next dose of Tylenol or Ibuprofen  can be taken at 5pm today.   Activity: Get plenty of rest for the remainder of the day. A responsible individual must stay with you for 24 hours following the procedure.  For the next 24 hours, DO NOT: -Drive a car -Paediatric nurse -Drink alcoholic beverages -Take any medication unless instructed by your physician -Make any legal decisions or sign important papers.  Meals: Start with liquid foods such as gelatin or soup. Progress to regular foods as tolerated. Avoid greasy, spicy, heavy foods. If nausea and/or vomiting occur, drink only clear liquids until the nausea and/or vomiting subsides. Call your physician if vomiting continues.  Special Instructions/Symptoms: Your throat may feel dry or sore from the anesthesia or the breathing tube placed in your throat during surgery. If this causes discomfort, gargle with warm salt water. The discomfort should disappear within 24 hours.  If you had a scopolamine patch placed behind your ear for the management of post- operative nausea and/or vomiting:  1. The medication in the patch is effective for 72 hours, after which it should be removed.  Wrap patch in a tissue and discard in the trash. Wash hands thoroughly with soap and water. 2. You may remove the patch earlier than 72 hours if you experience unpleasant side effects which may include dry mouth, dizziness or visual disturbances. 3. Avoid touching the patch. Wash your hands with soap and water after contact with the patch.    Information for Discharge Teaching: EXPAREL (bupivacaine liposome injectable suspension)   Your surgeon or anesthesiologist gave you EXPAREL(bupivacaine) to help control your pain after surgery.  EXPAREL is a local anesthetic that provides pain relief by numbing the tissue around the surgical site.  EXPAREL is designed to release pain medication over time and can control pain for up to 72 hours.  Depending on how you respond to EXPAREL, you may require less  pain medication during your recovery.  Possible side effects:  Temporary loss of sensation or ability to move in the area where bupivacaine was injected.  Nausea, vomiting, constipation  Rarely, numbness and tingling in your mouth or lips, lightheadedness, or anxiety may occur.  Call your doctor right away if you think you may be experiencing any of these sensations, or if you have other questions regarding possible side effects.  Follow all other discharge instructions given to you by your surgeon or nurse. Eat a healthy diet and drink plenty of water or other fluids.  If you return to the hospital for any reason within 96 hours following the administration of EXPAREL, it is important for health care providers to know that you have received this anesthetic. A teal colored band has been placed on your arm with the date, time and amount of EXPAREL you have received in order to alert and inform your health care providers. Please leave this armband in place for the full 96 hours following administration, and then you may remove the band.

## 2019-10-31 NOTE — Op Note (Signed)
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  Left breast radioactive seed guided bracketed lumpectomy 2.  Left deep axillary sentinel lymph node biopsy 3.  Left radioactive seed guided node excision Surgeon: Dr. Serita Grammes Anesthesia: General with pectoral block Estimated blood loss: 20 cc Specimens: 1.  Left breast tissue marked with paint containing 2 clips and 2 seeds 2.  Additional left breast superior, lateral, inferior, and posterior margins marked short superior, long lateral, double deep 3.  Left targeted axillary node dissection Complications: None Drains: None Sponge needle count was correct completion Decision to recovery stable condition  Indications:59 year old female who presents with breast cancer. 10 yof referred for new left breast cancer. she noted a sensitive area with thickening in last month in left breast. she has no prior breast history and she has no family history. she has no dc. she had mm that shows b density breasts. there is irregular mass in uoq of left breast. US shows a 1.4x1x0.7 cm mass and a single abnormal ax node with fct of .5cm. biopsy of node is positive. 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 She underwent mri due to [facility] and was found to have the 2.2 cm known cancer and a 1.5 cm nme 3 c m from this.  There is a single abnormal node.  Right was normal.  Biopsy of this second lesion is grade I invasive mammary carcinoma as well.   We discussed options and she would like to proceed with seed bracketed lumpectomy and a TAD.    Procedure: After informed consent was obtained the patient first underwent a pectoral block.  She also an injection of technetium in the standard periareolar fashion.  She was given antibiotics.  SCDs were placed.  She was placed under general anesthesia without complication.  She was prepped and  draped in the standard sterile surgical fashion.  A surgical timeout was then performed.  I first did the lumpectomy.  Identified the location of both seeds.  I then made a periareolar incision after infiltrating with Marcaine in order to hide the scar later.  I then used a lighted retractor to tunnel to the lesion.  I then used cautery to remove both seeds with an attempt to get a clear margin around the entire area.  Mammogram confirmed removal of the clips and the seeds.  Due to the lobular nature of the cancer and after reviewing the 3D pictures of this I did excise additional margins.  The posterior margin is now the muscle.  I then placed clips.  Hemostasis was observed.  I closed this with 2-0 Vicryl.  The skin was closed with 3-0 Vicryl and 5-0 Monocryl.  Glue and Steri-Strips were applied.  I then identified the location of the radioactive seed in the low axilla.  I filtrated Marcaine and made an incision below the axillary hairline.  I then dissected through the axillary fascia.  The seed containing node was visible.  The seed was then a small hematoma right next to the node.  That I remove the seed separately and I suspect this is where the clip ended up as well.  This clearly was the positive node that had been biopsied though.  There were a small packet of sentinel nodes along with this node that was also a sentinel node.  I remove these together.  Mammogram showed that  there was no clip as mentioned previously.  There was no background radioactivity.  These were passed off the table as a specimen left targeted axillary lymph node dissection.  I then obtained hemostasis.  This was closed with 2-0 Vicryl, 3-0 Vicryl, and 4-0 Monocryl.  Glue and Steri-Strips were applied.  She tolerated this well was extubated and transferred to the recovery room in stable condition.

## 2019-10-31 NOTE — Transfer of Care (Signed)
Immediate Anesthesia Transfer of Care Note  Patient: [name]  Procedure(s) Performed: LEFT BREAST LUMPECTOMY X 2 WITH RADIOACTIVE SEED AND SENTINEL LYMPH NODE BIOPSY, LEFT AXILLARY SENTINEL NODE RADIOACTIVE SEED GUIDED EXCISION (Left )  Patient Location: PACU  Anesthesia Type:General and Regional  Level of Consciousness: drowsy and patient cooperative  Airway & Oxygen Therapy: Patient Spontanous Breathing and Patient connected to face mask oxygen  Post-op Assessment: Report given to RN and Post -op Vital signs reviewed and stable  Post vital signs: Reviewed and stable  Last Vitals:  Vitals Value Taken Time  BP 97/59 10/31/19 1353  Temp    Pulse 73 10/31/19 1358  Resp 13 10/31/19 1358  SpO2 98 % 10/31/19 1358  Vitals shown include unvalidated device data.  Last Pain:  Vitals:   10/31/19 1056  TempSrc: Oral  PainSc: 0-No pain         Complications: No apparent anesthesia complications

## 2019-10-31 NOTE — Anesthesia Preprocedure Evaluation (Signed)
Anesthesia Evaluation  Patient identified by MRN, date of birth, ID band Patient awake    Reviewed: Allergy & Precautions, NPO status , Patient's Chart, lab work & pertinent test results  Airway Mallampati: II  TM Distance: >3 FB Neck ROM: Full    Dental no notable dental hx.    Pulmonary COPD,  COPD inhaler, Current Smoker,  20 pack year history    Pulmonary exam normal breath sounds clear to auscultation       Cardiovascular Normal cardiovascular exam Rhythm:Regular Rate:Normal  HLD   Neuro/Psych negative neurological ROS  negative psych ROS   GI/Hepatic Neg liver ROS, GERD  Controlled,IBS   Endo/Other  negative endocrine ROS  Renal/GU Renal diseaseHx RCC  negative genitourinary   Musculoskeletal negative musculoskeletal ROS (+)   Abdominal   Peds negative pediatric ROS (+)  Hematology negative hematology ROS (+)   Anesthesia Other Findings Left breast ca  Reproductive/Obstetrics negative OB ROS                             Anesthesia Physical Anesthesia Plan  ASA: III  Anesthesia Plan: General and Regional   Post-op Pain Management: GA combined w/ Regional for post-op pain   Induction: Intravenous  PONV Risk Score and Plan: 2 and Ondansetron, Dexamethasone, Midazolam, Scopolamine patch - Pre-op and Treatment may vary due to age or medical condition  Airway Management Planned: LMA  Additional Equipment: None  Intra-op Plan:   Post-operative Plan: Extubation in OR  Informed Consent: I have reviewed the patients History and Physical, chart, labs and discussed the procedure including the risks, benefits and alternatives for the proposed anesthesia with the patient or authorized representative who has indicated his/her understanding and acceptance.     Dental advisory given  Plan Discussed with: CRNA  Anesthesia Plan Comments:         Anesthesia Quick  Evaluation

## 2019-10-31 NOTE — Interval H&P Note (Signed)
History and Physical Interval Note:  10/31/2019 12:20 PM  Monica Russell  has presented today for surgery, with the diagnosis of LEFT BREAST CANCER.  The various methods of treatment have been discussed with the patient and family. After consideration of risks, benefits and other options for treatment, the patient has consented to  Procedure(s): LEFT BREAST LUMPECTOMY X 2 WITH RADIOACTIVE SEED AND SENTINEL LYMPH NODE BIOPSY, LEFT AXILLARY SENTINEL NODE RADIOACTIVE SEED GUIDED EXCISION (Left) as a surgical intervention.  The patient's history has been reviewed, patient examined, no change in status, stable for surgery.  I have reviewed the patient's chart and labs.  Questions were answered to the patient's satisfaction.     Rolm Bookbinder

## 2019-10-31 NOTE — Progress Notes (Signed)
Emotional support provided through nuclear medicine breast injections. Vital signs stable. Patient tolerated well.  

## 2019-10-31 NOTE — Anesthesia Postprocedure Evaluation (Signed)
Anesthesia Post Note  Patient: Monica Russell  Procedure(s) Performed: LEFT BREAST LUMPECTOMY X 2 WITH RADIOACTIVE SEED AND SENTINEL LYMPH NODE BIOPSY, LEFT AXILLARY SENTINEL NODE RADIOACTIVE SEED GUIDED EXCISION (Left Breast)     Patient location during evaluation: PACU Anesthesia Type: Regional and General Level of consciousness: awake and alert Pain management: pain level controlled Vital Signs Assessment: post-procedure vital signs reviewed and stable Respiratory status: spontaneous breathing, nonlabored ventilation and respiratory function stable Cardiovascular status: blood pressure returned to baseline and stable Postop Assessment: no apparent nausea or vomiting Anesthetic complications: no    Last Vitals:  Vitals:   10/31/19 1400 10/31/19 1415  BP: 93/62 107/64  Pulse: 74 79  Resp: 16 (!) 21  Temp:    SpO2: 98% 95%    Last Pain:  Vitals:   10/31/19 1353  TempSrc:   PainSc: 0-No pain                 Pervis Hocking

## 2019-10-31 NOTE — Anesthesia Procedure Notes (Signed)
Anesthesia Regional Block: Pectoralis block   Pre-Anesthetic Checklist: ,, timeout performed, Correct Patient, Correct Site, Correct Laterality, Correct Procedure, Correct Position, site marked, Risks and benefits discussed,  Surgical consent,  Pre-op evaluation,  At surgeon's request and post-op pain management  Laterality: Left  Prep: Maximum Sterile Barrier Precautions used, chloraprep       Needles:  Injection technique: Single-shot  Needle Type: Echogenic Stimulator Needle     Needle Length: 9cm  Needle Gauge: 22     Additional Needles:   Procedures:,,,, ultrasound used (permanent image in chart),,,,  Narrative:  Start time: 10/31/2019 11:40 AM End time: 10/31/2019 11:45 AM Injection made incrementally with aspirations every 5 mL.  Performed by: Personally  Anesthesiologist: Pervis Hocking, DO  Additional Notes: Monitors applied. No increased pain on injection. No increased resistance to injection. Injection made in 5cc increments. Good needle visualization. Patient tolerated procedure well.

## 2019-10-31 NOTE — H&P (Signed)
The patient is a 59 year old female who presents with breast cancer. 28 yof referred for new left breast cancer. she noted a sensitive area with thickening in last month in left breast. she has no prior breast history and she has no family history. she has no dc. she had mm that shows b density breasts. there is irregular mass in uoq of left breast. US shows a 1.4x1x0.7 cm mass and a single abnormal ax node with fct of .5cm. biopsy of node is positive. 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 She underwent mri due to [facility] and was found to have the 2.2 cm known cancer and a 1.5 cm nme 3 c m from this.  There is a single abnormal node.  Right was normal.  Biopsy of this second lesion is grade I invasive mammary carcinoma as well.   We discussed options and she would like to proceed with seed bracketed lumpectomy and a TAD.    Past Surgical History  Breast Biopsy  Left. Colon Removal - Partial  Gallbladder Surgery - Laparoscopic  Resection of Small Bowel   Diagnostic Studies History  Colonoscopy  5-10 years ago Mammogram  within last year Pap Smear  1-5 years ago  Allergies  Penicillin G Potassium *PENICILLINS*  Hives. Allergies Reconciled   Medication History Symbicort (80-4.5MCG/ACT Aerosol, Inhalation) Active. Simvastatin (40MG  Tablet, Oral) Active. Proventil HFA (108 (90 Base)MCG/ACT Aerosol Soln, Inhalation) Active. Medications Reconciled  Social History Alcohol use  Occasional alcohol use. Caffeine use  Coffee, Tea. No drug use  Tobacco use  Current every day smoker.  Family History Depression  Mother.  FH Age at menarche  70 years. Age of menopause  <43 Gravida  1 Length (months) of breastfeeding  3-6 Maternal age  67-25 Para  1  Other Problems Asthma  Cancer  Cholelithiasis  Gastroesophageal Reflux Disease  Hemorrhoids  Hypercholesterolemia  Lump In Breast   Review of Systems  General  Not Present- Appetite Loss, Chills, Fatigue, Fever, Night Sweats, Weight Gain and Weight Loss. Skin Not Present- Change in Wart/Mole, Dryness, Hives, Jaundice, New Lesions, Non-Healing Wounds, Rash and Ulcer. HEENT Present- Seasonal Allergies and Wears glasses/contact lenses. Not Present- Earache, Hearing Loss, Hoarseness, Nose Bleed, Oral Ulcers, Ringing in the Ears, Sinus Pain, Sore Throat, Visual Disturbances and Yellow Eyes. Respiratory Not Present- Bloody sputum, Chronic Cough, Difficulty Breathing, Snoring and Wheezing. Breast Present- Breast Mass. Not Present- Breast Pain, Nipple Discharge and Skin Changes. Cardiovascular Not Present- Chest Pain, Difficulty Breathing Lying Down, Leg Cramps, Palpitations, Rapid Heart Rate, Shortness of Breath and Swelling of Extremities. Gastrointestinal Present- Hemorrhoids and Rectal Pain. Not Present- Abdominal Pain, Bloating, Bloody Stool, Change in Bowel Habits, Chronic diarrhea, Constipation, Difficulty Swallowing, Excessive gas, Gets full quickly at meals, Indigestion, Nausea and Vomiting. Female Genitourinary Not Present- Frequency, Nocturia, Painful Urination, Pelvic Pain and Urgency. Musculoskeletal Not Present- Back Pain, Joint Pain, Joint Stiffness, Muscle Pain, Muscle Weakness and Swelling of Extremities. Neurological Not Present- Decreased Memory, Fainting, Headaches, Numbness, Seizures, Tingling, Tremor, Trouble walking and Weakness. Psychiatric Not Present- Anxiety, Bipolar, Change in Sleep Pattern, Depression, Fearful and Frequent crying. Endocrine Not Present- Cold Intolerance, Excessive Hunger, Hair Changes, Heat Intolerance, Hot flashes and New Diabetes. Hematology Not Present- Blood Thinners, Easy Bruising, Excessive bleeding, Gland problems, HIV and Persistent Infections.   Physical Exam  General Mental Status-Alert. Orientation-Oriented X3. Head and Neck Trachea-midline. Thyroid Gland Characteristics - normal size and  consistency.  Eye Sclera/Conjunctiva - Bilateral-No scleral icterus. Breast Nipples-No Discharge. Breast Lump-No Palpable Breast Mass. Note: hematoma luoq Neurologic Neurologic evaluation reveals -alert and oriented x 3 with no impairment of recent or remote memory. Lymphatic Head & Neck General Head & Neck Lymphatics: Bilateral - Description - Normal. Axillary General Axillary Region: Bilateral - Description - Normal. Note: no [place] adenopathy   Assessment & Plan Rolm Bookbinder MD; 09/09/2019 1:01 PM) BREAST CANCER OF UPPER-OUTER QUADRANT OF LEFT FEMALE BREAST (C50.412)  right seed guided lumpectomy, right TAD We discussed the staging and pathophysiology of breast cancer. We discussed all of the different options for treatment for breast cancer including surgery, chemotherapy, radiation therapy, Herceptin, and antiestrogen therapy. We discussed the options for treatment of the breast cancer which included lumpectomy versus a mastectomy. We discussed the performance of the lumpectomy with radioactive seed placement. We discussed a 5-10% chance of a positive margin requiring reexcision in the operating room. We also discussed that she will likely need radiation therapy if she undergoes lumpectomy. We discussed mastectomy and the postoperative care for that as well. Mastectomy can be followed by reconstruction. The decision for lumpectomy vs mastectomy has no impact on decision for chemotherapy. Most mastectomy patients will not need radiation therapy. We discussed that there is no difference in her survival whether she undergoes lumpectomy with radiation therapy or antiestrogen therapy versus a mastectomy. There is also no real difference between her recurrence in the breast. We discussed TAD for her nodes which I think is reasonable combining seed guided node excision of pos node and sn biopsy.We discussed the performance of that with injection of radioactive tracer. We discussed up to  a 5-10% risk lifetime of chronic shoulder pain as well as lymphedema We discussed the risks of operation

## 2019-11-01 ENCOUNTER — Encounter: Payer: Self-pay | Admitting: *Deleted

## 2019-11-02 LAB — SURGICAL PATHOLOGY

## 2019-11-03 ENCOUNTER — Encounter (HOSPITAL_COMMUNITY): Payer: Self-pay | Admitting: *Deleted

## 2019-11-03 NOTE — Progress Notes (Signed)
I have emailed pathology and ordered Oncotype Dx on accession # [id number].

## 2019-11-17 ENCOUNTER — Ambulatory Visit: Payer: Commercial Managed Care - PPO | Attending: General Surgery

## 2019-11-17 ENCOUNTER — Other Ambulatory Visit: Payer: Self-pay

## 2019-11-17 DIAGNOSIS — Z170 Estrogen receptor positive status [ER+]: Secondary | ICD-10-CM | POA: Insufficient documentation

## 2019-11-17 DIAGNOSIS — M25512 Pain in left shoulder: Secondary | ICD-10-CM | POA: Insufficient documentation

## 2019-11-17 DIAGNOSIS — C50412 Malignant neoplasm of upper-outer quadrant of left female breast: Secondary | ICD-10-CM | POA: Insufficient documentation

## 2019-11-17 DIAGNOSIS — M25612 Stiffness of left shoulder, not elsewhere classified: Secondary | ICD-10-CM | POA: Diagnosis present

## 2019-11-17 DIAGNOSIS — R600 Localized edema: Secondary | ICD-10-CM | POA: Insufficient documentation

## 2019-11-17 NOTE — Therapy (Signed)
[place], [place], [address] Phone: [phone]   Fax:  [phone]  Physical Therapy Evaluation  Patient Details  Name: Monica Russell MRN: [id number] Date of Birth: 23-May-1961 Referring Provider (PT): Rolm Bookbinder MD   Encounter Date: 11/17/2019  PT End of Session - 11/17/19 0957    Visit Number  1    Number of Visits  3    Date for PT Re-Evaluation  01/05/20    PT Start Time  0902    PT Stop Time  0958    PT Time Calculation (min)  56 min    Activity Tolerance  Patient tolerated treatment well    Behavior During Therapy  [facility] for tasks assessed/performed       Past Medical History:  Diagnosis Date  . Breast cancer ([place])   . High cholesterol   . IBS (irritable bowel syndrome)   . Renal cell cancer [facility]     Past Surgical History:  Procedure Laterality Date  . BREAST LUMPECTOMY WITH RADIOACTIVE SEED AND SENTINEL LYMPH NODE BIOPSY Left 10/31/2019   Procedure: LEFT BREAST LUMPECTOMY X 2 WITH RADIOACTIVE SEED AND SENTINEL LYMPH NODE BIOPSY, LEFT AXILLARY SENTINEL NODE RADIOACTIVE SEED GUIDED EXCISION;  Surgeon: Rolm Bookbinder, MD;  Location: [facility];  Service: General;  Laterality: Left;  . CHOLECYSTECTOMY    . COLONOSCOPY  04/28/2012   Procedure: COLONOSCOPY;  Surgeon: Rogene Houston, MD;  [facility];  Service: Endoscopy;  Laterality: N/A;  155  . PARTIAL COLECTOMY     for diverticulitis  . PARTIAL NEPHRECTOMY     11 yrs ago for a renal carcinoma, right    There were no vitals filed for this visit.   Subjective Assessment - 11/17/19 0907    Subjective  Pt states that she had a lumpectomy with lymph node biopsy. She states that she went to the radiologist and he noticed some swelling in her breast but he thinks it is just post-op swelling. She reports the area that bothers her the most is an area right under her L axilla. Otherwise she states that she  has been doing great and feels pretty good after surgery. She states that she is not starting radiation for another couple of weeks and will be doing this in [place] due to she lives in [place] and it is a 40 minute drive for her to get here.    Pertinent History  L breast cancer upper outer and into the upper inner quadrants, stage IB (cT1c cN1(f) M0, pT2 pN1a(sn), grade 2, ER+PR+HER2-, Ki-67: 2%, Oncotype: 9), Lumpectomy with sentinal lymph node biopsy on 10/31/2019    Patient Stated Goals  I want to avoid getting lymphedema.    Currently in Pain?  Yes    Pain Score  1     Pain Location  Axilla    Pain Orientation  Left    Pain Descriptors / Indicators  Aching    Pain Type  Surgical pain    Pain Onset  1 to 4 weeks ago    Pain Frequency  Intermittent    Aggravating Factors   moving her arm/lifting it up    Pain Relieving Factors  rest    Effect of Pain on Daily Activities  pt is able to do what she wants but uses her R arm.         [facility] PT Assessment - 11/17/19 0001      Assessment  Medical Diagnosis  L breast cancer     Referring Provider (PT)  Rolm Bookbinder MD    Onset Date/Surgical Date  10/31/19    Hand Dominance  Right    Prior Therapy  None      Precautions   Precautions  Other (comment)    Precaution Comments  L breast cancer       Restrictions   Weight Bearing Restrictions  No      Balance Screen   Has the patient fallen in the past 6 months  No    Has the patient had a decrease in activity level because of a fear of falling?   No    Is the patient reluctant to leave their home because of a fear of falling?   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       Posture/Postural Control   Posture/Postural Control  Postural limitations    Postural Limitations  Rounded Shoulders;Forward head      ROM / Strength   AROM / PROM / Strength  AROM      AROM   AROM Assessment Site  Shoulder    Right/Left Shoulder  Right;Left    Right Shoulder Flexion  140 Degrees    Right Shoulder ABduction  120 Degrees    Right Shoulder Internal Rotation  90 Degrees    Right Shoulder External Rotation  82 Degrees    Left Shoulder Flexion  148 Degrees    Left Shoulder ABduction  109 Degrees    Left Shoulder Internal Rotation  85 Degrees    Left Shoulder External Rotation  65 Degrees        LYMPHEDEMA/ONCOLOGY QUESTIONNAIRE - 11/17/19 0922      Type   Cancer Type  L breast cancer      Surgeries   Lumpectomy Date  10/31/19    Sentinel Lymph Node Biopsy Date  10/31/19    Number Lymph Nodes Removed  4      Treatment   Active Chemotherapy Treatment  No    Past Chemotherapy Treatment  No    Active Radiation Treatment  No    Past Radiation Treatment  No    Current Hormone Treatment  No    Past Hormone Therapy  No      What other symptoms do you have   Are you Having Heaviness or Tightness  Yes    Are you having Pain  Yes    Are you having pitting edema  No    Is it Hard or Difficult finding clothes that fit  No    Do you have infections  No    Is there Decreased scar mobility  Yes      Lymphedema Assessments   Lymphedema Assessments  Upper extremities      Right Upper Extremity Lymphedema   15 cm Proximal to Olecranon Process  28.1 cm    10 cm Proximal to Olecranon Process  27.5 cm    Olecranon Process  24.2 cm  15 cm Proximal to Ulnar Styloid Process  22.8 cm    10 cm Proximal to Ulnar Styloid Process  20.7 cm    Just Proximal to Ulnar Styloid Process  14.5 cm    Across Hand at [employer]  19 cm    At [place] of 2nd Digit  6 cm      Left Upper Extremity  Lymphedema   15 cm Proximal to Olecranon Process  28.3 cm    10 cm Proximal to Olecranon Process  26.5 cm    Olecranon Process  23.8 cm    15 cm Proximal to Ulnar Styloid Process  22.6 cm    10 cm Proximal to Ulnar Styloid Process  20.3 cm    Just Proximal to Ulnar Styloid Process  14.4 cm    Across Hand at [employer]  175 cm    At [place] of 2nd Digit  5.9 cm          Quick Dash - 11/17/19 0001    Open a tight or new jar  No difficulty    Do heavy household chores (wash walls, wash floors)  No difficulty    Carry a shopping bag or briefcase  No difficulty    Wash your back  No difficulty    Use a knife to cut food  No difficulty    Recreational activities in which you take some force or impact through your arm, shoulder, or hand (golf, hammering, tennis)  No difficulty    During the past week, to what extent has your arm, shoulder or hand problem interfered with your normal social activities with family, friends, neighbors, or groups?  Not at all    During the past week, to what extent has your arm, shoulder or hand problem limited your work or other regular daily activities  Not at all    Arm, shoulder, or hand pain.  Mild    Tingling (pins and needles) in your arm, shoulder, or hand  None    Difficulty Sleeping  No difficulty    DASH Score  2.27 %        Objective measurements completed on examination: See above findings.      [facility] PT Treatment/Exercise - 11/17/19 0001      Exercises   Exercises  Shoulder      Shoulder Exercises: Supine   External Rotation  10 reps;Both;AROM    External Rotation Limitations  Demonstration with good return demonstration and VC to avoid pain.       Shoulder Exercises: Seated   Other Seated Exercises  Post op exercises including flexion, external rotation w/scapular squeeze and external rotation with abduction 10x with demonstration and return demonstration. VC to avoid pain and feel stretching throughout.       Shoulder  Exercises: Standing   Other Standing Exercises  Wall wash into flexion and abduction with demonstration and VC to prevent shoulder hiking 10x 5 second hold      Manual Therapy   Manual Therapy  Soft tissue mobilization;Passive ROM    Soft tissue mobilization  Light pettrisage following effleurage to warm the tissue then pettrisage during P/ROM along the cording and the pectoralis major with improved mobility.     Passive ROM  P/ROM with pettrisage along cording             PT Education - 11/17/19 0956    Education Details  Pt was educated on lymphedema vs. post op edema and her risk  of lymphedema now as well as what it will be after radiation. She was provided with information on the ABC class and discussed risk reduction practices for lymphedema. Pt was educated on HEP and was educated on cording and how to help stretch her tissue.    Person(s) Educated  Patient    Methods  Explanation;Demonstration;Verbal cues;Handout    Comprehension  Verbalized understanding;Returned demonstration       PT Short Term Goals - 11/17/19 1006      PT SHORT TERM GOAL #1   Title  Pt will be independent with HEP in order to demonstrate autonomy of care.    Baseline  Pt just recieved HEP    Time  3    Period  Weeks    Status  New    Target Date  12/15/19        PT Long Term Goals - 11/17/19 1012      PT LONG TERM GOAL #1   Title  Pt will improve L shoulder abduction to 125 degrees within 6 weeks in order to demonstrate objective improvement in functional ROM.    Baseline  110 degrees L shoulder abduction    Time  6    Period  Weeks    Status  New    Target Date  01/05/20      PT LONG TERM GOAL #2   Title  Pt will report 50% improvement in pain/stiffness of the L shoulder and that she is using it 50% more than at her initial evaluation in order to demonstrate subjective improvement in functionality.    Baseline  1/10 pain pt states she uses her RUE instead of her LUE    Time  6    Period   Weeks    Status  New    Target Date  01/05/20      PT LONG TERM GOAL #3   Title  Pt will state understanding of lymphedema risk reduction practices and how to manage edema if it is noticed including compression, exercise and MLD within 6 weeks to reduce risk for lymphedema.    Baseline  Pt currently is unaware of how to manage or reduce lymphedema risk.    Time  6    Period  Weeks    Status  New    Target Date  01/05/20             Plan - 11/17/19 0958    Clinical Impression Statement  Pt presents to physical therapy with decreased ROM in her L shoulder abduction compared to her R shoulder abduction. She does demonstrate most likely post-op edema in her L breast. She demonstrates no significant difference in circumferential measurements. Cording is present in the L axilla from the incision area near the lateral areola to the mid medial brachium into abduction; improved following petrissage with active and passive ROM into abduction and D2 flexion pattern. Pt was provided with post-op exercises and discussed how to stretch. 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 If pt requires any further assistance POC can be updated at that time.    Personal Factors and Comorbidities  Comorbidity 1    Comorbidities  L lumpectomy with lymph node removal    Stability/Clinical Decision Making  Stable/Uncomplicated    Clinical Decision Making  Low  Rehab Potential  Good    PT Frequency  Biweekly    PT Duration  6 weeks    PT Treatment/Interventions  Therapeutic activities;Therapeutic exercise;Neuromuscular re-education;Manual techniques    PT Next Visit Plan  Teach MLD for the L breast, assess exercises and update if necessary. Assess if pt needs to return.    PT Home Exercise Plan  Access Code: [email or web address] and post op  exercises    Consulted and Agree with Plan of Care  Patient       Patient will benefit from skilled therapeutic intervention in order to improve the following deficits and impairments:  Increased edema, Decreased range of motion, Pain  Visit Diagnosis: Malignant neoplasm of upper-outer quadrant of left breast in female, estrogen receptor positive ([place])  Localized edema  Acute pain of left shoulder  Stiffness of left shoulder, not elsewhere classified     Problem List Patient Active Problem List   Diagnosis Date Noted  . Malignant neoplasm of upper-outer quadrant of left female breast ([place]) 09/15/2019  . Pain of left breast 08/17/2019  . Mass of upper outer quadrant of left breast 08/17/2019  . Encounter for gynecological examination with Papanicolaou smear of cervix 08/11/2017  . Rectal pressure 08/11/2017  . Prediabetes 10/29/2015  . Personal history of tobacco use, presenting hazards to health 04/18/2015  . Vitamin D deficiency 07/04/2013  . Hyperlipemia 07/01/2013  . Rectal itching 08/13/2012  . GERD (gastroesophageal reflux disease) 04/07/2012  . Diarrhea 04/07/2012  . Diverticulitis 04/07/2012    Ander Purpura, PT 11/17/2019, 10:15 AM  [place], [place], [address] Phone: [phone]   Fax:  [phone]  Name: Monica Russell MRN: [id number] Date of Birth: [birth year]

## 2019-11-17 NOTE — Patient Instructions (Signed)
Access Code: [email or web address] URL: [email or web address] Date: 11/17/2019 Prepared by: Tomma Rakers  Exercises Supine Chest Stretch with Elbows Bent - 1 x daily - 5 x weekly - 1 sets - 10 reps - 5 seconds hold Shoulder Flexion Wall Slide with Towel - 1 x daily - 7 x weekly - 1 sets - 10 reps - 5 second hold hold Standing Shoulder Abduction Slides at Wall - 1 x daily - 7 x weekly - 1 sets - 10 reps - 5 second hold hold   And post op exercises.

## 2019-11-28 ENCOUNTER — Other Ambulatory Visit: Payer: Self-pay

## 2019-11-28 ENCOUNTER — Inpatient Hospital Stay (HOSPITAL_COMMUNITY): Payer: Commercial Managed Care - PPO | Attending: Hematology | Admitting: Hematology

## 2019-11-28 ENCOUNTER — Encounter (HOSPITAL_COMMUNITY): Payer: Self-pay | Admitting: Hematology

## 2019-11-28 VITALS — BP 120/70 | HR 83 | Temp 97.1°F | Resp 16 | Wt 152.0 lb

## 2019-11-28 DIAGNOSIS — Z170 Estrogen receptor positive status [ER+]: Secondary | ICD-10-CM | POA: Diagnosis not present

## 2019-11-28 DIAGNOSIS — Z780 Asymptomatic menopausal state: Secondary | ICD-10-CM | POA: Diagnosis not present

## 2019-11-28 DIAGNOSIS — C50412 Malignant neoplasm of upper-outer quadrant of left female breast: Secondary | ICD-10-CM | POA: Diagnosis present

## 2019-11-28 DIAGNOSIS — Z85528 Personal history of other malignant neoplasm of kidney: Secondary | ICD-10-CM | POA: Diagnosis not present

## 2019-11-28 MED ORDER — ANASTROZOLE 1 MG PO TABS: 1.0000 mg | ORAL_TABLET | Freq: Every day | ORAL | 2 refills | Status: DC

## 2019-11-28 NOTE — Progress Notes (Signed)
[place], [place] [address]   CLINIC:  Medical Oncology/Hematology  PCP:  Kathyrn Drown, MD [address] [place] [address] [phone]   REASON FOR VISIT:  Follow-up for left breast cancer.  PRIOR THERAPY: -Left lumpectomy and SLNB on 10/31/2019.  NGS Results: -Oncotype DX with recurrence score of 9, 9-year distant recurrence with tamoxifen/AI of 12%.  CURRENT THERAPY: Anastrozole 1 mg daily.  BRIEF ONCOLOGIC HISTORY:  Oncology History  Mass of upper outer quadrant of left breast  08/17/2019 Initial Diagnosis   Mass of upper outer quadrant of left breast   11/11/2019 Genetic Testing        Malignant neoplasm of upper-outer quadrant of left female breast ([place])  09/15/2019 Initial Diagnosis   Malignant neoplasm of upper-outer quadrant of left female breast ([place])   09/15/2019 Cancer Staging   Staging form: Breast, AJCC 8th Edition - Clinical stage from 09/15/2019: Stage IIA (cT2, cN1(f), cM0, G2, ER+, PR+, HER2-) - Signed by Derek Jack, MD on 11/28/2019     CANCER STAGING: Cancer Staging Malignant neoplasm of upper-outer quadrant of left female breast [facility]) Staging form: Breast, AJCC 8th Edition - Clinical stage from 09/15/2019: Stage IIA (cT2, cN1(f), cM0, G2, ER+, PR+, HER2-) - Signed by Derek Jack, MD on 11/28/2019    INTERVAL HISTORY:  Ms. Monica Russell 59 y.o. female seen for follow-up after left lumpectomy and sentinel lymph node biopsy.  She has recovered well from surgery.  Reports appetite and energy levels of 8%.  She was evaluated by Dr.Yanagihara.  She is undergoing simulation CT scan tomorrow.  Denies any new onset pains.  Does not report having a history of osteoporosis.    REVIEW OF SYSTEMS:  Review of Systems  All other systems reviewed and are negative.    PAST MEDICAL/SURGICAL HISTORY:  Past Medical History:  Diagnosis Date  . Breast cancer ([place])   . High cholesterol   . IBS  (irritable bowel syndrome)   . Renal cell cancer [facility])    Past Surgical History:  Procedure Laterality Date  . BREAST LUMPECTOMY WITH RADIOACTIVE SEED AND SENTINEL LYMPH NODE BIOPSY Left 10/31/2019   Procedure: LEFT BREAST LUMPECTOMY X 2 WITH RADIOACTIVE SEED AND SENTINEL LYMPH NODE BIOPSY, LEFT AXILLARY SENTINEL NODE RADIOACTIVE SEED GUIDED EXCISION;  Surgeon: Rolm Bookbinder, MD;  Location: [facility];  Service: General;  Laterality: Left;  . CHOLECYSTECTOMY    . COLONOSCOPY  04/28/2012   Procedure: COLONOSCOPY;  Surgeon: Rogene Houston, MD;  [facility];  Service: Endoscopy;  Laterality: N/A;  155  . PARTIAL COLECTOMY     for diverticulitis  . PARTIAL NEPHRECTOMY     11 yrs ago for a renal carcinoma, right     SOCIAL HISTORY:  Social History   Socioeconomic History  . Marital status: Married    Spouse name: Not on file  . Number of children: 2  . Years of education: Not on file  . Highest education level: Not on file  Occupational History  . Occupation: Retired  Tobacco Use  . Smoking status: Current Every Day Smoker    Packs/day: 0.50    Years: 40.00    Pack years: 20.00    Types: Cigarettes  . Smokeless tobacco: Never Used  . Tobacco comment: 10-12 a day  Substance and Sexual Activity  . Alcohol use: No    Alcohol/week: 0.0 standard drinks    Comment: rarely  . Drug use: No  .  Sexual activity: Yes    Birth control/protection: Post-menopausal  Other Topics Concern  . Not on file  Social History Narrative   Pt's husband has dementia   Social Determinants of Health   Financial Resource Strain: Low Risk   . Difficulty of Paying Living Expenses: Not hard at all  Food Insecurity: No Food Insecurity  . Worried About Charity fundraiser in the Last Year: Never true  . Ran Out of Food in the Last Year: Never true  Transportation Needs: No Transportation Needs  . Lack of Transportation (Medical): No  . Lack of Transportation  (Non-Medical): No  Physical Activity: Insufficiently Active  . Days of Exercise per Week: 3 days  . Minutes of Exercise per Session: 30 min  Stress: Stress Concern Present  . Feeling of Stress : To some extent  Social Connections:   . Frequency of Communication with Friends and Family:   . Frequency of Social Gatherings with Friends and Family:   . Attends Religious Services:   . Active Member of Clubs or Organizations:   . Attends Archivist Meetings:   Marland Kitchen Marital Status:   Intimate Partner Violence: Not At Risk  . Fear of Current or Ex-Partner: No  . Emotionally Abused: No  . Physically Abused: No  . Sexually Abused: No    FAMILY HISTORY:  Family History  Problem Relation Age of Onset  . Hypertension Mother   . Mental illness Mother   . Heart disease Maternal Grandmother   . Diabetes Maternal Grandmother   . Heart disease Maternal Grandfather   . Dementia Maternal Grandfather   . Mental illness Brother     CURRENT MEDICATIONS:  Outpatient Encounter Medications as of 11/28/2019  Medication Sig  . simvastatin (ZOCOR) 40 MG tablet TAKE 1 TABLET BY MOUTH EVERY DAY FOR CHOLESTEROL  . albuterol (PROVENTIL) (2.5 MG/3ML) 0.083% nebulizer solution Use via neb q 4 hours prn wheezing (Patient not taking: Reported on 11/28/2019)  . anastrozole (ARIMIDEX) 1 MG tablet Take 1 tablet (1 mg total) by mouth daily.  . SYMBICORT 80-4.5 MCG/ACT inhaler TAKE 2 PUFFS BY MOUTH TWICE A DAY (Patient not taking: No sig reported)  . [DISCONTINUED] oxyCODONE (OXY IR/ROXICODONE) 5 MG immediate release tablet Take 1 tablet (5 mg total) by mouth every 6 (six) hours as needed. (Patient not taking: Reported on 11/17/2019)   No facility-administered encounter medications on file as of 11/28/2019.    ALLERGIES:  Allergies  Allergen Reactions  . Penicillins Hives     PHYSICAL EXAM:  ECOG Performance status: 0  Vitals:   11/28/19 1413  BP: 120/70  Pulse: 83  Resp: 16  Temp: (!) 97.1 F  (36.2 C)  SpO2: 97%   Filed Weights   11/28/19 1413  Weight: 152 lb (68.9 kg)   Physical Exam Vitals reviewed.  Constitutional:      Appearance: Normal appearance.  HENT:     Head: Normocephalic.  Neurological:     General: No focal deficit present.     Mental Status: She is alert and oriented to person, place, and time.  Psychiatric:        Mood and Affect: Mood normal.        Behavior: Behavior normal.      LABORATORY DATA:  I have reviewed the labs as listed.  CBC    Component Value Date/Time   WBC 7.4 03/07/2015 0808   RBC 4.49 03/07/2015 0808   HGB 13.1 03/07/2015 0808   HCT 39.2 03/07/2015  0808   PLT 286 03/07/2015 0808   MCV 87.3 03/07/2015 0808   MCH 29.2 03/07/2015 0808   MCHC 33.4 03/07/2015 0808   RDW 13.7 03/07/2015 0808   LYMPHSABS 2.5 03/07/2015 0808   MONOABS 0.4 03/07/2015 0808   EOSABS 0.1 03/07/2015 0808   BASOSABS 0.1 03/07/2015 0808   CMP Latest Ref Rng & Units 09/20/2019 08/23/2018 08/23/2018  Glucose 65 - 99 mg/dL - - 106(H)  BUN 6 - 24 mg/dL - - 6  Creatinine 0.44 - 1.00 mg/dL 0.60 0.62 0.66  Sodium 134 - 144 mmol/L - - 140  Potassium 3.5 - 5.2 mmol/L - - 4.9  Chloride 96 - 106 mmol/L - - 102  CO2 20 - 29 mmol/L - - 23  Calcium 8.7 - 10.2 mg/dL - - 9.1  Total Protein 6.0 - 8.3 g/dL - - -  Total Bilirubin 0.3 - 1.2 mg/dL - - -  Alkaline Phos 39 - 117 U/L - - -  AST 0 - 37 U/L - - -  ALT 0 - 35 U/L - - -    DIAGNOSTIC IMAGING:  I have reviewed scans.  ASSESSMENT & PLAN:  Malignant neoplasm of upper-outer quadrant of left female breast ([place]) 1.  Stage II (PT2PN1) infiltrating lobular carcinoma of the left breast: -Left lumpectomy and sentinel lymph node biopsy on 10/31/2019 with 3.7 cm grade 2 infiltrating lobular carcinoma, margins negative, 1/3 lymph nodes positive, Ki-67 2%, ER/PR 100%. -Oncotype DX score was 9 with 9-year distant recurrence with tamoxifen/AI was 12%.  No benefit from adjuvant chemotherapy. -We have discussed the  pathology report and Oncotype DX results in detail.  I did not recommend any adjuvant chemotherapy. -I have recommended antiestrogen therapy with anastrozole for at least 5, possibly 10 years. -We talked about side effects including but not limited to hot flashes, decreased bone mineral density, musculoskeletal symptoms, regions of thromboembolic phenomena. -She has seen Dr.Yanagihara for radiation therapy. -I have sent a prescription for anastrozole.  I will obtain a baseline bone density test.  We will also check vitamin D level.  We will see her back in 3 months for follow-up with repeat labs and see how she is tolerating it. -I have discussed surveillance plan with follow-up visits every 4 months for 3 years.  It will be followed by every 6 months for years 4 and 5 and once a year subsequently.  She will continue mammograms yearly.  2.  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     Orders placed this encounter:  Orders Placed This Encounter  Procedures  . DG Bone Density  . Occult blood x 1 card to lab, stool  . Occult blood x 1 card to lab, stool  . Occult blood x 1 card to lab, stool  . CBC with Differential  . Comprehensive metabolic panel  . Vitamin D 25 hydroxy      Derek Jack, MD [place] [phone]

## 2019-11-28 NOTE — Assessment & Plan Note (Addendum)
1.  Stage II (PT2PN1) infiltrating lobular carcinoma of the left breast: -Left lumpectomy and sentinel lymph node biopsy on 10/31/2019 with 3.7 cm grade 2 infiltrating lobular carcinoma, margins negative, 1/3 lymph nodes positive, Ki-67 2%, ER/PR 100%. -Oncotype DX score was 9 with 9-year distant recurrence with tamoxifen/AI was 12%.  No benefit from adjuvant chemotherapy. -We have discussed the pathology report and Oncotype DX results in detail.  I did not recommend any adjuvant chemotherapy. -I have recommended antiestrogen therapy with anastrozole for at least 5, possibly 10 years. -We talked about side effects including but not limited to hot flashes, decreased bone mineral density, musculoskeletal symptoms, regions of thromboembolic phenomena. -She has seen Dr.Yanagihara for radiation therapy. -I have sent a prescription for anastrozole.  I will obtain a baseline bone density test.  We will also check vitamin D level.  We will see her back in 3 months for follow-up with repeat labs and see how she is tolerating it. -I have discussed surveillance plan with follow-up visits every 4 months for 3 years.  It will be followed by every 6 months for years 4 and 5 and once a year subsequently.  She will continue mammograms yearly.  2.  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

## 2019-11-28 NOTE — Patient Instructions (Signed)
[place] at [facility] Discharge Instructions  You were seen today by Dr. Delton Coombes. He went over your recent test results. He discussed your Oncotype blood test, which shows that you don't need Chemotherapy. Instead he will start you on an antiestrogen pill called Anastrozole (Arimidex), it should be taken daily at the same time. It suppresses the estrogen production in your body. You will take this medication for at least 5 years and maybe longer. He will schedule you for a bone density test prior to your next visit. He will see you back in 3 months for labs and follow up.   Thank you for choosing [place] at [facility] to provide your oncology and hematology care.  To afford each patient quality time with our provider, please arrive at least 15 minutes before your scheduled appointment time.   If you have a lab appointment with the [facility] please come in thru the  Main Entrance and check in at the main information desk  You need to re-schedule your appointment should you arrive 10 or more minutes late.  We strive to give you quality time with our providers, and arriving late affects you and other patients whose appointments are after yours.  Also, if you no show three or more times for appointments you may be dismissed from the clinic at the providers discretion.     Again, thank you for choosing [facility].  Our hope is that these requests will decrease the amount of time that you wait before being seen by our physicians.       _____________________________________________________________  Should you have questions after your visit to [facility], please contact our office at [phone] between the hours of 8:00 a.m. and 4:30 p.m.  Voicemails left after 4:00 p.m. will not be returned until the following business day.  For prescription refill requests, have your pharmacy contact our office and allow 72 hours.     Cancer Center Support Programs:   > Cancer Support Group  2nd Tuesday of the month 1pm-2pm, Journey Room

## 2019-11-30 ENCOUNTER — Ambulatory Visit: Payer: Commercial Managed Care - PPO

## 2019-12-05 ENCOUNTER — Other Ambulatory Visit: Payer: Self-pay

## 2019-12-05 ENCOUNTER — Ambulatory Visit: Payer: Commercial Managed Care - PPO

## 2019-12-05 DIAGNOSIS — M25512 Pain in left shoulder: Secondary | ICD-10-CM

## 2019-12-05 DIAGNOSIS — R600 Localized edema: Secondary | ICD-10-CM

## 2019-12-05 DIAGNOSIS — M25612 Stiffness of left shoulder, not elsewhere classified: Secondary | ICD-10-CM

## 2019-12-05 DIAGNOSIS — Z170 Estrogen receptor positive status [ER+]: Secondary | ICD-10-CM

## 2019-12-05 DIAGNOSIS — C50412 Malignant neoplasm of upper-outer quadrant of left female breast: Secondary | ICD-10-CM | POA: Diagnosis not present

## 2019-12-05 NOTE — Therapy (Addendum)
[place], [place], [address] Phone: [phone]   Fax:  [phone]  Physical Therapy Treatment  Patient Details  Name: Monica Russell MRN: [id number] Date of Birth: [birth year] Referring Provider (PT): Rolm Bookbinder MD   Encounter Date: 12/05/2019  PT End of Session - 12/05/19 1304    Visit Number  2    Number of Visits  3    Date for PT Re-Evaluation  01/05/20    PT Start Time  0938    PT Stop Time  1358    PT Time Calculation (min)  54 min    Activity Tolerance  Patient tolerated treatment well    Behavior During Therapy  [facility] for tasks assessed/performed       Past Medical History:  Diagnosis Date  . Breast cancer ([place])   . High cholesterol   . IBS (irritable bowel syndrome)   . Renal cell cancer [facility])     Past Surgical History:  Procedure Laterality Date  . BREAST LUMPECTOMY WITH RADIOACTIVE SEED AND SENTINEL LYMPH NODE BIOPSY Left 10/31/2019   Procedure: LEFT BREAST LUMPECTOMY X 2 WITH RADIOACTIVE SEED AND SENTINEL LYMPH NODE BIOPSY, LEFT AXILLARY SENTINEL NODE RADIOACTIVE SEED GUIDED EXCISION;  Surgeon: Rolm Bookbinder, MD;  Location: [facility];  Service: General;  Laterality: Left;  . CHOLECYSTECTOMY    . COLONOSCOPY  04/28/2012   Procedure: COLONOSCOPY;  Surgeon: Rogene Houston, MD;  [facility];  Service: Endoscopy;  Laterality: N/A;  155  . PARTIAL COLECTOMY     for diverticulitis  . PARTIAL NEPHRECTOMY     11 yrs ago for a renal carcinoma, right    There were no vitals filed for this visit.  Subjective Assessment - 12/05/19 1304    Subjective  Pt reports that she has been doing her exercises at home in the shower becuase that is when she has time to do her exercises.    Pertinent History  L breast cancer upper outer and into the upper inner quadrants, stage IB (cT1c cN1(f) M0, pT2 pN1a(sn), grade 2, ER+PR+HER2-, Ki-67: 2%, Oncotype: 9),  Lumpectomy with sentinal lymph node biopsy on 10/31/2019    Patient Stated Goals  I want to avoid getting lymphedema.    Currently in Pain?  No/denies    Pain Score  0-No pain                        [facility] Adult PT Treatment/Exercise - 12/05/19 0001      Manual Therapy   Manual Therapy  Manual Lymphatic Drainage (MLD)    Manual Lymphatic Drainage (MLD)  In supine: short neck, bil axillary and L inguinal nodes, anterior inter-axillary and L axillo-inguinal anastomosis; superior/inferior breast toward corresponding anastomosis, re-worked anastomosis, 5 diaphragmatic breathes, Pt was then educated on performing self MLD for the L breast with step by step instructions and extra time spent on pressure, direction, skin stretch             PT Education - 12/05/19 1345    Education Details  Pt was educated on self MLD of the L breast with tactile cueing using hand over hand, VC to lift up and prevent sliding as well as pressure and direction. Discussed anchoring the fascia and performing flexion, abduction, horizontal abduction and D2 flextion pattern to help break up scar tissue.    Person(s) Educated  Patient    Methods  Explanation;Demonstration;Tactile cues;Verbal cues;Handout  Comprehension  Verbalized understanding;Returned demonstration       PT Short Term Goals - 11/17/19 1006      PT SHORT TERM GOAL #1   Title  Pt will be independent with HEP in order to demonstrate autonomy of care.    Baseline  Pt just recieved HEP    Time  3    Period  Weeks    Status  New    Target Date  12/15/19        PT Long Term Goals - 11/17/19 1012      PT LONG TERM GOAL #1   Title  Pt will improve L shoulder abduction to 125 degrees within 6 weeks in order to demonstrate objective improvement in functional ROM.    Baseline  110 degrees L shoulder abduction    Time  6    Period  Weeks    Status  New    Target Date  01/05/20      PT LONG TERM GOAL #2   Title  Pt will  report 50% improvement in pain/stiffness of the L shoulder and that she is using it 50% more than at her initial evaluation in order to demonstrate subjective improvement in functionality.    Baseline  1/10 pain pt states she uses her RUE instead of her LUE    Time  6    Period  Weeks    Status  New    Target Date  01/05/20      PT LONG TERM GOAL #3   Title  Pt will state understanding of lymphedema risk reduction practices and how to manage edema if it is noticed including compression, exercise and MLD within 6 weeks to reduce risk for lymphedema.    Baseline  Pt currently is unaware of how to manage or reduce lymphedema risk.    Time  6    Period  Weeks    Status  New    Target Date  01/05/20            Plan - 12/05/19 1304    Clinical Impression Statement  Pt presents to physical therapy wtih continued decreased ROM in her L shoulder with cording noted from the incision in the L axilla to the mid medial brachium; Pt was educated on how to stabilize the fascia while performing A/ROM to help break up scar tissue around the cording to decrease limitations. She will continue with her current HEP at this time. MLD was performed for the L breast followed by education on self MLD with extra focus on skin stretch, avoiding sliding, decreasing pressure using the whole hand rather than finger tips and softer touch. Discussed lifing her hand between each hand placement to prevent sliding. Pt will benefit from continued POC she will contact clinic if she needs another appointment due to obligations at home and distance to clinic.    Personal Factors and Comorbidities  Comorbidity 1    Comorbidities  L lumpectomy with lymph node removal    Rehab Potential  Good    PT Frequency  Biweekly    PT Duration  6 weeks    PT Treatment/Interventions  Therapeutic activities;Therapeutic exercise;Neuromuscular re-education;Manual techniques    PT Next Visit Plan  Teach MLD for the L breast, assess exercises  and update if necessary. Assess if pt needs to return.    PT Home Exercise Plan  Access Code: [email or web address] and post op exercises, MLD and self mobilization of fascia    Consulted and Agree  with Plan of Care  Patient       Patient will benefit from skilled therapeutic intervention in order to improve the following deficits and impairments:  Increased edema, Decreased range of motion, Pain  Visit Diagnosis: Malignant neoplasm of upper-outer quadrant of left breast in female, estrogen receptor positive (HCC)  Localized edema  Acute pain of left shoulder  Stiffness of left shoulder, not elsewhere classified     Problem List Patient Active Problem List   Diagnosis Date Noted  . Malignant neoplasm of upper-outer quadrant of left female breast ([place]) 09/15/2019  . Pain of left breast 08/17/2019  . Mass of upper outer quadrant of left breast 08/17/2019  . Encounter for gynecological examination with Papanicolaou smear of cervix 08/11/2017  . Rectal pressure 08/11/2017  . Prediabetes 10/29/2015  . Personal history of tobacco use, presenting hazards to health 04/18/2015  . Vitamin D deficiency 07/04/2013  . Hyperlipemia 07/01/2013  . Rectal itching 08/13/2012  . GERD (gastroesophageal reflux disease) 04/07/2012  . Diarrhea 04/07/2012  . Diverticulitis 04/07/2012    PHYSICAL THERAPY DISCHARGE SUMMARY  Plan:                                                    Patient goals were not met. Patient is being discharged due to not returning since the last visit.  ?????        Ander Purpura, PT 12/05/2019, 2:01 PM  [place], [place], [address] Phone: [phone]   Fax:  [phone]  Name: MERION CATON MRN: [id number] Date of Birth: August 17, 1960

## 2019-12-05 NOTE — Patient Instructions (Addendum)
Place your R hand near the scar in your L armpit and hold onto the skin without a lot of pressure just let your skin stick naturally to itself. Perform all types of movement about 5-10 times with the L arm going back, out to the side, up in a Y and down on the table into a T.   Remember no sliding; pick your hand up after your have stretched your skin as far as it will stretch then move to the next spot.   Self manual lymph drainage: Perform this sequence once a day.  Only give enough pressure no your skin to make the skin move.  Diaphragmatic - Supine   Inhale through nose making navel move out toward hands. Exhale through puckered lips, hands follow navel in. Repeat _5__ times. Rest _10__ seconds between repeats.   Copyright  VHI. All rights reserved.  Hug yourself.  Do circles at your neck just above your collarbones.  Repeat this 10 times.  Axilla - One at a Time   Using full weight of flat hand and fingers at center of both armpits one at a time, make _10__ in-place circles.   Copyright  VHI. All rights reserved.  LEG: Inguinal Nodes Stimulation   With small finger side of hand against hip crease on involved side, gently perform circles at the crease. Repeat __10_ times.   Copyright  VHI. All rights reserved.  1) Axilla to Inguinal Nodes - Sweep   On involved side, sweep _4__ times from armpit along side of trunk to hip crease.  Now gently stretch skin from the involved side to the uninvolved side across the chest at the shoulder line.  Repeat that 4 times.  Draw an imaginary diagonal line from upper outer breast through the nipple area toward lower inner breast.  Direct fluid upward and inward from this line toward the pathway across your upper chest .  Do this in three rows to treat all of the upper inner breast tissue, and do each row 3-4x.      Direct fluid to treat all of lower outer breast tissue downward and outward toward      pathway that is aimed at the left  groin.  Finish by doing the pathways as described above going from your involved armpit to the same side groin and going across your upper chest from the involved shoulder to the uninvolved shoulder.  Repeat the steps above where you do circles in your left groin and both armpits. Copyright  VHI. All rights reserved.

## 2019-12-12 ENCOUNTER — Encounter (HOSPITAL_COMMUNITY): Payer: Self-pay | Admitting: Hematology

## 2020-02-22 ENCOUNTER — Other Ambulatory Visit (HOSPITAL_COMMUNITY): Payer: Self-pay | Admitting: Hematology

## 2020-02-22 DIAGNOSIS — Z170 Estrogen receptor positive status [ER+]: Secondary | ICD-10-CM

## 2020-02-29 ENCOUNTER — Telehealth: Payer: Self-pay | Admitting: Family Medicine

## 2020-02-29 DIAGNOSIS — R7303 Prediabetes: Secondary | ICD-10-CM

## 2020-02-29 DIAGNOSIS — E782 Mixed hyperlipidemia: Secondary | ICD-10-CM

## 2020-02-29 DIAGNOSIS — Z79899 Other long term (current) drug therapy: Secondary | ICD-10-CM

## 2020-02-29 NOTE — Telephone Encounter (Signed)
Last labs ordered by dr Nicki Reaper and not done June 2020 lipid, liver, bmp

## 2020-02-29 NOTE — Telephone Encounter (Signed)
Patient has appointment for 6 month follow up on 9/2 and needing labs done

## 2020-03-02 ENCOUNTER — Ambulatory Visit (HOSPITAL_COMMUNITY)
Admission: RE | Admit: 2020-03-02 | Discharge: 2020-03-02 | Disposition: A | Discharge status: Home / Self Care | Payer: Commercial Managed Care - PPO | Source: Ambulatory Visit | Attending: Hematology | Admitting: Hematology

## 2020-03-02 ENCOUNTER — Inpatient Hospital Stay (HOSPITAL_COMMUNITY): Payer: Commercial Managed Care - PPO

## 2020-03-02 ENCOUNTER — Other Ambulatory Visit: Payer: Self-pay

## 2020-03-02 ENCOUNTER — Encounter: Payer: Self-pay | Admitting: *Deleted

## 2020-03-02 DIAGNOSIS — Z13820 Encounter for screening for osteoporosis: Secondary | ICD-10-CM | POA: Insufficient documentation

## 2020-03-02 DIAGNOSIS — Z853 Personal history of malignant neoplasm of breast: Secondary | ICD-10-CM | POA: Insufficient documentation

## 2020-03-02 DIAGNOSIS — Z780 Asymptomatic menopausal state: Secondary | ICD-10-CM | POA: Insufficient documentation

## 2020-03-02 MED ORDER — SIMVASTATIN 40 MG PO TABS: ORAL_TABLET | ORAL | 0 refills | Status: DC

## 2020-03-02 NOTE — Telephone Encounter (Signed)
Lipid, liver, metabolic 7, urine ACR Hyperlipidemia, proteinuria

## 2020-03-02 NOTE — Telephone Encounter (Signed)
Labs ordered, message sent on mychart.

## 2020-03-05 ENCOUNTER — Other Ambulatory Visit: Payer: Self-pay

## 2020-03-05 ENCOUNTER — Inpatient Hospital Stay (HOSPITAL_COMMUNITY): Payer: Commercial Managed Care - PPO

## 2020-03-05 ENCOUNTER — Encounter (HOSPITAL_COMMUNITY): Payer: Self-pay | Admitting: Hematology

## 2020-03-05 ENCOUNTER — Inpatient Hospital Stay (HOSPITAL_COMMUNITY): Payer: Commercial Managed Care - PPO | Attending: Hematology | Admitting: Hematology

## 2020-03-05 VITALS — BP 114/51 | HR 88 | Temp 97.1°F | Resp 17 | Wt 151.9 lb

## 2020-03-05 DIAGNOSIS — C50412 Malignant neoplasm of upper-outer quadrant of left female breast: Secondary | ICD-10-CM | POA: Insufficient documentation

## 2020-03-05 DIAGNOSIS — Z79811 Long term (current) use of aromatase inhibitors: Secondary | ICD-10-CM | POA: Insufficient documentation

## 2020-03-05 DIAGNOSIS — M810 Age-related osteoporosis without current pathological fracture: Secondary | ICD-10-CM | POA: Insufficient documentation

## 2020-03-05 DIAGNOSIS — M818 Other osteoporosis without current pathological fracture: Secondary | ICD-10-CM | POA: Diagnosis not present

## 2020-03-05 DIAGNOSIS — Z170 Estrogen receptor positive status [ER+]: Secondary | ICD-10-CM | POA: Diagnosis not present

## 2020-03-05 DIAGNOSIS — Z85528 Personal history of other malignant neoplasm of kidney: Secondary | ICD-10-CM | POA: Insufficient documentation

## 2020-03-05 LAB — CBC WITH DIFFERENTIAL/PLATELET
Abs Immature Granulocytes: 0.01 10*3/uL (ref 0.00–0.07)
Basophils Absolute: 0.1 10*3/uL (ref 0.0–0.1)
Basophils Relative: 1 %
Eosinophils Absolute: 0.1 10*3/uL (ref 0.0–0.5)
Eosinophils Relative: 2 %
HCT: 38.8 % (ref 36.0–46.0)
Hemoglobin: 12.4 g/dL (ref 12.0–15.0)
Immature Granulocytes: 0 %
Lymphocytes Relative: 26 %
Lymphs Abs: 1.7 10*3/uL (ref 0.7–4.0)
MCH: 29.2 pg (ref 26.0–34.0)
MCHC: 32 g/dL (ref 30.0–36.0)
MCV: 91.3 fL (ref 80.0–100.0)
Monocytes Absolute: 0.7 10*3/uL (ref 0.1–1.0)
Monocytes Relative: 11 %
Neutro Abs: 3.9 10*3/uL (ref 1.7–7.7)
Neutrophils Relative %: 60 %
Platelets: 259 10*3/uL (ref 150–400)
RBC: 4.25 MIL/uL (ref 3.87–5.11)
RDW: 13.2 % (ref 11.5–15.5)
WBC: 6.6 10*3/uL (ref 4.0–10.5)
nRBC: 0 % (ref 0.0–0.2)

## 2020-03-05 LAB — COMPREHENSIVE METABOLIC PANEL
ALT: 14 U/L (ref 0–44)
AST: 15 U/L (ref 15–41)
Albumin: 4.3 g/dL (ref 3.5–5.0)
Alkaline Phosphatase: 67 U/L (ref 38–126)
Anion gap: 7 (ref 5–15)
BUN: <5 mg/dL — ABNORMAL LOW (ref 6–20)
CO2: 25 mmol/L (ref 22–32)
Calcium: 9.1 mg/dL (ref 8.9–10.3)
Chloride: 101 mmol/L (ref 98–111)
Creatinine, Ser: 0.51 mg/dL (ref 0.44–1.00)
GFR calc Af Amer: >60 mL/min (ref >60)
GFR calc non Af Amer: >60 mL/min (ref >60)
Glucose, Bld: 134 mg/dL — ABNORMAL HIGH (ref 70–99)
Potassium: 3.7 mmol/L (ref 3.5–5.1)
Sodium: 133 mmol/L — ABNORMAL LOW (ref 135–145)
Total Bilirubin: 0.7 mg/dL (ref 0.3–1.2)
Total Protein: 7.5 g/dL (ref 6.5–8.1)

## 2020-03-05 LAB — VITAMIN D 25 HYDROXY (VIT D DEFICIENCY, FRACTURES): Vit D, 25-Hydroxy: 25.6 ng/mL — ABNORMAL LOW (ref 30–100)

## 2020-03-05 NOTE — Progress Notes (Signed)
[facility] [address], [place] [address]   Patient Care Team: Kathyrn Drown, MD as PCP - General (Family Medicine) Donetta Potts, RN as Oncology Nurse Navigator (Oncology) Derek Jack, MD as Medical Oncologist (Oncology)  SUMMARY OF ONCOLOGIC HISTORY: Oncology History  Mass of upper outer quadrant of left breast  08/17/2019 Initial Diagnosis   Mass of upper outer quadrant of left breast   11/11/2019 Genetic Testing        Malignant neoplasm of upper-outer quadrant of left female breast ([place])  09/15/2019 Initial Diagnosis   Malignant neoplasm of upper-outer quadrant of left female breast ([place])   09/15/2019 Cancer Staging   Staging form: Breast, AJCC 8th Edition - Clinical stage from 09/15/2019: Stage IIA (cT2, cN1(f), cM0, G2, ER+, PR+, HER2-) - Signed by Derek Jack, MD on 11/28/2019     CHIEF COMPLIANT: left breast cancer   INTERVAL HISTORY: Monica Russell is a 59 y.o. female here today for follow up of her left breast cancer. Her last visit was on 11/28/2019.   Today she reports feeling well. She is tolerating the anastrozole well and only complains of stiffness in her joints, similar to what she experienced when she took a statin, which resolve once she starts moving; she continues taking the anastrozole daily and denies having any hot flashes. She is not taking vitamin D or calcium supplements. She goes to her dentist regularly.   REVIEW OF SYSTEMS:   Review of Systems  Constitutional: Negative for appetite change and fatigue.  Gastrointestinal: Positive for diarrhea (loose stools d/t IBS).  Endocrine: Negative for hot flashes.  Skin: Negative for rash.  Neurological: Positive for numbness (hands).  All other systems reviewed and are negative.   I have reviewed the past medical history, past surgical history, social history and family history with the patient and they are unchanged from previous note.   ALLERGIES:   is  allergic to penicillins.   MEDICATIONS:  Current Outpatient Medications  Medication Sig Dispense Refill  . albuterol (PROVENTIL) (2.5 MG/3ML) 0.083% nebulizer solution Use via neb q 4 hours prn wheezing (Patient not taking: Reported on 11/28/2019) 50 vial 2  . anastrozole (ARIMIDEX) 1 MG tablet TAKE 1 TABLET(1 MG) BY MOUTH DAILY 30 tablet 2  . simvastatin (ZOCOR) 40 MG tablet TAKE 1 TABLET BY MOUTH EVERY DAY FOR CHOLESTEROL 90 tablet 0  . SYMBICORT 80-4.5 MCG/ACT inhaler TAKE 2 PUFFS BY MOUTH TWICE A DAY (Patient not taking: No sig reported) 10.2 Inhaler 0   No current facility-administered medications for this visit.     PHYSICAL EXAMINATION: Performance status (ECOG): 0 - Asymptomatic  Vitals:   03/05/20 1407  BP: (!) 114/51  Pulse: 88  Resp: 17  Temp: (!) 97.1 F (36.2 C)  SpO2: 96%   Wt Readings from Last 3 Encounters:  03/05/20 151 lb 14.4 oz (68.9 kg)  11/28/19 152 lb (68.9 kg)  10/31/19 154 lb 5.2 oz (70 kg)   Physical Exam Vitals reviewed.  Constitutional:      Appearance: Normal appearance.  Cardiovascular:     Rate and Rhythm: Normal rate and regular rhythm.     Pulses: Normal pulses.     Heart sounds: Normal heart sounds.  Pulmonary:     Effort: Pulmonary effort is normal.     Breath sounds: Normal breath sounds.  Abdominal:     Palpations: Abdomen is soft. There is no mass.     Tenderness: There is no abdominal  tenderness.  Musculoskeletal:     Right lower leg: No edema.     Left lower leg: No edema.  Lymphadenopathy:     Cervical: No cervical adenopathy.     Upper Body:     Right upper body: No supraclavicular or axillary adenopathy.     Left upper body: No supraclavicular or axillary adenopathy.  Neurological:     General: No focal deficit present.     Mental Status: She is alert and oriented to person, place, and time.  Psychiatric:        Mood and Affect: Mood normal.        Behavior: Behavior normal.     Breast Exam Chaperone: Monica Antis, MD     LABORATORY DATA:  I have reviewed the data as listed CMP Latest Ref Rng & Units 03/05/2020 09/20/2019 08/23/2018  Glucose 70 - 99 mg/dL 134(H) - -  BUN 6 - 20 mg/dL <5(L) - -  Creatinine 0.44 - 1.00 mg/dL 0.51 0.60 0.62  Sodium 135 - 145 mmol/L 133(L) - -  Potassium 3.5 - 5.1 mmol/L 3.7 - -  Chloride 98 - 111 mmol/L 101 - -  CO2 22 - 32 mmol/L 25 - -  Calcium 8.9 - 10.3 mg/dL 9.1 - -  Total Protein 6.5 - 8.1 g/dL 7.5 - -  Total Bilirubin 0.3 - 1.2 mg/dL 0.7 - -  Alkaline Phos 38 - 126 U/L 67 - -  AST 15 - 41 U/L 15 - -  ALT 0 - 44 U/L 14 - -   No results found for: [id number] Lab Results  Component Value Date   WBC 6.6 03/05/2020   HGB 12.4 03/05/2020   HCT 38.8 03/05/2020   MCV 91.3 03/05/2020   PLT 259 03/05/2020   NEUTROABS 3.9 03/05/2020   Lab Results  Component Value Date   VD25OH 25 (L) 03/21/2013    ASSESSMENT:  1.  Stage II (PT2PN1) infiltrating lobular carcinoma of the left breast: -Left lumpectomy and sentinel lymph node biopsy on 10/31/2019 with 3.7 cm grade 2 infiltrating lobular carcinoma, margins negative, 1/3 lymph nodes positive, Ki-67 2%, ER/PR 100%. -Oncotype DX score was 9 with 9-year distant recurrence with tamoxifen/AI was 12%.  No benefit from adjuvant chemotherapy. -Anastrozole started on 11/28/2019. -She finished radiation therapy to the left breast.  2.  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  3.  Osteoporosis: -DEXA scan on March 03, 2019 shows T score -3.4.   PLAN:  1.  Stage II (PT2PN1) infiltrating lobular carcinoma of the left breast: -She is tolerating anastrozole reasonably well. -She has stiffness in her joints which gets better with activity.  Stiffness is predominantly in the hips.  This started 2 to 3 weeks ago.  Denies any major hot flashes. -I have reassured her that joint symptoms will resolve within a few months. -I will see her  back in 4 months for follow-up, labs and breast exam.  2.  Osteoporosis: -We reviewed results of the DEXA scan which showed osteoporosis. -I have recommended Prolia 60 mg every 6 months throughout the duration of her aromatase inhibitor.  We also discussed side effects including but not limited to hypocalcemia, and osteonecrosis of the jaw.  She does have frequent dental exams does not have any history of caries.  She gives [place] permission to proceed with it.  We will start her on as soon as we get authorization  from her insurance. -She was also instructed to take calcium 2-3 times a day. -We plan to repeat DEXA scan in 2 to 3 years.  3.  Vitamin D deficiency: -Vitamin D is 25.6.  I have recommended to start vitamin D 2000 units daily.   No orders of the defined types were placed in this encounter.  The patient has a good understanding of the overall plan. she agrees with it. she will call with any problems that may develop before the next visit here.    Derek Jack, MD [facility] [facility] [phone]   I, Monica Russell, am acting as a scribe for Dr. Sanda Linger.  I, Derek Jack MD, have reviewed the above documentation for accuracy and completeness, and I agree with the above.

## 2020-03-05 NOTE — Progress Notes (Signed)
PT is taking anastrazole as prescribed.  The only side effect she mentioned is joint stiffness after she is sitting or lying down.

## 2020-03-05 NOTE — Patient Instructions (Signed)
[place] at [facility] Discharge Instructions  You were seen today by Dr. Delton Coombes. He went over your recent results and scans; your bones are currently in the osteoporotic range. You will receive Prolia injections every 6 months for strengthening your bones. Start taking over the counter calcium supplements 500 mg twice to three times daily. Dr. Delton Coombes will see you back in 4 months for labs and follow up.   Thank you for choosing [place] at [facility] to provide your oncology and hematology care.  To afford each patient quality time with our provider, please arrive at least 15 minutes before your scheduled appointment time.   If you have a lab appointment with the [facility] please come in thru the Main Entrance and check in at the main information desk  You need to re-schedule your appointment should you arrive 10 or more minutes late.  We strive to give you quality time with our providers, and arriving late affects you and other patients whose appointments are after yours.  Also, if you no show three or more times for appointments you may be dismissed from the clinic at the providers discretion.     Again, thank you for choosing [facility].  Our hope is that these requests will decrease the amount of time that you wait before being seen by our physicians.       _____________________________________________________________  Should you have questions after your visit to [facility], please contact our office at [phone] between the hours of 8:00 a.m. and 4:30 p.m.  Voicemails left after 4:00 p.m. will not be returned until the following business day.  For prescription refill requests, have your pharmacy contact our office and allow 72 hours.    Cancer Center Support Programs:   > Cancer Support Group  2nd Tuesday of the month 1pm-2pm, Journey Room

## 2020-03-06 ENCOUNTER — Encounter (HOSPITAL_COMMUNITY): Payer: Self-pay

## 2020-03-06 NOTE — Progress Notes (Signed)
Informed patient that Dr. Delton Coombes is wanting patient to take calcium and vitamin D daily. Patient has Calcium plus Vitamin D 600/500 informed to take this twice daily.

## 2020-03-07 NOTE — Addendum Note (Signed)
Addended by: Donetta Potts on: 03/07/2020 05:14 PM   Modules accepted: Orders

## 2020-03-13 LAB — HEPATIC FUNCTION PANEL
ALT: 10 IU/L (ref 0–32)
AST: 16 IU/L (ref 0–40)
Albumin: 4.5 g/dL (ref 3.8–4.9)
Alkaline Phosphatase: 76 IU/L (ref 48–121)
Bilirubin Total: 0.3 mg/dL (ref 0.0–1.2)
Bilirubin, Direct: 0.09 mg/dL (ref 0.00–0.40)
Total Protein: 6.8 g/dL (ref 6.0–8.5)

## 2020-03-13 LAB — BASIC METABOLIC PANEL
BUN/Creatinine Ratio: 10 — ABNORMAL LOW (ref 12–28)
BUN: 6 mg/dL — ABNORMAL LOW (ref 8–27)
CO2: 24 mmol/L (ref 20–29)
Calcium: 9.5 mg/dL (ref 8.7–10.3)
Chloride: 99 mmol/L (ref 96–106)
Creatinine, Ser: 0.63 mg/dL (ref 0.57–1.00)
GFR calc Af Amer: 113 mL/min/{1.73_m2} (ref >59)
GFR calc non Af Amer: 98 mL/min/{1.73_m2} (ref >59)
Glucose: 98 mg/dL (ref 65–99)
Potassium: 4.2 mmol/L (ref 3.5–5.2)
Sodium: 135 mmol/L (ref 134–144)

## 2020-03-13 LAB — MICROALBUMIN / CREATININE URINE RATIO
Creatinine, Urine: 39.8 mg/dL
Microalb/Creat Ratio: 103 mg/g creat — ABNORMAL HIGH (ref 0–29)
Microalbumin, Urine: 41.1 ug/mL

## 2020-03-13 LAB — LIPID PANEL
Chol/HDL Ratio: 3.3 ratio (ref 0.0–4.4)
Cholesterol, Total: 186 mg/dL (ref 100–199)
HDL: 57 mg/dL (ref >39)
LDL Chol Calc (NIH): 111 mg/dL — ABNORMAL HIGH (ref 0–99)
Triglycerides: 98 mg/dL (ref 0–149)
VLDL Cholesterol Cal: 18 mg/dL (ref 5–40)

## 2020-03-14 ENCOUNTER — Inpatient Hospital Stay (HOSPITAL_COMMUNITY): Payer: Commercial Managed Care - PPO

## 2020-03-14 ENCOUNTER — Inpatient Hospital Stay (HOSPITAL_COMMUNITY): Payer: Commercial Managed Care - PPO | Attending: Hematology

## 2020-03-14 ENCOUNTER — Other Ambulatory Visit: Payer: Self-pay

## 2020-03-14 ENCOUNTER — Encounter (HOSPITAL_COMMUNITY): Payer: Self-pay

## 2020-03-14 VITALS — BP 131/78 | HR 73 | Temp 98.7°F | Resp 18

## 2020-03-14 DIAGNOSIS — M810 Age-related osteoporosis without current pathological fracture: Secondary | ICD-10-CM | POA: Insufficient documentation

## 2020-03-14 DIAGNOSIS — Z170 Estrogen receptor positive status [ER+]: Secondary | ICD-10-CM

## 2020-03-14 LAB — CBC WITH DIFFERENTIAL/PLATELET
Abs Immature Granulocytes: 0.02 10*3/uL (ref 0.00–0.07)
Basophils Absolute: 0.1 10*3/uL (ref 0.0–0.1)
Basophils Relative: 1 %
Eosinophils Absolute: 0.1 10*3/uL (ref 0.0–0.5)
Eosinophils Relative: 1 %
HCT: 36.5 % (ref 36.0–46.0)
Hemoglobin: 11.7 g/dL — ABNORMAL LOW (ref 12.0–15.0)
Immature Granulocytes: 0 %
Lymphocytes Relative: 32 %
Lymphs Abs: 2.2 10*3/uL (ref 0.7–4.0)
MCH: 29.2 pg (ref 26.0–34.0)
MCHC: 32.1 g/dL (ref 30.0–36.0)
MCV: 91 fL (ref 80.0–100.0)
Monocytes Absolute: 0.8 10*3/uL (ref 0.1–1.0)
Monocytes Relative: 11 %
Neutro Abs: 3.7 10*3/uL (ref 1.7–7.7)
Neutrophils Relative %: 55 %
Platelets: 274 10*3/uL (ref 150–400)
RBC: 4.01 MIL/uL (ref 3.87–5.11)
RDW: 12.9 % (ref 11.5–15.5)
WBC: 7 10*3/uL (ref 4.0–10.5)
nRBC: 0 % (ref 0.0–0.2)

## 2020-03-14 LAB — COMPREHENSIVE METABOLIC PANEL
ALT: 14 U/L (ref 0–44)
AST: 16 U/L (ref 15–41)
Albumin: 4 g/dL (ref 3.5–5.0)
Alkaline Phosphatase: 58 U/L (ref 38–126)
Anion gap: 8 (ref 5–15)
BUN: <5 mg/dL — ABNORMAL LOW (ref 6–20)
CO2: 25 mmol/L (ref 22–32)
Calcium: 9.1 mg/dL (ref 8.9–10.3)
Chloride: 101 mmol/L (ref 98–111)
Creatinine, Ser: 0.49 mg/dL (ref 0.44–1.00)
GFR calc Af Amer: >60 mL/min (ref >60)
GFR calc non Af Amer: >60 mL/min (ref >60)
Glucose, Bld: 107 mg/dL — ABNORMAL HIGH (ref 70–99)
Potassium: 3.7 mmol/L (ref 3.5–5.1)
Sodium: 134 mmol/L — ABNORMAL LOW (ref 135–145)
Total Bilirubin: 0.5 mg/dL (ref 0.3–1.2)
Total Protein: 7.2 g/dL (ref 6.5–8.1)

## 2020-03-14 MED ORDER — DENOSUMAB 60 MG/ML ~~LOC~~ SOSY
60.0000 mg | PREFILLED_SYRINGE | Freq: Once | SUBCUTANEOUS | Status: AC
  Administered 2020-03-14: 60 mg via SUBCUTANEOUS

## 2020-03-14 NOTE — Progress Notes (Signed)
Monica Russell presents today for injection per the provider's orders. Consent signed.  Prolia in right arm administration without incident; injection site WNL; see MAR for injection details.  Patient tolerated procedure well and without incident.  No questions or complaints noted at this time. Calcium 9.1.  Vital signs stable.  Pt is taking calcium with vitamin D supplements. Discharged ambulatory.

## 2020-03-14 NOTE — Patient Instructions (Signed)
[facility] at [facility]  Discharge Instructions:   _______________________________________________________________  Thank you for choosing [facility] at [facility] to provide your oncology and hematology care.  To afford each patient quality time with our providers, please arrive at least 15 minutes before your scheduled appointment.  You need to re-schedule your appointment if you arrive 10 or more minutes late.  We strive to give you quality time with our providers, and arriving late affects you and other patients whose appointments are after yours.  Also, if you no show three or more times for appointments you may be dismissed from the clinic.  Again, thank you for choosing [facility] at [facility]. Our hope is that these requests will allow you access to exceptional care and in a timely manner. _______________________________________________________________  If you have questions after your visit, please contact our office at [phone] between the hours of 8:30 a.m. and 5:00 p.m. Voicemails left after 4:30 p.m. will not be returned until the following business day. _______________________________________________________________  For prescription refill requests, have your pharmacy contact our office. _______________________________________________________________  Recommendations made by the consultant and any test results will be sent to your referring physician. _______________________________________________________________ 

## 2020-03-15 ENCOUNTER — Other Ambulatory Visit: Payer: Self-pay

## 2020-03-15 ENCOUNTER — Ambulatory Visit (INDEPENDENT_AMBULATORY_CARE_PROVIDER_SITE_OTHER): Payer: Commercial Managed Care - PPO | Admitting: Family Medicine

## 2020-03-15 ENCOUNTER — Encounter: Payer: Self-pay | Admitting: Family Medicine

## 2020-03-15 VITALS — BP 128/80 | HR 74 | Temp 97.8°F | Ht 67.0 in | Wt 153.8 lb

## 2020-03-15 DIAGNOSIS — E782 Mixed hyperlipidemia: Secondary | ICD-10-CM | POA: Diagnosis not present

## 2020-03-15 DIAGNOSIS — M810 Age-related osteoporosis without current pathological fracture: Secondary | ICD-10-CM

## 2020-03-15 NOTE — Progress Notes (Signed)
Subjective:    Patient ID: Monica Russell, female    DOB: [birth year], 59 y.o.   MRN: [id number]  Hyperlipidemia This is a chronic problem. Pertinent negatives include no chest pain or shortness of breath. There are no compliance problems.    Would like to discuss taking pill instead of prolia injection every 6 months for osteoporosis.  Patient states that she started on Prolia Her oncologist started her on this.  The patient states that she would rather be on a pill.  She denies any major setbacks.  She does have osteoporosis.  We reviewed over this.  She is under some stress taking care of her husband who has dementia but she does a very good job of managing things Review of Systems  Constitutional: Negative for activity change, fatigue and fever.  HENT: Negative for congestion and rhinorrhea.   Respiratory: Negative for cough, chest tightness and shortness of breath.   Cardiovascular: Negative for chest pain and leg swelling.  Gastrointestinal: Negative for abdominal pain and nausea.  Skin: Negative for color change.  Neurological: Negative for dizziness and headaches.  Psychiatric/Behavioral: Negative for agitation and behavioral problems.       Objective:   Physical Exam Vitals reviewed.  Constitutional:      General: She is not in acute distress. HENT:     Head: Normocephalic and atraumatic.  Eyes:     General:        Right eye: No discharge.        Left eye: No discharge.  Neck:     Trachea: No tracheal deviation.  Cardiovascular:     Rate and Rhythm: Normal rate and regular rhythm.     Heart sounds: Normal heart sounds. No murmur heard.   Pulmonary:     Effort: Pulmonary effort is normal. No respiratory distress.     Breath sounds: Normal breath sounds.  Lymphadenopathy:     Cervical: No cervical adenopathy.  Skin:    General: Skin is warm and dry.  Neurological:     Mental Status: She is alert.     Coordination: Coordination normal.  Psychiatric:         Behavior: Behavior normal.     Results for orders placed or performed in visit on 03/14/20  CBC with Differential/Platelet  Result Value Ref Range   WBC 7.0 4.0 - 10.5 K/uL   RBC 4.01 3.87 - 5.11 MIL/uL   Hemoglobin 11.7 (L) 12.0 - 15.0 g/dL   HCT 36.5 36 - 46 %   MCV 91.0 80.0 - 100.0 fL   MCH 29.2 26.0 - 34.0 pg   MCHC 32.1 30.0 - 36.0 g/dL   RDW 12.9 11.5 - 15.5 %   Platelets 274 150 - 400 K/uL   nRBC 0.0 0.0 - 0.2 %   Neutrophils Relative % 55 %   Neutro Abs 3.7 1.7 - 7.7 K/uL   Lymphocytes Relative 32 %   Lymphs Abs 2.2 0.7 - 4.0 K/uL   Monocytes Relative 11 %   Monocytes Absolute 0.8 0 - 1 K/uL   Eosinophils Relative 1 %   Eosinophils Absolute 0.1 0 - 0 K/uL   Basophils Relative 1 %   Basophils Absolute 0.1 0 - 0 K/uL   Immature Granulocytes 0 %   Abs Immature Granulocytes 0.02 0.00 - 0.07 K/uL  Comprehensive metabolic panel  Result Value Ref Range   Sodium 134 (L) 135 - 145 mmol/L   Potassium 3.7 3.5 - 5.1 mmol/L   Chloride  101 98 - 111 mmol/L   CO2 25 22 - 32 mmol/L   Glucose, Bld 107 (H) 70 - 99 mg/dL   BUN <5 (L) 6 - 20 mg/dL   Creatinine, Ser 0.49 0.44 - 1.00 mg/dL   Calcium 9.1 8.9 - 10.3 mg/dL   Total Protein 7.2 6.5 - 8.1 g/dL   Albumin 4.0 3.5 - 5.0 g/dL   AST 16 15 - 41 U/L   ALT 14 0 - 44 U/L   Alkaline Phosphatase 58 38 - 126 U/L   Total Bilirubin 0.5 0.3 - 1.2 mg/dL   GFR calc non Af Amer >60 >60 mL/min   GFR calc Af Amer >60 >60 mL/min   Anion gap 8 5 - 15   LDL 111       Assessment & Plan:  Hyperlipidemia-lab work LDL 111 continue medication watch diet stay active minimize portions  Osteoporosis on Prolia currently she is going to see what the cost of this is if the cost is exuberant she will switch over to a pill she will let us know if she would like for [place] to manage this.  Follow-up in 1 year follow-up sooner problems

## 2020-03-20 ENCOUNTER — Telehealth: Payer: Self-pay | Admitting: Family Medicine

## 2020-03-20 NOTE — Telephone Encounter (Signed)
No coupons in the office at this time. Pt contacted and informed that she could go online and try to find coupons. Pt verbalized understanding.

## 2020-03-20 NOTE — Telephone Encounter (Signed)
Kimmarie, [facility] is wanting to know if you were able to see we had any SYMBICORT 80-4.5 MCG/ACT inhaler coupons?  Pt call back [phone]

## 2020-04-02 ENCOUNTER — Other Ambulatory Visit: Payer: Self-pay | Admitting: *Deleted

## 2020-04-03 NOTE — Telephone Encounter (Signed)
Please verify with the patient that this medication is correct if so may have 6 refills

## 2020-04-04 ENCOUNTER — Other Ambulatory Visit (INDEPENDENT_AMBULATORY_CARE_PROVIDER_SITE_OTHER): Payer: Commercial Managed Care - PPO | Admitting: *Deleted

## 2020-04-04 ENCOUNTER — Other Ambulatory Visit: Payer: Self-pay

## 2020-04-04 DIAGNOSIS — Z23 Encounter for immunization: Secondary | ICD-10-CM | POA: Diagnosis not present

## 2020-04-04 MED ORDER — BUDESONIDE-FORMOTEROL FUMARATE 80-4.5 MCG/ACT IN AERO: INHALATION_SPRAY | RESPIRATORY_TRACT | 5 refills | Status: DC

## 2020-04-04 NOTE — Telephone Encounter (Signed)
Mychart message sent to patient.

## 2020-05-01 ENCOUNTER — Other Ambulatory Visit (HOSPITAL_COMMUNITY): Payer: Self-pay | Admitting: Hematology

## 2020-05-01 DIAGNOSIS — Z170 Estrogen receptor positive status [ER+]: Secondary | ICD-10-CM

## 2020-05-21 ENCOUNTER — Telehealth: Payer: Self-pay

## 2020-05-21 NOTE — Telephone Encounter (Signed)
Pt had dropped of a copy of her bill for Dr Nicki Reaper to see. Dr Nicki Reaper ask her to dropped off bill regarding her shot she received.  Pt call back [phone]

## 2020-05-29 ENCOUNTER — Other Ambulatory Visit: Payer: Self-pay | Admitting: Family Medicine

## 2020-05-31 NOTE — Telephone Encounter (Signed)
Please notify the patient I appreciate her dropping this off so I can look at it as I had asked her to do I am unfortunately shocked by the large amount of money that this is Unfortunately I have no control over this aspect but nonetheless I sympathize with her at the high cost (Please inquire with her if she needs for [place] to do anything in regards to all of this, also would she would like for these copies to be sent back to her or we can shred)

## 2020-07-16 ENCOUNTER — Inpatient Hospital Stay (HOSPITAL_COMMUNITY): Payer: Commercial Managed Care - PPO

## 2020-07-23 ENCOUNTER — Ambulatory Visit (HOSPITAL_COMMUNITY): Payer: Commercial Managed Care - PPO | Admitting: Hematology

## 2020-08-06 ENCOUNTER — Inpatient Hospital Stay (HOSPITAL_COMMUNITY): Payer: Commercial Managed Care - PPO | Attending: Hematology

## 2020-08-06 DIAGNOSIS — M810 Age-related osteoporosis without current pathological fracture: Secondary | ICD-10-CM | POA: Diagnosis not present

## 2020-08-06 DIAGNOSIS — Z170 Estrogen receptor positive status [ER+]: Secondary | ICD-10-CM | POA: Diagnosis not present

## 2020-08-06 DIAGNOSIS — C50412 Malignant neoplasm of upper-outer quadrant of left female breast: Secondary | ICD-10-CM | POA: Diagnosis present

## 2020-08-06 LAB — COMPREHENSIVE METABOLIC PANEL
ALT: 15 U/L (ref 0–44)
AST: 17 U/L (ref 15–41)
Albumin: 4 g/dL (ref 3.5–5.0)
Alkaline Phosphatase: 43 U/L (ref 38–126)
Anion gap: 6 (ref 5–15)
BUN: 7 mg/dL — ABNORMAL LOW (ref 8–23)
CO2: 28 mmol/L (ref 22–32)
Calcium: 9 mg/dL (ref 8.9–10.3)
Chloride: 101 mmol/L (ref 98–111)
Creatinine, Ser: 0.6 mg/dL (ref 0.44–1.00)
GFR, Estimated: >60 mL/min (ref >60)
Glucose, Bld: 115 mg/dL — ABNORMAL HIGH (ref 70–99)
Potassium: 3.6 mmol/L (ref 3.5–5.1)
Sodium: 135 mmol/L (ref 135–145)
Total Bilirubin: 0.7 mg/dL (ref 0.3–1.2)
Total Protein: 6.9 g/dL (ref 6.5–8.1)

## 2020-08-06 LAB — CBC WITH DIFFERENTIAL/PLATELET
Abs Immature Granulocytes: 0.01 10*3/uL (ref 0.00–0.07)
Basophils Absolute: 0.1 10*3/uL (ref 0.0–0.1)
Basophils Relative: 1 %
Eosinophils Absolute: 0.2 10*3/uL (ref 0.0–0.5)
Eosinophils Relative: 2 %
HCT: 37.6 % (ref 36.0–46.0)
Hemoglobin: 11.9 g/dL — ABNORMAL LOW (ref 12.0–15.0)
Immature Granulocytes: 0 %
Lymphocytes Relative: 26 %
Lymphs Abs: 1.9 10*3/uL (ref 0.7–4.0)
MCH: 29.2 pg (ref 26.0–34.0)
MCHC: 31.6 g/dL (ref 30.0–36.0)
MCV: 92.2 fL (ref 80.0–100.0)
Monocytes Absolute: 0.7 10*3/uL (ref 0.1–1.0)
Monocytes Relative: 9 %
Neutro Abs: 4.4 10*3/uL (ref 1.7–7.7)
Neutrophils Relative %: 62 %
Platelets: 247 10*3/uL (ref 150–400)
RBC: 4.08 MIL/uL (ref 3.87–5.11)
RDW: 13.2 % (ref 11.5–15.5)
WBC: 7.1 10*3/uL (ref 4.0–10.5)
nRBC: 0 % (ref 0.0–0.2)

## 2020-08-06 LAB — VITAMIN D 25 HYDROXY (VIT D DEFICIENCY, FRACTURES): Vit D, 25-Hydroxy: 25.2 ng/mL — ABNORMAL LOW (ref 30–100)

## 2020-08-14 ENCOUNTER — Other Ambulatory Visit: Payer: Self-pay

## 2020-08-14 ENCOUNTER — Other Ambulatory Visit (HOSPITAL_COMMUNITY): Payer: Commercial Managed Care - PPO

## 2020-08-14 ENCOUNTER — Inpatient Hospital Stay (HOSPITAL_COMMUNITY): Payer: Commercial Managed Care - PPO | Attending: Hematology | Admitting: Oncology

## 2020-08-14 VITALS — BP 132/80 | HR 75 | Temp 97.4°F | Resp 18 | Wt 154.3 lb

## 2020-08-14 DIAGNOSIS — C50412 Malignant neoplasm of upper-outer quadrant of left female breast: Secondary | ICD-10-CM | POA: Insufficient documentation

## 2020-08-14 DIAGNOSIS — Z9221 Personal history of antineoplastic chemotherapy: Secondary | ICD-10-CM | POA: Diagnosis not present

## 2020-08-14 DIAGNOSIS — Z85528 Personal history of other malignant neoplasm of kidney: Secondary | ICD-10-CM | POA: Insufficient documentation

## 2020-08-14 DIAGNOSIS — E559 Vitamin D deficiency, unspecified: Secondary | ICD-10-CM | POA: Diagnosis not present

## 2020-08-14 DIAGNOSIS — M818 Other osteoporosis without current pathological fracture: Secondary | ICD-10-CM | POA: Insufficient documentation

## 2020-08-14 DIAGNOSIS — Z170 Estrogen receptor positive status [ER+]: Secondary | ICD-10-CM | POA: Insufficient documentation

## 2020-08-14 DIAGNOSIS — Z923 Personal history of irradiation: Secondary | ICD-10-CM | POA: Diagnosis not present

## 2020-08-14 DIAGNOSIS — Z79811 Long term (current) use of aromatase inhibitors: Secondary | ICD-10-CM | POA: Insufficient documentation

## 2020-08-14 MED ORDER — ALENDRONATE SODIUM 70 MG PO TABS
70.0000 mg | ORAL_TABLET | ORAL | 1 refills | Status: DC
Start: 2020-08-14 — End: 2020-08-14

## 2020-08-14 MED ORDER — TAMOXIFEN CITRATE 20 MG PO TABS: 20.0000 mg | ORAL_TABLET | Freq: Every day | ORAL | 2 refills | Status: DC

## 2020-08-14 NOTE — Progress Notes (Signed)
[facility] [address], [place] [address]   Patient Care Team: Kathyrn Drown, MD as PCP - General (Family Medicine) Donetta Potts, RN as Oncology Nurse Navigator (Oncology) Derek Jack, MD as Medical Oncologist (Oncology)  SUMMARY OF ONCOLOGIC HISTORY: Oncology History  Mass of upper outer quadrant of left breast  08/17/2019 Initial Diagnosis   Mass of upper outer quadrant of left breast   11/11/2019 Genetic Testing        Malignant neoplasm of upper-outer quadrant of left female breast ([place])  09/15/2019 Initial Diagnosis   Malignant neoplasm of upper-outer quadrant of left female breast ([place])   09/15/2019 Cancer Staging   Staging form: Breast, AJCC 8th Edition - Clinical stage from 09/15/2019: Stage IIA (cT2, cN1(f), cM0, G2, ER+, PR+, HER2-) - Signed by Derek Jack, MD on 11/28/2019     CHIEF COMPLIANT: left breast cancer   INTERVAL HISTORY: Monica Russell is a 60 y.o. female here today for follow up of her left breast cancer. Her last visit was on 03/05/20.   Today Monica Russell reports feeling well. Monica Russell is tolerating the anastrozole well and only complains of stiffness in her joints, similar to what Monica Russell experienced when Monica Russell took a statin, which resolve once Monica Russell starts moving; Monica Russell continues taking the anastrozole daily and denies having any hot flashes.  Monica Russell reports starting on Prolia about 6 months ago and received a bill for over $3000.  Monica Russell does not want any more Prolia injections.  Monica Russell has been compliant with her calcium and vitamin D but admits to only taking it once per day.   REVIEW OF SYSTEMS:   Review of Systems  Musculoskeletal: Positive for arthralgias and myalgias.    I have reviewed the past medical history, past surgical history, social history and family history with the patient and they are unchanged from previous note.   ALLERGIES:   is allergic to penicillins.   MEDICATIONS:  Current Outpatient Medications   Medication Sig Dispense Refill  . tamoxifen (NOLVADEX) 20 MG tablet Take 1 tablet (20 mg total) by mouth daily. 120 tablet 2  . albuterol (PROVENTIL) (2.5 MG/3ML) 0.083% nebulizer solution Use via neb q 4 hours prn wheezing 50 vial 2  . anastrozole (ARIMIDEX) 1 MG tablet TAKE 1 TABLET(1 MG) BY MOUTH DAILY 30 tablet 2  . budesonide-formoterol (SYMBICORT) 80-4.5 MCG/ACT inhaler TAKE 2 PUFFS BY MOUTH TWICE A DAY 10.2 g 5  . simvastatin (ZOCOR) 40 MG tablet TAKE 1 TABLET BY MOUTH EVERY DAY FOR CHOLESTEROL 90 tablet 1   No current facility-administered medications for this visit.     PHYSICAL EXAMINATION: Performance status (ECOG): 0 - Asymptomatic  Vitals:   08/14/20 1106  BP: 132/80  Pulse: 75  Resp: 18  Temp: (!) 97.4 F (36.3 C)  SpO2: 97%   Wt Readings from Last 3 Encounters:  08/14/20 154 lb 4.8 oz (70 kg)  03/15/20 153 lb 12.8 oz (69.8 kg)  03/05/20 151 lb 14.4 oz (68.9 kg)   Physical Exam Vitals reviewed.  Constitutional:      Appearance: Normal appearance.  Cardiovascular:     Rate and Rhythm: Normal rate and regular rhythm.     Pulses: Normal pulses.     Heart sounds: Normal heart sounds.  Pulmonary:     Effort: Pulmonary effort is normal.     Breath sounds: Normal breath sounds.  Chest:  Breasts:     Right: No axillary adenopathy or supraclavicular adenopathy.  Left: No axillary adenopathy or supraclavicular adenopathy.      Comments: Radiation-induced skin changes to left breast.- Abdominal:     Palpations: Abdomen is soft. There is no mass.     Tenderness: There is no abdominal tenderness.  Musculoskeletal:     Right lower leg: No edema.     Left lower leg: No edema.  Lymphadenopathy:     Cervical: No cervical adenopathy.     Upper Body:     Right upper body: No supraclavicular or axillary adenopathy.     Left upper body: No supraclavicular or axillary adenopathy.  Neurological:     General: No focal deficit present.     Mental Status: Monica Russell is  alert and oriented to person, place, and time.  Psychiatric:        Mood and Affect: Mood normal.        Behavior: Behavior normal.     Breast Exam Chaperone:    LABORATORY DATA:  I have reviewed the data as listed CMP Latest Ref Rng & Units 08/06/2020 03/14/2020 03/12/2020  Glucose 70 - 99 mg/dL 115(H) 107(H) 98  BUN 8 - 23 mg/dL 7(L) <5(L) 6(L)  Creatinine 0.44 - 1.00 mg/dL 0.60 0.49 0.63  Sodium 135 - 145 mmol/L 135 134(L) 135  Potassium 3.5 - 5.1 mmol/L 3.6 3.7 4.2  Chloride 98 - 111 mmol/L 101 101 99  CO2 22 - 32 mmol/L 28 25 24   Calcium 8.9 - 10.3 mg/dL 9.0 9.1 9.5  Total Protein 6.5 - 8.1 g/dL 6.9 7.2 6.8  Total Bilirubin 0.3 - 1.2 mg/dL 0.7 0.5 0.3  Alkaline Phos 38 - 126 U/L 43 58 76  AST 15 - 41 U/L 17 16 16   ALT 0 - 44 U/L 15 14 10    No results found for: [id number] Lab Results  Component Value Date   WBC 7.1 08/06/2020   HGB 11.9 (L) 08/06/2020   HCT 37.6 08/06/2020   MCV 92.2 08/06/2020   PLT 247 08/06/2020   NEUTROABS 4.4 08/06/2020   Lab Results  Component Value Date   VD25OH 25.20 (L) 08/06/2020   VD25OH 25.60 (L) 03/05/2020   VD25OH 25 (L) 03/21/2013    ASSESSMENT:  1.  Stage II (PT2PN1) infiltrating lobular carcinoma of the left breast: -Left lumpectomy and sentinel lymph node biopsy on 10/31/2019 with 3.7 cm grade 2 infiltrating lobular carcinoma, margins negative, 1/3 lymph nodes positive, Ki-67 2%, ER/PR 100%. -Oncotype DX score was 9 with 9-year distant recurrence with tamoxifen/AI was 12%.  No benefit from adjuvant chemotherapy. -Anastrozole started on 11/28/2019. -Monica Russell finished radiation therapy to the left breast.  2.  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  3.  Osteoporosis: -DEXA scan on March 03, 2019 shows T score -3.4. -Monica Russell was started on Prolia which was given on 03/14/2020. -Monica Russell is not interested in any additional Prolia secondary to cost.   PLAN:   1.  Stage II (PT2PN1) infiltrating lobular carcinoma of the left breast: -Tolerated anastrozole fairly well except for joint stiffness predominantly in her hips. No major hot flashes. -We have asked her to stop anastrozole and switch to tamoxifen secondary to osteoporosis and cost associated with Prolia.  -We will have her follow-up in 4 to 6 weeks with lab work to assess blood counts, LFTs and calcium levels. -Monica Russell will then return in 4 months with repeat labs, mammogram and assessment.  2.  Osteoporosis: -DEXA scan  from 03/02/21 showed osteoporosis. -Started on Prolia and given first injection on 03/14/2020.  Unfortunately, it was way too expensive for her. We spoke about alternatives such as Fosamax but ultimately decided to switch from anastrozole to tamoxifen due to side effects profile of Fosamax. -We discussed side effects of tamoxifen which include hot flashes, flushing, hypertension, peripheral edema, skin changes and weight loss. -Tamoxifen in postmenopausal women is associated with a protective effect on bone mineral density preventing loss.  -Plan to repeat bone density in August 2023  3.  Vitamin D deficiency: -Vitamin D level continues to be low. -Recommend compliance with calcium and vitamin D. -Previously was given ergocalciferol 50,000 units weekly which Monica Russell was unable to tolerate.  Disposition: -Prescription sent for tamoxifen 20 mg daily. -Per monitoring parameters, Monica Russell will return to clinic in 4 to 6 weeks to repeat CBC, LFTs and calcium level. -RTC in 4 months with repeat labs (CBC, CMP, vitamin D), review mammogram and assessment.  Orders Placed This Encounter  Procedures  . MM DIAG BREAST TOMO BILATERAL    Standing Status:   Future    Standing Expiration Date:   08/14/2021    Order Specific Question:   Reason for Exam (SYMPTOM  OR DIAGNOSIS REQUIRED)    Answer:   f/u breast cancer-annual    Order Specific Question:   Preferred imaging location?    Answer:   [facility]   Greater than 50% was spent in counseling and coordination of care with this patient including but not limited to discussion of the relevant topics above (See A&P) including, but not limited to diagnosis and management of acute and chronic medical conditions.   Faythe Casa, NP 08/15/2020 3:35 PM  [place] [phone]

## 2020-08-21 ENCOUNTER — Other Ambulatory Visit (HOSPITAL_COMMUNITY): Payer: Self-pay | Admitting: Hematology

## 2020-08-21 DIAGNOSIS — Z170 Estrogen receptor positive status [ER+]: Secondary | ICD-10-CM

## 2020-09-26 ENCOUNTER — Inpatient Hospital Stay (HOSPITAL_COMMUNITY): Payer: Commercial Managed Care - PPO | Attending: Hematology | Admitting: Hematology

## 2020-09-26 ENCOUNTER — Other Ambulatory Visit: Payer: Self-pay

## 2020-09-26 DIAGNOSIS — Z170 Estrogen receptor positive status [ER+]: Secondary | ICD-10-CM | POA: Diagnosis not present

## 2020-09-26 DIAGNOSIS — C50912 Malignant neoplasm of unspecified site of left female breast: Secondary | ICD-10-CM

## 2020-09-26 NOTE — Progress Notes (Signed)
Virtual Visit via Telephone Note  I connected with Monica Russell on 09/26/20 at  4:00 PM EDT by telephone and verified that I am speaking with the correct person using two identifiers.  Location: Patient: At home Provider: In the office   I discussed the limitations, risks, security and privacy concerns of performing an evaluation and management service by telephone and the availability of in person appointments. I also discussed with the patient that there may be a patient responsible charge related to this service. The patient expressed understanding and agreed to proceed.   History of Present Illness: She is seen in our office for stage II infiltrating lobular carcinoma of the left breast, left lumpectomy and SLNB on 10/31/2019.  Oncotype DX was low at 9.  Anastrozole is started on 11/28/2019.  This was discontinued secondary to osteoporosis on 08/14/2020.  Tamoxifen was started at that time.   Observations/Objective: She is tolerating tamoxifen reasonably well.  She reported joint stiffness at nighttime since the start of tamoxifen.  She thought she gained a lot of weight but when we checked it, she gained only 1 or 2 pounds.  She is taking calcium with vitamin D twice daily.  Assessment and Plan:  1.  Stage II (PT2PN1) infiltrating lobular carcinoma the left breast: -Tamoxifen was started around 08/14/2020 as her DEXA scan from 03/02/2020 showed osteoporosis. -She received first dose of Prolia but the co-pay was prohibitive for the patient. -She is tolerating tamoxifen very well except some joint stiffness at nighttime which will likely improve over time.  She was told to call us if she has any vaginal spotting. -She will have her mammogram in April.  2.  Osteoporosis: -DEXA scan on 03/02/2020 with osteoporosis. -Vitamin D level was low at 25.  She started taking calcium 600 mg plus vitamin D 800 units twice daily.   Follow Up Instructions: RTC in May as previously scheduled.   I  discussed the assessment and treatment plan with the patient. The patient was provided an opportunity to ask questions and all were answered. The patient agreed with the plan and demonstrated an understanding of the instructions.   The patient was advised to call back or seek an in-person evaluation if the symptoms worsen or if the condition fails to improve as anticipated.  I provided 12 minutes of non-face-to-face time during this encounter.   Derek Jack, MD

## 2020-10-23 ENCOUNTER — Other Ambulatory Visit (HOSPITAL_COMMUNITY): Payer: Self-pay | Admitting: Oncology

## 2020-10-23 DIAGNOSIS — Z9889 Other specified postprocedural states: Secondary | ICD-10-CM

## 2020-11-05 ENCOUNTER — Other Ambulatory Visit (HOSPITAL_COMMUNITY): Payer: Self-pay

## 2020-11-05 DIAGNOSIS — Z170 Estrogen receptor positive status [ER+]: Secondary | ICD-10-CM

## 2020-11-05 DIAGNOSIS — M810 Age-related osteoporosis without current pathological fracture: Secondary | ICD-10-CM

## 2020-11-06 ENCOUNTER — Inpatient Hospital Stay (HOSPITAL_COMMUNITY): Payer: Commercial Managed Care - PPO | Attending: Hematology

## 2020-11-06 ENCOUNTER — Ambulatory Visit (HOSPITAL_COMMUNITY)
Admission: RE | Admit: 2020-11-06 | Discharge: 2020-11-06 | Disposition: A | Discharge status: Home / Self Care | Payer: Commercial Managed Care - PPO | Source: Ambulatory Visit | Attending: Oncology | Admitting: Oncology

## 2020-11-06 ENCOUNTER — Other Ambulatory Visit: Payer: Self-pay

## 2020-11-06 DIAGNOSIS — Z170 Estrogen receptor positive status [ER+]: Secondary | ICD-10-CM | POA: Diagnosis not present

## 2020-11-06 DIAGNOSIS — C50412 Malignant neoplasm of upper-outer quadrant of left female breast: Secondary | ICD-10-CM | POA: Insufficient documentation

## 2020-11-06 DIAGNOSIS — Z9889 Other specified postprocedural states: Secondary | ICD-10-CM

## 2020-11-06 DIAGNOSIS — Z79810 Long term (current) use of selective estrogen receptor modulators (SERMs): Secondary | ICD-10-CM | POA: Diagnosis not present

## 2020-11-06 DIAGNOSIS — M810 Age-related osteoporosis without current pathological fracture: Secondary | ICD-10-CM | POA: Insufficient documentation

## 2020-11-06 LAB — COMPREHENSIVE METABOLIC PANEL
ALT: 12 U/L (ref 0–44)
AST: 14 U/L — ABNORMAL LOW (ref 15–41)
Albumin: 3.8 g/dL (ref 3.5–5.0)
Alkaline Phosphatase: 37 U/L — ABNORMAL LOW (ref 38–126)
Anion gap: 7 (ref 5–15)
BUN: 5 mg/dL — ABNORMAL LOW (ref 8–23)
CO2: 25 mmol/L (ref 22–32)
Calcium: 9 mg/dL (ref 8.9–10.3)
Chloride: 101 mmol/L (ref 98–111)
Creatinine, Ser: 0.58 mg/dL (ref 0.44–1.00)
GFR, Estimated: >60 mL/min (ref >60)
Glucose, Bld: 111 mg/dL — ABNORMAL HIGH (ref 70–99)
Potassium: 4.3 mmol/L (ref 3.5–5.1)
Sodium: 133 mmol/L — ABNORMAL LOW (ref 135–145)
Total Bilirubin: 0.3 mg/dL (ref 0.3–1.2)
Total Protein: 6.9 g/dL (ref 6.5–8.1)

## 2020-11-06 LAB — CBC WITH DIFFERENTIAL/PLATELET
Abs Immature Granulocytes: 0.02 10*3/uL (ref 0.00–0.07)
Basophils Absolute: 0.1 10*3/uL (ref 0.0–0.1)
Basophils Relative: 1 %
Eosinophils Absolute: 0.1 10*3/uL (ref 0.0–0.5)
Eosinophils Relative: 2 %
HCT: 37.8 % (ref 36.0–46.0)
Hemoglobin: 12.1 g/dL (ref 12.0–15.0)
Immature Granulocytes: 0 %
Lymphocytes Relative: 27 %
Lymphs Abs: 1.8 10*3/uL (ref 0.7–4.0)
MCH: 29.4 pg (ref 26.0–34.0)
MCHC: 32 g/dL (ref 30.0–36.0)
MCV: 92 fL (ref 80.0–100.0)
Monocytes Absolute: 0.7 10*3/uL (ref 0.1–1.0)
Monocytes Relative: 11 %
Neutro Abs: 4 10*3/uL (ref 1.7–7.7)
Neutrophils Relative %: 59 %
Platelets: 277 10*3/uL (ref 150–400)
RBC: 4.11 MIL/uL (ref 3.87–5.11)
RDW: 12.4 % (ref 11.5–15.5)
WBC: 6.7 10*3/uL (ref 4.0–10.5)
nRBC: 0 % (ref 0.0–0.2)

## 2020-11-06 LAB — VITAMIN D 25 HYDROXY (VIT D DEFICIENCY, FRACTURES): Vit D, 25-Hydroxy: 27.18 ng/mL — ABNORMAL LOW (ref 30–100)

## 2020-11-21 ENCOUNTER — Other Ambulatory Visit: Payer: Self-pay

## 2020-11-21 ENCOUNTER — Inpatient Hospital Stay (HOSPITAL_COMMUNITY): Payer: Commercial Managed Care - PPO | Attending: Hematology | Admitting: Hematology

## 2020-11-21 VITALS — BP 143/73 | HR 71 | Temp 97.4°F | Resp 18 | Wt 154.9 lb

## 2020-11-21 DIAGNOSIS — Z79810 Long term (current) use of selective estrogen receptor modulators (SERMs): Secondary | ICD-10-CM | POA: Insufficient documentation

## 2020-11-21 DIAGNOSIS — Z85528 Personal history of other malignant neoplasm of kidney: Secondary | ICD-10-CM | POA: Insufficient documentation

## 2020-11-21 DIAGNOSIS — Z170 Estrogen receptor positive status [ER+]: Secondary | ICD-10-CM | POA: Insufficient documentation

## 2020-11-21 DIAGNOSIS — E559 Vitamin D deficiency, unspecified: Secondary | ICD-10-CM | POA: Insufficient documentation

## 2020-11-21 DIAGNOSIS — C50412 Malignant neoplasm of upper-outer quadrant of left female breast: Secondary | ICD-10-CM | POA: Insufficient documentation

## 2020-11-21 DIAGNOSIS — M810 Age-related osteoporosis without current pathological fracture: Secondary | ICD-10-CM | POA: Insufficient documentation

## 2020-11-21 MED ORDER — ALENDRONATE SODIUM 70 MG PO TABS: 70.0000 mg | ORAL_TABLET | ORAL | 3 refills | Status: DC

## 2020-11-21 NOTE — Patient Instructions (Signed)
[facility] at [facility] Discharge Instructions  You were seen today by Dr. Delton Coombes. He went over your recent results. You will be prescribed FOSAMAX to take weekly for your bones; after taking it remain upright for 3-5 hours. Take 2000 units of vitamin D daily. Dr. Delton Coombes will see you back in 4 months for labs and follow up.   Thank you for choosing [facility] at [facility] to provide your oncology and hematology care.  To afford each patient quality time with our provider, please arrive at least 15 minutes before your scheduled appointment time.   If you have a lab appointment with the [facility] please come in thru the Main Entrance and check in at the main information desk  You need to re-schedule your appointment should you arrive 10 or more minutes late.  We strive to give you quality time with our providers, and arriving late affects you and other patients whose appointments are after yours.  Also, if you no show three or more times for appointments you may be dismissed from the clinic at the providers discretion.     Again, thank you for choosing [facility].  Our hope is that these requests will decrease the amount of time that you wait before being seen by our physicians.       _____________________________________________________________  Should you have questions after your visit to [facility], please contact our office at [phone] between the hours of 8:00 a.m. and 4:30 p.m.  Voicemails left after 4:00 p.m. will not be returned until the following business day.  For prescription refill requests, have your pharmacy contact our office and allow 72 hours.    Cancer Center Support Programs:   > Cancer Support Group  2nd Tuesday of the month 1pm-2pm, Journey Room

## 2020-11-21 NOTE — Progress Notes (Signed)
[facility] [address], [place] [address]   Patient Care Team: Kathyrn Drown, MD as PCP - General (Family Medicine) Donetta Potts, RN as Oncology Nurse Navigator (Oncology) Derek Jack, MD as Medical Oncologist (Oncology)  SUMMARY OF ONCOLOGIC HISTORY: Oncology History  Mass of upper outer quadrant of left breast  08/17/2019 Initial Diagnosis   Mass of upper outer quadrant of left breast   11/11/2019 Genetic Testing        Malignant neoplasm of upper-outer quadrant of left female breast ([place])  09/15/2019 Initial Diagnosis   Malignant neoplasm of upper-outer quadrant of left female breast ([place])   09/15/2019 Cancer Staging   Staging form: Breast, AJCC 8th Edition - Clinical stage from 09/15/2019: Stage IIA (cT2, cN1(f), cM0, G2, ER+, PR+, HER2-) - Signed by Derek Jack, MD on 11/28/2019     CHIEF COMPLIANT: left breast cancer   INTERVAL HISTORY: Monica Russell is a 60 y.o. female here today for follow up of her left breast cancer. Her last visit was on 08/14/2020.   Today she reports feeling good. She is taking tamoxifen and tolerating it well. She reports mild occasional hot flashes, and denies vaginal spotting. She is currently taking vitamin D once in the morning and once at night.   REVIEW OF SYSTEMS:   Review of Systems  Constitutional: Negative for appetite change and fatigue.  Endocrine: Positive for hot flashes (occasional mild).  Genitourinary: Negative for vaginal bleeding.   Psychiatric/Behavioral: Positive for sleep disturbance.    I have reviewed the past medical history, past surgical history, social history and family history with the patient and they are unchanged from previous note.   ALLERGIES:   is allergic to penicillins.   MEDICATIONS:  Current Outpatient Medications  Medication Sig Dispense Refill  . alendronate (FOSAMAX) 70 MG tablet Take 1 tablet (70 mg total) by mouth once a week. Take with a full  glass of water on an empty stomach. 45 tablet 3  . anastrozole (ARIMIDEX) 1 MG tablet TAKE 1 TABLET(1 MG) BY MOUTH DAILY 30 tablet 2  . budesonide-formoterol (SYMBICORT) 80-4.5 MCG/ACT inhaler TAKE 2 PUFFS BY MOUTH TWICE A DAY 10.2 g 5  . simvastatin (ZOCOR) 40 MG tablet TAKE 1 TABLET BY MOUTH EVERY DAY FOR CHOLESTEROL 90 tablet 1  . tamoxifen (NOLVADEX) 20 MG tablet Take 1 tablet (20 mg total) by mouth daily. 120 tablet 2  . albuterol (PROVENTIL) (2.5 MG/3ML) 0.083% nebulizer solution Use via neb q 4 hours prn wheezing (Patient not taking: Reported on 11/21/2020) 50 vial 2   No current facility-administered medications for this visit.     PHYSICAL EXAMINATION: Performance status (ECOG): 0 - Asymptomatic  Vitals:   11/21/20 1103  BP: (!) 143/73  Pulse: 71  Resp: 18  Temp: (!) 97.4 F (36.3 C)  SpO2: 100%   Wt Readings from Last 3 Encounters:  11/21/20 154 lb 14.4 oz (70.3 kg)  08/14/20 154 lb 4.8 oz (70 kg)  03/15/20 153 lb 12.8 oz (69.8 kg)   Physical Exam Vitals reviewed.  Constitutional:      Appearance: Normal appearance.  Cardiovascular:     Rate and Rhythm: Normal rate and regular rhythm.     Pulses: Normal pulses.     Heart sounds: Normal heart sounds.  Pulmonary:     Effort: Pulmonary effort is normal.     Breath sounds: Normal breath sounds.  Chest:  Breasts:     Right: Swelling present. No  mass, nipple discharge, skin change or tenderness.     Left: No swelling, mass, nipple discharge, skin change (lumpectomy scar well healed) or tenderness.    Neurological:     General: No focal deficit present.     Mental Status: She is alert and oriented to person, place, and time.  Psychiatric:        Mood and Affect: Mood normal.        Behavior: Behavior normal.     Breast Exam Chaperone: Monica Russell     LABORATORY DATA:  I have reviewed the data as listed CMP Latest Ref Rng & Units 11/06/2020 08/06/2020 03/14/2020  Glucose 70 - 99 mg/dL 111(H) 115(H) 107(H)   BUN 8 - 23 mg/dL 5(L) 7(L) <5(L)  Creatinine 0.44 - 1.00 mg/dL 0.58 0.60 0.49  Sodium 135 - 145 mmol/L 133(L) 135 134(L)  Potassium 3.5 - 5.1 mmol/L 4.3 3.6 3.7  Chloride 98 - 111 mmol/L 101 101 101  CO2 22 - 32 mmol/L 25 28 25   Calcium 8.9 - 10.3 mg/dL 9.0 9.0 9.1  Total Protein 6.5 - 8.1 g/dL 6.9 6.9 7.2  Total Bilirubin 0.3 - 1.2 mg/dL 0.3 0.7 0.5  Alkaline Phos 38 - 126 U/L 37(L) 43 58  AST 15 - 41 U/L 14(L) 17 16  ALT 0 - 44 U/L 12 15 14    No results found for: OBS962 Lab Results  Component Value Date   WBC 6.7 11/06/2020   HGB 12.1 11/06/2020   HCT 37.8 11/06/2020   MCV 92.0 11/06/2020   PLT 277 11/06/2020   NEUTROABS 4.0 11/06/2020    ASSESSMENT:  1. Stage II (PT2PN1) infiltrating lobular carcinoma of the left breast: -Left lumpectomy and sentinel lymph node biopsy on 10/31/2019 with 3.7 cm grade 2 infiltrating lobular carcinoma, margins negative, 1/3 lymph nodes positive, Ki-67 2%, ER/PR 100%. -Oncotype DX score was 9 with 9-year distant recurrence with tamoxifen/AI was 12%. No benefit from adjuvant chemotherapy. -Anastrozole started on 11/28/2019. -She finished radiation therapy to the left breast. - Anastrozole changed to tamoxifen secondary to osteoporosis.  2. 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  3.  Osteoporosis: -DEXA scan on March 03, 2019 shows T score -3.4. -She was started on Prolia which was given on 03/14/2020. -She is not interested in any additional Prolia secondary to cost.   PLAN:  1. Stage II (PT2PN1) infiltrating lobular carcinoma of the left breast: -She is tolerating tamoxifen very well.  Anastrozole was changed to tamoxifen secondary to osteoporosis. - Physical examination today did not reveal any palpable masses or adenopathy. - Reviewed mammogram and ultrasound reports from 11/06/2020 which was BI-RADS Category 2. - Labs on 11/06/2020 were also  within normal limits with normal LFTs and CBC. - RTC 4 months for follow-up with repeat labs.  2.  Osteoporosis: -DEXA scan from 03/02/2021 showed osteoporosis. - She received 1 dose of Prolia on 03/14/2020.  She stopped it as it was very expensive. - We have switched her to tamoxifen at previous visit. - Today we talked about initiating her on Fosamax 70 mg weekly. - We discussed side effects including but not limited to esophageal ulceration, hypocalcemia, rare chance of ONJ. - We have sent a prescription to her pharmacy.  3.  Vitamin D deficiency: -Vitamin D level was 27 on 11/06/2020. - Previously could not tolerate 50,000 units weekly. - She will increase vitamin D to 2000 units daily.  Breast Cancer therapy associated bone loss: I have recommended calcium, Vitamin D and weight bearing exercises.  Orders placed this encounter:  No orders of the defined types were placed in this encounter.   The patient has a good understanding of the overall plan. She agrees with it. She will call with any problems that may develop before the next visit here.  Derek Jack, MD [place] [phone]   I, Monica Russell, am acting as a scribe for Dr. Sanda Linger.  I, Derek Jack MD, have reviewed the above documentation for accuracy and completeness, and I agree with the above.

## 2020-11-24 ENCOUNTER — Other Ambulatory Visit: Payer: Self-pay | Admitting: Family Medicine

## 2020-12-01 ENCOUNTER — Ambulatory Visit
Admission: EM | Admit: 2020-12-01 | Discharge: 2020-12-01 | Disposition: A | Discharge status: Home / Self Care | Payer: Commercial Managed Care - PPO | Attending: Emergency Medicine | Admitting: Emergency Medicine

## 2020-12-01 ENCOUNTER — Other Ambulatory Visit: Payer: Self-pay

## 2020-12-01 DIAGNOSIS — R059 Cough, unspecified: Secondary | ICD-10-CM

## 2020-12-01 DIAGNOSIS — R0981 Nasal congestion: Secondary | ICD-10-CM

## 2020-12-01 MED ORDER — DOXYCYCLINE HYCLATE 100 MG PO CAPS: 100.0000 mg | ORAL_CAPSULE | Freq: Two times a day (BID) | ORAL | 0 refills | Status: DC

## 2020-12-01 MED ORDER — BENZONATATE 100 MG PO CAPS: 100.0000 mg | ORAL_CAPSULE | Freq: Three times a day (TID) | ORAL | 0 refills | Status: DC

## 2020-12-01 NOTE — ED Triage Notes (Signed)
Pt presents with nasal congestion and headache with fever last night of 100.1

## 2020-12-01 NOTE — Discharge Instructions (Signed)
Get plenty of rest and push fluids Doxycycline for sinus infection Tessalon Perles prescribed for cough Use OTC zyrtec for nasal congestion, runny nose, and/or sore throat Use OTC flonase for nasal congestion and runny nose Use medications daily for symptom relief Use OTC medications like ibuprofen or tylenol as needed fever or pain Call or go to the ED if you have any new or worsening symptoms such as fever, worsening cough, shortness of breath, chest tightness, chest pain, turning blue, changes in mental status, etc...  

## 2020-12-01 NOTE — ED Provider Notes (Signed)
[place]   [id number] 12/01/20 Arrival Time: 0805   CC: COVID symptoms  SUBJECTIVE: History from: patient.  Adilen KATHYRN WARMUTH is a 60 y.o. female who presents with sinus congestion, sinus pain with yellow drainage, and cough x 1 week, ran fever last night of 100.1.  Denies sick exposure to COVID, flu or strep.  Has tried OTC medications without relief.  Denies aggravating factors.  Reports previous symptoms in the past with sinus infection.   Denies fever, chills, SOB, wheezing, chest pain, nausea, changes in bowel or bladder habits.    ROS: As per HPI.  All other pertinent ROS negative.     Past Medical History:  Diagnosis Date  . Breast cancer ([place])   . High cholesterol   . IBS (irritable bowel syndrome)   . Renal cell cancer [facility])    Past Surgical History:  Procedure Laterality Date  . BREAST LUMPECTOMY WITH RADIOACTIVE SEED AND SENTINEL LYMPH NODE BIOPSY Left 10/31/2019   Procedure: LEFT BREAST LUMPECTOMY X 2 WITH RADIOACTIVE SEED AND SENTINEL LYMPH NODE BIOPSY, LEFT AXILLARY SENTINEL NODE RADIOACTIVE SEED GUIDED EXCISION;  Surgeon: Rolm Bookbinder, MD;  Location: [place];  Service: General;  Laterality: Left;  . CHOLECYSTECTOMY    . COLONOSCOPY  04/28/2012   Procedure: COLONOSCOPY;  Surgeon: Rogene Houston, MD;  [facility];  Service: Endoscopy;  Laterality: N/A;  155  . PARTIAL COLECTOMY     for diverticulitis  . PARTIAL NEPHRECTOMY     11 yrs ago for a renal carcinoma, right   Allergies  Allergen Reactions  . Penicillins Hives   No current facility-administered medications on file prior to encounter.   Current Outpatient Medications on File Prior to Encounter  Medication Sig Dispense Refill  . albuterol (PROVENTIL) (2.5 MG/3ML) 0.083% nebulizer solution Use via neb q 4 hours prn wheezing (Patient not taking: Reported on 11/21/2020) 50 vial 2  . alendronate (FOSAMAX) 70 MG tablet Take 1 tablet (70 mg total) by mouth once a  week. Take with a full glass of water on an empty stomach. 45 tablet 3  . budesonide-formoterol (SYMBICORT) 80-4.5 MCG/ACT inhaler TAKE 2 PUFFS BY MOUTH TWICE A DAY 10.2 g 5  . Cholecalciferol (VITAMIN D3) 50 MCG (2000 UT) CAPS Take by mouth.    . simvastatin (ZOCOR) 40 MG tablet TAKE 1 TABLET BY MOUTH EVERY DAY FOR CHOLESTEROL 90 tablet 1  . tamoxifen (NOLVADEX) 20 MG tablet Take 1 tablet (20 mg total) by mouth daily. 120 tablet 2   Social History   Socioeconomic History  . Marital status: Married    Spouse name: Not on file  . Number of children: 2  . Years of education: Not on file  . Highest education level: Not on file  Occupational History  . Occupation: Retired  Tobacco Use  . Smoking status: Current Every Day Smoker    Packs/day: 0.50    Years: 40.00    Pack years: 20.00    Types: Cigarettes  . Smokeless tobacco: Never Used  . Tobacco comment: 10-12 a day  Vaping Use  . Vaping Use: Never used  Substance and Sexual Activity  . Alcohol use: No    Alcohol/week: 0.0 standard drinks    Comment: rarely  . Drug use: No  . Sexual activity: Yes    Birth control/protection: Post-menopausal  Other Topics Concern  . Not on file  Social History Narrative   Pt's husband has dementia   Social Determinants of Health  Financial Resource Strain: Not on file  Food Insecurity: Not on file  Transportation Needs: Not on file  Physical Activity: Not on file  Stress: Not on file  Social Connections: Not on file  Intimate Partner Violence: Not on file   Family History  Problem Relation Age of Onset  . Hypertension Mother   . Mental illness Mother   . Heart disease Maternal Grandmother   . Diabetes Maternal Grandmother   . Heart disease Maternal Grandfather   . Dementia Maternal Grandfather   . Mental illness Brother     OBJECTIVE:  Vitals:   12/01/20 0814  BP: 127/87  Pulse: (!) 108  Resp: 20  Temp: 99.3 F (37.4 C)  SpO2: 92%     General appearance: alert;  appears fatigued, but nontoxic; speaking in full sentences and tolerating own secretions HEENT: NCAT; Ears: EACs clear, TMs pearly gray; Eyes: PERRL.  EOM grossly intact. Nose: nares patent without rhinorrhea, Throat: oropharynx clear, tonsils non erythematous or enlarged, uvula midline  Neck: supple without LAD Lungs: unlabored respirations, symmetrical air entry; cough: mild; no respiratory distress; CTAB Heart: regular rate and rhythm.   Skin: warm and dry Psychological: alert and cooperative; normal mood and affect  ASSESSMENT & PLAN:  1. Sinus congestion   2. Cough     Meds ordered this encounter  Medications  . doxycycline (VIBRAMYCIN) 100 MG capsule    Sig: Take 1 capsule (100 mg total) by mouth 2 (two) times daily.    Dispense:  20 capsule    Refill:  0    Order Specific Question:   Supervising Provider    Answer:   Raylene Everts [[id number]]  . benzonatate (TESSALON) 100 MG capsule    Sig: Take 1 capsule (100 mg total) by mouth every 8 (eight) hours.    Dispense:  21 capsule    Refill:  0    Order Specific Question:   Supervising Provider    Answer:   Raylene Everts [[id number]]    Get plenty of rest and push fluids Doxycycline for sinus infection Tessalon Perles prescribed for cough Use OTC zyrtec for nasal congestion, runny nose, and/or sore throat Use OTC flonase for nasal congestion and runny nose Use medications daily for symptom relief Use OTC medications like ibuprofen or tylenol as needed fever or pain Call or go to the ED if you have any new or worsening symptoms such as fever, worsening cough, shortness of breath, chest tightness, chest pain, turning blue, changes in mental status, etc...   Reviewed expectations re: course of current medical issues. Questions answered. Outlined signs and symptoms indicating need for more acute intervention. Patient verbalized understanding. After Visit Summary given.         Lestine Box, PA-C 12/01/20  [phone]

## 2020-12-27 ENCOUNTER — Ambulatory Visit
Admission: EM | Admit: 2020-12-27 | Discharge: 2020-12-27 | Disposition: A | Discharge status: Home / Self Care | Payer: Commercial Managed Care - PPO | Attending: Emergency Medicine | Admitting: Emergency Medicine

## 2020-12-27 ENCOUNTER — Ambulatory Visit (INDEPENDENT_AMBULATORY_CARE_PROVIDER_SITE_OTHER): Payer: Commercial Managed Care - PPO

## 2020-12-27 ENCOUNTER — Encounter: Payer: Self-pay | Admitting: Emergency Medicine

## 2020-12-27 DIAGNOSIS — R059 Cough, unspecified: Secondary | ICD-10-CM | POA: Diagnosis not present

## 2020-12-27 DIAGNOSIS — R062 Wheezing: Secondary | ICD-10-CM

## 2020-12-27 DIAGNOSIS — J22 Unspecified acute lower respiratory infection: Secondary | ICD-10-CM

## 2020-12-27 MED ORDER — PREDNISONE 10 MG (21) PO TBPK: ORAL_TABLET | Freq: Every day | ORAL | 0 refills | Status: DC

## 2020-12-27 MED ORDER — LEVOFLOXACIN 750 MG PO TABS: 750.0000 mg | ORAL_TABLET | Freq: Every day | ORAL | 0 refills | Status: AC

## 2020-12-27 MED ORDER — DEXAMETHASONE SODIUM PHOSPHATE 10 MG/ML IJ SOLN
10.0000 mg | Freq: Once | INTRAMUSCULAR | Status: AC
  Administered 2020-12-27: 10 mg via INTRAMUSCULAR

## 2020-12-27 NOTE — ED Triage Notes (Signed)
Hx of recent sinus infection.  States her right side hurts and states she has been coughing a lot.  Productive cough from time to time.  States it hurts to take a deep breath with a burning sensation.

## 2020-12-27 NOTE — Discharge Instructions (Addendum)
X-rays negative for cardiopulmonary disease, but will cover for pneumonia given progressive symptoms Get plenty of rest and push fluids Prescribed levaquin and steroid Use OTC medication as needed for symptomatic relief Follow up with PCP for recheck and/or if symptoms persists Return or go to ER if you have any new or worsening symptoms such as fever, chills, fatigue, shortness of breath, wheezing, chest pain, nausea, changes in bowel or bladder habits, etc..Marland Kitchen

## 2020-12-27 NOTE — ED Provider Notes (Signed)
[place]   [id number] 12/27/20 Arrival Time: 1108  Cc: COUGH  SUBJECTIVE:  Monica Russell is a 60 y.o. female who presents with sinus congestion, wheezing, productive cough, and RT lower rib pain x 3 weeks.  Denies positive sick exposure or precipitating event.  Seen for similar symptoms on 5/21, and reports temporary improvement with doxycycline.  Symptoms are made worse at night.   Reports previous symptoms in the past that improved with steroid and levaquin.   Denies fever, chills, SOB, chest pain, nausea, changes in bowel or bladder habits.    ROS: As per HPI.  All other pertinent ROS negative.     Past Medical History:  Diagnosis Date   Breast cancer ([place])    High cholesterol    IBS (irritable bowel syndrome)    Renal cell cancer ([place])    Past Surgical History:  Procedure Laterality Date   BREAST LUMPECTOMY WITH RADIOACTIVE SEED AND SENTINEL LYMPH NODE BIOPSY Left 10/31/2019   Procedure: LEFT BREAST LUMPECTOMY X 2 WITH RADIOACTIVE SEED AND SENTINEL LYMPH NODE BIOPSY, LEFT AXILLARY SENTINEL NODE RADIOACTIVE SEED GUIDED EXCISION;  Surgeon: Rolm Bookbinder, MD;  Location: [place];  Service: General;  Laterality: Left;   CHOLECYSTECTOMY     COLONOSCOPY  04/28/2012   Procedure: COLONOSCOPY;  Surgeon: Rogene Houston, MD;  [facility];  Service: Endoscopy;  Laterality: N/A;  155   PARTIAL COLECTOMY     for diverticulitis   PARTIAL NEPHRECTOMY     11 yrs ago for a renal carcinoma, right   Allergies  Allergen Reactions   Penicillins Hives   No current facility-administered medications on file prior to encounter.   Current Outpatient Medications on File Prior to Encounter  Medication Sig Dispense Refill   albuterol (PROVENTIL) (2.5 MG/3ML) 0.083% nebulizer solution Use via neb q 4 hours prn wheezing (Patient not taking: Reported on 11/21/2020) 50 vial 2   alendronate (FOSAMAX) 70 MG tablet Take 1 tablet (70 mg total) by mouth once a  week. Take with a full glass of water on an empty stomach. 45 tablet 3   benzonatate (TESSALON) 100 MG capsule Take 1 capsule (100 mg total) by mouth every 8 (eight) hours. 21 capsule 0   budesonide-formoterol (SYMBICORT) 80-4.5 MCG/ACT inhaler TAKE 2 PUFFS BY MOUTH TWICE A DAY 10.2 g 5   Cholecalciferol (VITAMIN D3) 50 MCG (2000 UT) CAPS Take by mouth.     simvastatin (ZOCOR) 40 MG tablet TAKE 1 TABLET BY MOUTH EVERY DAY FOR CHOLESTEROL 90 tablet 1   tamoxifen (NOLVADEX) 20 MG tablet Take 1 tablet (20 mg total) by mouth daily. 120 tablet 2    Social History   Socioeconomic History   Marital status: Married    Spouse name: Not on file   Number of children: 2   Years of education: Not on file   Highest education level: Not on file  Occupational History   Occupation: Retired  Tobacco Use   Smoking status: Every Day    Packs/day: 0.50    Years: 40.00    Pack years: 20.00    Types: Cigarettes   Smokeless tobacco: Never   Tobacco comments:    10-12 a day  Vaping Use   Vaping Use: Never used  Substance and Sexual Activity   Alcohol use: No    Alcohol/week: 0.0 standard drinks    Comment: rarely   Drug use: No   Sexual activity: Yes    Birth control/protection: Post-menopausal  Other Topics Concern   Not on file  Social History Narrative   Pt's husband has dementia   Social Determinants of Health   Financial Resource Strain: Not on file  Food Insecurity: Not on file  Transportation Needs: Not on file  Physical Activity: Not on file  Stress: Not on file  Social Connections: Not on file  Intimate Partner Violence: Not on file   Family History  Problem Relation Age of Onset   Hypertension Mother    Mental illness Mother    Heart disease Maternal Grandmother    Diabetes Maternal Grandmother    Heart disease Maternal Grandfather    Dementia Maternal Grandfather    Mental illness Brother      OBJECTIVE:  Vitals:   12/27/20 1119  BP: 132/77  Pulse: 79  Resp: 16   Temp: 98 F (36.7 C)  TempSrc: Oral  SpO2: 97%    General appearance: Alert, appears fatigued, but nontoxic; speaking in full sentences without difficulty HEENT:NCAT; Ears: EACs clear, TMs pearly gray; Eyes: PERRL.  EOM grossly intact. Nose: nares patent without rhinorrhea; Throat: tonsils nonerythematous or enlarged, uvula midline  Neck: supple without LAD Lungs: scattered wheezes; normal respiratory effort; mild cough present Heart: regular rate and rhythm.   Skin: warm and dry Psychological: alert and cooperative; normal mood and affect  DIAGNOSTIC STUDIES:  DG Chest 2 View  Result Date: 12/27/2020 CLINICAL DATA:  Productive cough. EXAM: CHEST - 2 VIEW COMPARISON:  PA and lateral chest 03/14/2011. FINDINGS: The chest is hyperexpanded with attenuation of the pulmonary vasculature. No consolidative process, pneumothorax or effusion. Aortic atherosclerosis noted. Heart size is normal. No acute or focal bony abnormality. Surgical clips are seen in the left breast. IMPRESSION: No acute disease. Aortic Atherosclerosis (ICD10-I70.0) and Emphysema (ICD10-J43.9). Electronically Signed   By: Inge Rise M.D.   On: 12/27/2020 12:15     X-rays negative for cardiopulmonary disease  I have reviewed the x-rays myself and the radiologist interpretation. I am in agreement with the radiologist interpretation.    ASSESSMENT & PLAN:  1. Cough   2. Lower respiratory tract infection   3. Wheezing     Meds ordered this encounter  Medications   levofloxacin (LEVAQUIN) 750 MG tablet    Sig: Take 1 tablet (750 mg total) by mouth daily for 5 days.    Dispense:  5 tablet    Refill:  0    Order Specific Question:   Supervising Provider    Answer:   Raylene Everts [[id number]]   predniSONE (STERAPRED UNI-PAK 21 TAB) 10 MG (21) TBPK tablet    Sig: Take by mouth daily. Take 6 tabs by mouth daily  for 2 days, then 5 tabs for 2 days, then 4 tabs for 2 days, then 3 tabs for 2 days, 2 tabs for 2 days,  then 1 tab by mouth daily for 2 days    Dispense:  42 tablet    Refill:  0    Order Specific Question:   Supervising Provider    Answer:   Raylene Everts [[id number]]   dexamethasone (DECADRON) injection 10 mg     Orders Placed This Encounter  Procedures   DG Chest 2 View    Standing Status:   Standing    Number of Occurrences:   1    Order Specific Question:   Reason for Exam (SYMPTOM  OR DIAGNOSIS REQUIRED)    Answer:   persistent productive cough, and RT lower rib pain/ burning  sensation    X-rays negative for ACUTE cardiopulmonary disease, but will cover for pneumonia given progressive symptoms Get plenty of rest and push fluids Prescribed levaquin and steroid Use OTC medication as needed for symptomatic relief Follow up with PCP for recheck and/or if symptoms persists Return or go to ER if you have any new or worsening symptoms such as fever, chills, fatigue, shortness of breath, wheezing, chest pain, nausea, changes in bowel or bladder habits, etc...  Reviewed expectations re: course of current medical issues. Questions answered. Outlined signs and symptoms indicating need for more acute intervention. Patient verbalized understanding. After Visit Summary given.           Lestine Box, PA-C 12/27/20 1223

## 2021-01-04 ENCOUNTER — Ambulatory Visit
Admission: RE | Admit: 2021-01-04 | Discharge: 2021-01-04 | Disposition: A | Discharge status: Home / Self Care | Payer: Commercial Managed Care - PPO | Source: Ambulatory Visit | Attending: Emergency Medicine | Admitting: Emergency Medicine

## 2021-01-04 ENCOUNTER — Telehealth: Payer: Commercial Managed Care - PPO | Admitting: Emergency Medicine

## 2021-01-04 ENCOUNTER — Other Ambulatory Visit: Payer: Self-pay

## 2021-01-04 VITALS — BP 105/69 | HR 96 | Temp 99.0°F | Resp 19

## 2021-01-04 DIAGNOSIS — R059 Cough, unspecified: Secondary | ICD-10-CM | POA: Diagnosis not present

## 2021-01-04 DIAGNOSIS — R062 Wheezing: Secondary | ICD-10-CM

## 2021-01-04 DIAGNOSIS — R0689 Other abnormalities of breathing: Secondary | ICD-10-CM

## 2021-01-04 MED ORDER — DEXAMETHASONE SODIUM PHOSPHATE 10 MG/ML IJ SOLN
10.0000 mg | Freq: Once | INTRAMUSCULAR | Status: AC
  Administered 2021-01-04: 10 mg via INTRAMUSCULAR

## 2021-01-04 MED ORDER — NEBULIZER DEVI: 0 refills | Status: DC

## 2021-01-04 MED ORDER — ALBUTEROL SULFATE (2.5 MG/3ML) 0.083% IN NEBU: 2.5000 mg | INHALATION_SOLUTION | Freq: Four times a day (QID) | RESPIRATORY_TRACT | 12 refills | Status: AC | PRN

## 2021-01-04 MED ORDER — NEBULIZER/TUBING/MOUTHPIECE KIT: PACK | 0 refills | Status: DC

## 2021-01-04 NOTE — ED Provider Notes (Signed)
[place]   [id number] 01/04/21 Arrival Time: 2122  Cc: COUGH  SUBJECTIVE:  Monica Russell is a 60 y.o. female who presents with persistent nonproductive cough, wheezing, congestion, and reporting she is unable to get "good deep breath" x 1 month. Seen for similar on 6/16 and dx'ed with lower respiratory tract infection.  CXR negative, treated with steroid shot, prednisone and levofloxaxin with minimal relief.  Also reports stopping smoking within the past 5 days.  Symptoms are made worse at night.  Denies previous symptoms in the past.   Denies fever, chills, chest pain, nausea, changes in bowel or bladder habits.    ROS: As per HPI.  All other pertinent ROS negative.     Past Medical History:  Diagnosis Date   Breast cancer ([place])    High cholesterol    IBS (irritable bowel syndrome)    Renal cell cancer ([place])    Past Surgical History:  Procedure Laterality Date   BREAST LUMPECTOMY WITH RADIOACTIVE SEED AND SENTINEL LYMPH NODE BIOPSY Left 10/31/2019   Procedure: LEFT BREAST LUMPECTOMY X 2 WITH RADIOACTIVE SEED AND SENTINEL LYMPH NODE BIOPSY, LEFT AXILLARY SENTINEL NODE RADIOACTIVE SEED GUIDED EXCISION;  Surgeon: Rolm Bookbinder, MD;  Location: [place];  Service: General;  Laterality: Left;   CHOLECYSTECTOMY     COLONOSCOPY  04/28/2012   Procedure: COLONOSCOPY;  Surgeon: Rogene Houston, MD;  [facility];  Service: Endoscopy;  Laterality: N/A;  155   PARTIAL COLECTOMY     for diverticulitis   PARTIAL NEPHRECTOMY     11 yrs ago for a renal carcinoma, right   Allergies  Allergen Reactions   Penicillins Hives   No current facility-administered medications on file prior to encounter.   Current Outpatient Medications on File Prior to Encounter  Medication Sig Dispense Refill   alendronate (FOSAMAX) 70 MG tablet Take 1 tablet (70 mg total) by mouth once a week. Take with a full glass of water on an empty stomach. 45 tablet 3    benzonatate (TESSALON) 100 MG capsule Take 1 capsule (100 mg total) by mouth every 8 (eight) hours. 21 capsule 0   budesonide-formoterol (SYMBICORT) 80-4.5 MCG/ACT inhaler TAKE 2 PUFFS BY MOUTH TWICE A DAY 10.2 g 5   Cholecalciferol (VITAMIN D3) 50 MCG (2000 UT) CAPS Take by mouth.     predniSONE (STERAPRED UNI-PAK 21 TAB) 10 MG (21) TBPK tablet Take by mouth daily. Take 6 tabs by mouth daily  for 2 days, then 5 tabs for 2 days, then 4 tabs for 2 days, then 3 tabs for 2 days, 2 tabs for 2 days, then 1 tab by mouth daily for 2 days 42 tablet 0   simvastatin (ZOCOR) 40 MG tablet TAKE 1 TABLET BY MOUTH EVERY DAY FOR CHOLESTEROL 90 tablet 1   tamoxifen (NOLVADEX) 20 MG tablet Take 1 tablet (20 mg total) by mouth daily. 120 tablet 2    Social History   Socioeconomic History   Marital status: Married    Spouse name: Not on file   Number of children: 2   Years of education: Not on file   Highest education level: Not on file  Occupational History   Occupation: Retired  Tobacco Use   Smoking status: Every Day    Packs/day: 0.50    Years: 40.00    Pack years: 20.00    Types: Cigarettes   Smokeless tobacco: Never   Tobacco comments:    10-12 a day  Vaping  Use   Vaping Use: Never used  Substance and Sexual Activity   Alcohol use: No    Alcohol/week: 0.0 standard drinks    Comment: rarely   Drug use: No   Sexual activity: Yes    Birth control/protection: Post-menopausal  Other Topics Concern   Not on file  Social History Narrative   Pt's husband has dementia   Social Determinants of Radio broadcast assistant Strain: Not on file  Food Insecurity: Not on file  Transportation Needs: Not on file  Physical Activity: Not on file  Stress: Not on file  Social Connections: Not on file  Intimate Partner Violence: Not on file   Family History  Problem Relation Age of Onset   Hypertension Mother    Mental illness Mother    Heart disease Maternal Grandmother    Diabetes Maternal  Grandmother    Heart disease Maternal Grandfather    Dementia Maternal Grandfather    Mental illness Brother      OBJECTIVE:  Vitals:   01/04/21 1513 01/04/21 1516  BP: 105/69 105/69  Pulse: 91 96  Resp: 19 19  Temp: 99 F (37.2 C)   TempSrc: Oral Oral  SpO2: 91%      General appearance: alert; well-appearing, nontoxic; speaking in full sentences and tolerating own secretions HEENT: NCAT; Ears: EACs clear, TMs pearly gray; Eyes: PERRL.  EOM grossly intact.Nose: nares patent without rhinorrhea, Throat: oropharynx clear, tonsils non erythematous or enlarged, uvula midline  Neck: supple without LAD Lungs: unlabored respirations, symmetrical air entry; cough: absent; no respiratory distress; subtle wheezes throughout Heart: regular rate and rhythm.  Skin: warm and dry Psychological: alert and cooperative; normal mood and affect    ASSESSMENT & PLAN:  1. Cough   2. Wheezing     Meds ordered this encounter  Medications   Respiratory Therapy Supplies (NEBULIZER/TUBING/MOUTHPIECE) KIT    Sig: Use as directed    Dispense:  1 kit    Refill:  0    Order Specific Question:   Supervising Provider    Answer:   Raylene Everts [[id number]]   Respiratory Therapy Supplies (NEBULIZER) DEVI    Sig: Use as directed    Dispense:  1 each    Refill:  0    Order Specific Question:   Supervising Provider    Answer:   Raylene Everts [[id number]]   albuterol (PROVENTIL) (2.5 MG/3ML) 0.083% nebulizer solution    Sig: Take 3 mLs (2.5 mg total) by nebulization every 6 (six) hours as needed for wheezing or shortness of breath.    Dispense:  75 mL    Refill:  12    Order Specific Question:   Supervising Provider    Answer:   Raylene Everts [[id number]]   dexamethasone (DECADRON) injection 10 mg    No orders of the defined types were placed in this encounter.    Get plenty of rest and push fluids Steroid shot given in office Nebulizer and solution prescribed.  Use as directed Follow  up with PCP for recheck and/or if symptoms persists Return or go to ER if you have any new or worsening symptoms such as fever, chills, fatigue, shortness of breath, wheezing, chest pain, nausea, changes in bowel or bladder habits, etc...  Reviewed expectations re: course of current medical issues. Questions answered. Outlined signs and symptoms indicating need for more acute intervention. Patient verbalized understanding. After Visit Summary given.           [place], [place],  PA-C 01/04/21 1554

## 2021-01-04 NOTE — ED Triage Notes (Addendum)
Pt seen here on 12/27/2020.  Pt is still having a non productive cough and shortness of breath.   Pt feels like some neb 's tx's would help.

## 2021-01-04 NOTE — Discharge Instructions (Addendum)
Get plenty of rest and push fluids Steroid shot given in office Nebulizer and solution prescribed.  Use as directed Follow up with PCP for recheck and/or if symptoms persists Return or go to ER if you have any new or worsening symptoms such as fever, chills, fatigue, shortness of breath, wheezing, chest pain, nausea, changes in bowel or bladder habits, etc..Marland Kitchen

## 2021-01-04 NOTE — Progress Notes (Signed)
Based on what you shared with me, including persistent cough with trouble breathing despite strong antibiotics and prednisone, I feel your condition warrants further evaluation and I recommend that you be seen in a face to face visit.  You may also need a referral to a pulmonologist.     NOTE: There will be NO CHARGE for this eVisit   If you are having a true medical emergency please call 911.      For an urgent face to face visit, [facility] has six urgent care centers for your convenience:     [facility] [place] Get Driving Directions [phone] [place], [place] [address]    [facility]) Get Driving Directions [phone] [place], [place] [address]  [facility] ([place]) Get Driving Directions [phone] [address] [place],  [place]  [address]  [facility] Get Driving Directions [phone] [address] [place], [place], [place] [address]   [facility] Get Driving Directions  [phone] [address] [place], [place] [address]   [facility] [place] Get Driving Directions [phone] [address], [place], [place] [address]  Your MyChart E-visit questionnaire answers were reviewed by a board certified advanced clinical practitioner to complete your personal care plan based on your specific symptoms.  Thank you for using e-Visits.   Approximately 5 minutes was spent documenting and reviewing patient's chart.

## 2021-01-08 ENCOUNTER — Encounter (HOSPITAL_COMMUNITY): Payer: Self-pay

## 2021-01-08 ENCOUNTER — Other Ambulatory Visit: Payer: Self-pay

## 2021-01-08 ENCOUNTER — Ambulatory Visit (INDEPENDENT_AMBULATORY_CARE_PROVIDER_SITE_OTHER): Payer: Commercial Managed Care - PPO | Admitting: Family Medicine

## 2021-01-08 VITALS — BP 112/67 | HR 78 | Temp 97.2°F | Ht 67.0 in | Wt 152.4 lb

## 2021-01-08 DIAGNOSIS — E782 Mixed hyperlipidemia: Secondary | ICD-10-CM

## 2021-01-08 DIAGNOSIS — R300 Dysuria: Secondary | ICD-10-CM

## 2021-01-08 DIAGNOSIS — R809 Proteinuria, unspecified: Secondary | ICD-10-CM | POA: Diagnosis not present

## 2021-01-08 DIAGNOSIS — R7303 Prediabetes: Secondary | ICD-10-CM

## 2021-01-08 LAB — POCT URINALYSIS DIPSTICK
Spec Grav, UA: 1.01 (ref 1.010–1.025)
pH, UA: 6 (ref 5.0–8.0)

## 2021-01-08 MED ORDER — PANTOPRAZOLE SODIUM 40 MG PO TBEC: 40.0000 mg | DELAYED_RELEASE_TABLET | Freq: Every day | ORAL | 1 refills | Status: DC

## 2021-01-08 MED ORDER — PANTOPRAZOLE SODIUM 40 MG PO TBEC: 40.0000 mg | DELAYED_RELEASE_TABLET | Freq: Every day | ORAL | 0 refills | Status: DC

## 2021-01-08 NOTE — Progress Notes (Signed)
   Subjective:    Patient ID: Monica Russell, female    DOB: 12-22-1960, 60 y.o.   MRN: [id number]  HPI  Patient arrives for a follow up from a recent urgent care visit for cough and wheezing. Patient has had several visits to urgent care in last month for illness.  Patient with repetitive respiratory illnesses over the course of the past several weeks please see multiple urgent care or ER notes.  Patient does have underlying emphysema issues with history of smoking she has quit recently Patient states she is also having dysuria and urinary frequency. Review of Systems     Objective:   Physical Exam  General-in no acute distress Eyes-no discharge Lungs-respiratory rate normal, CTA CV-no murmurs,RRR Extremities skin warm dry no edema Neuro grossly normal Behavior normal, alert Abd- epigastric tenderness      Assessment & Plan:  1. Dysuria I do not find evidence of urinary infection currently urine culture ordered. - POCT urinalysis dipstick - Urine Culture - Microalbumin / creatinine urine ratio - Lipid panel  2. Mixed hyperlipidemia Lipid profile ordered await the results of this.  Patient has history of hyperlipidemia  3. Prediabetes Has history of prediabetes also to has a history of micro proteinuria check urine ACR - Microalbumin / creatinine urine ratio If patient does not see dramatic improvement in her chest congestion and coughing neck step would be pulmonary function testing If ongoing coughing and congestion also would recommend a CT scan I believe it is in the patient's best interest to follow-up in 4 to 6 weeks She uses Symbicort on a regular basis during the peak times of her troubles which is the spring pollen season as well as the fall time

## 2021-01-11 LAB — SPECIMEN STATUS REPORT

## 2021-01-11 LAB — URINE CULTURE

## 2021-03-25 ENCOUNTER — Inpatient Hospital Stay (HOSPITAL_COMMUNITY): Payer: Commercial Managed Care - PPO | Attending: Hematology

## 2021-03-25 ENCOUNTER — Other Ambulatory Visit: Payer: Self-pay

## 2021-03-25 DIAGNOSIS — M810 Age-related osteoporosis without current pathological fracture: Secondary | ICD-10-CM | POA: Diagnosis not present

## 2021-03-25 DIAGNOSIS — C50412 Malignant neoplasm of upper-outer quadrant of left female breast: Secondary | ICD-10-CM | POA: Insufficient documentation

## 2021-03-25 DIAGNOSIS — Z170 Estrogen receptor positive status [ER+]: Secondary | ICD-10-CM | POA: Diagnosis not present

## 2021-03-25 DIAGNOSIS — Z79810 Long term (current) use of selective estrogen receptor modulators (SERMs): Secondary | ICD-10-CM | POA: Insufficient documentation

## 2021-03-25 LAB — CBC WITH DIFFERENTIAL/PLATELET
Abs Immature Granulocytes: 0.02 10*3/uL (ref 0.00–0.07)
Basophils Absolute: 0.1 10*3/uL (ref 0.0–0.1)
Basophils Relative: 1 %
Eosinophils Absolute: 0.1 10*3/uL (ref 0.0–0.5)
Eosinophils Relative: 2 %
HCT: 38.8 % (ref 36.0–46.0)
Hemoglobin: 12.7 g/dL (ref 12.0–15.0)
Immature Granulocytes: 0 %
Lymphocytes Relative: 26 %
Lymphs Abs: 1.7 10*3/uL (ref 0.7–4.0)
MCH: 30.4 pg (ref 26.0–34.0)
MCHC: 32.7 g/dL (ref 30.0–36.0)
MCV: 92.8 fL (ref 80.0–100.0)
Monocytes Absolute: 0.6 10*3/uL (ref 0.1–1.0)
Monocytes Relative: 9 %
Neutro Abs: 4.2 10*3/uL (ref 1.7–7.7)
Neutrophils Relative %: 62 %
Platelets: 272 10*3/uL (ref 150–400)
RBC: 4.18 MIL/uL (ref 3.87–5.11)
RDW: 12.8 % (ref 11.5–15.5)
WBC: 6.8 10*3/uL (ref 4.0–10.5)
nRBC: 0 % (ref 0.0–0.2)

## 2021-03-25 LAB — COMPREHENSIVE METABOLIC PANEL
ALT: 13 U/L (ref 0–44)
AST: 18 U/L (ref 15–41)
Albumin: 4.2 g/dL (ref 3.5–5.0)
Alkaline Phosphatase: 42 U/L (ref 38–126)
Anion gap: 10 (ref 5–15)
BUN: 5 mg/dL — ABNORMAL LOW (ref 8–23)
CO2: 24 mmol/L (ref 22–32)
Calcium: 9 mg/dL (ref 8.9–10.3)
Chloride: 100 mmol/L (ref 98–111)
Creatinine, Ser: 0.6 mg/dL (ref 0.44–1.00)
GFR, Estimated: >60 mL/min (ref >60)
Glucose, Bld: 108 mg/dL — ABNORMAL HIGH (ref 70–99)
Potassium: 4 mmol/L (ref 3.5–5.1)
Sodium: 134 mmol/L — ABNORMAL LOW (ref 135–145)
Total Bilirubin: 0.3 mg/dL (ref 0.3–1.2)
Total Protein: 7.3 g/dL (ref 6.5–8.1)

## 2021-03-25 LAB — VITAMIN D 25 HYDROXY (VIT D DEFICIENCY, FRACTURES): Vit D, 25-Hydroxy: 25.52 ng/mL — ABNORMAL LOW (ref 30–100)

## 2021-03-31 NOTE — Progress Notes (Signed)
[facility] [address], [place] [address]   Patient Care Team: Kathyrn Drown, MD as PCP - General (Family Medicine) Donetta Potts, RN as Oncology Nurse Navigator (Oncology) Derek Jack, MD as Medical Oncologist (Oncology)  SUMMARY OF ONCOLOGIC HISTORY: Oncology History  Mass of upper outer quadrant of left breast  08/17/2019 Initial Diagnosis   Mass of upper outer quadrant of left breast   11/11/2019 Genetic Testing        Malignant neoplasm of upper-outer quadrant of left female breast ([place])  09/15/2019 Initial Diagnosis   Malignant neoplasm of upper-outer quadrant of left female breast ([place])   09/15/2019 Cancer Staging   Staging form: Breast, AJCC 8th Edition - Clinical stage from 09/15/2019: Stage IIA (cT2, cN1(f), cM0, G2, ER+, PR+, HER2-) - Signed by Derek Jack, MD on 11/28/2019     CHIEF COMPLIANT: Follow-up of left breast cancer   INTERVAL HISTORY: Ms. Monica Russell is a 60 y.o. female here today for follow up of her left breast cancer. Her last visit was on 11/21/2020.   Today she reports feeling good. She is taking tamoxifen and is tolerating well. She had to hold tamoxifen for about 1 month due to 2 courses of antibiotics and steroids for a sinus infection. She is taking 1000 units of Vitamin D BID which she also had to hold for about 1 month due to the steroids and antibiotics. She is taking fosamax and tolerating it well. She reports feeling a lump in her left breast without any associated pain. She denies spotting and hot flashes.   REVIEW OF SYSTEMS:   Review of Systems  Constitutional:  Negative for appetite change and fatigue.  Endocrine: Negative for hot flashes.  Genitourinary:  Negative for vaginal bleeding.   All other systems reviewed and are negative.  I have reviewed the past medical history, past surgical history, social history and family history with the patient and they are unchanged from previous  note.   ALLERGIES:   is allergic to penicillins.   MEDICATIONS:  Current Outpatient Medications  Medication Sig Dispense Refill   albuterol (PROVENTIL) (2.5 MG/3ML) 0.083% nebulizer solution Take 3 mLs (2.5 mg total) by nebulization every 6 (six) hours as needed for wheezing or shortness of breath. 75 mL 12   alendronate (FOSAMAX) 70 MG tablet Take 1 tablet (70 mg total) by mouth once a week. Take with a full glass of water on an empty stomach. 45 tablet 3   benzonatate (TESSALON) 100 MG capsule Take 1 capsule (100 mg total) by mouth every 8 (eight) hours. 21 capsule 0   budesonide-formoterol (SYMBICORT) 80-4.5 MCG/ACT inhaler TAKE 2 PUFFS BY MOUTH TWICE A DAY 10.2 g 5   Cholecalciferol (VITAMIN D3) 50 MCG (2000 UT) CAPS Take by mouth.     pantoprazole (PROTONIX) 40 MG tablet Take 1 tablet (40 mg total) by mouth daily. 90 tablet 0   Respiratory Therapy Supplies (NEBULIZER) DEVI Use as directed 1 each 0   Respiratory Therapy Supplies (NEBULIZER/TUBING/MOUTHPIECE) KIT Use as directed 1 kit 0   simvastatin (ZOCOR) 40 MG tablet TAKE 1 TABLET BY MOUTH EVERY DAY FOR CHOLESTEROL 90 tablet 1   tamoxifen (NOLVADEX) 20 MG tablet Take 1 tablet (20 mg total) by mouth daily. 120 tablet 2   No current facility-administered medications for this visit.     PHYSICAL EXAMINATION: Performance status (ECOG): 0 - Asymptomatic  There were no vitals filed for this visit. Wt Readings from Last 3  Encounters:  01/08/21 152 lb 6.4 oz (69.1 kg)  11/21/20 154 lb 14.4 oz (70.3 kg)  08/14/20 154 lb 4.8 oz (70 kg)   Physical Exam Vitals reviewed.  Constitutional:      Appearance: Normal appearance.  Cardiovascular:     Rate and Rhythm: Normal rate and regular rhythm.     Pulses: Normal pulses.     Heart sounds: Normal heart sounds.  Pulmonary:     Effort: Pulmonary effort is normal.     Breath sounds: Normal breath sounds.  Chest:  Breasts:    Right: No mass, nipple discharge, skin change or  tenderness.     Left: Skin change (lumpectomy scar around areola within normal limits) present. No mass, nipple discharge or tenderness.  Abdominal:     Palpations: Abdomen is soft. There is no hepatomegaly, splenomegaly or mass.     Tenderness: There is no abdominal tenderness.  Lymphadenopathy:     Upper Body:     Right upper body: No supraclavicular, axillary or pectoral adenopathy.     Left upper body: No supraclavicular, axillary or pectoral adenopathy.  Neurological:     General: No focal deficit present.     Mental Status: She is alert and oriented to person, place, and time.  Psychiatric:        Mood and Affect: Mood normal.        Behavior: Behavior normal.    Breast Exam Chaperone: Thana Ates     LABORATORY DATA:  I have reviewed the data as listed CMP Latest Ref Rng & Units 03/25/2021 11/06/2020 08/06/2020  Glucose 70 - 99 mg/dL 108(H) 111(H) 115(H)  BUN 8 - 23 mg/dL 5(L) 5(L) 7(L)  Creatinine 0.44 - 1.00 mg/dL 0.60 0.58 0.60  Sodium 135 - 145 mmol/L 134(L) 133(L) 135  Potassium 3.5 - 5.1 mmol/L 4.0 4.3 3.6  Chloride 98 - 111 mmol/L 100 101 101  CO2 22 - 32 mmol/L _0 Calcium 8.9 - 10.3 mg/dL 9.0 9.0 9.0  Total Protein 6.5 - 8.1 g/dL 7.3 6.9 6.9  Total Bilirubin 0.3 - 1.2 mg/dL 0.3 0.3 0.7  Alkaline Phos 38 - 126 U/L 42 37(L) 43  AST 15 - 41 U/L 18 14(L) 17  ALT 0 - 44 U/L _1 No results found for: KGS811 Lab Results  Component Value Date   WBC 6.8 03/25/2021   HGB 12.7 03/25/2021   HCT 38.8 03/25/2021   MCV 92.8 03/25/2021   PLT 272 03/25/2021   NEUTROABS 4.2 03/25/2021    ASSESSMENT:  1.  Stage II (PT2PN1) infiltrating lobular carcinoma of the left breast: -Left lumpectomy and sentinel lymph node biopsy on 10/31/2019 with 3.7 cm grade 2 infiltrating lobular carcinoma, margins negative, 1/3 lymph nodes positive, Ki-67 2%, ER/PR 100%. -Oncotype DX score was 9 with 9-year distant recurrence with tamoxifen/AI was 12%.  No benefit from adjuvant  chemotherapy. -Anastrozole started on 11/28/2019. -She finished radiation therapy to the left breast. - Anastrozole changed to tamoxifen secondary to osteoporosis.   2.  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   3.  Osteoporosis: -DEXA scan on March 03, 2019 shows T score -3.4. -She was started on Prolia which was given on 03/14/2020. -She is not interested in any additional Prolia secondary to cost.   PLAN:  1.  Stage II (PT2PN1) infiltrating lobular carcinoma of the left breast: - She is tolerating  tamoxifen very well.  She had to stop tamoxifen for 3 to 4 weeks when she had sinus infection and lower respiratory infection recently. - Physical examination today stable lumpectomy scar around the left upper areola.  No other palpable masses.  No adenopathy. - Mammogram from 11/06/2020 was BI-RADS Category 2. - RTC 4 months with labs.   2.  Osteoporosis: - Fosamax 70 mg weekly was started in May.  She is tolerating it very well. - Calcium is within normal limits.   3.  Vitamin D deficiency: - She could not tolerate weekly dose. - She held her vitamin D for 3 to 4 weeks when she was being treated for sinus and lower respiratory infection. - Continue vitamin D 2000 units daily.  Vitamin D level is low at 25.  I would not make any changes at this time.  Breast Cancer therapy associated bone loss: I have recommended calcium, Vitamin D and weight bearing exercises.  Orders placed this encounter:  No orders of the defined types were placed in this encounter.   The patient has a good understanding of the overall plan. She agrees with it. She will call with any problems that may develop before the next visit here.  Derek Jack, MD [facility] [phone]   I, Thana Ates, am acting as a scribe for Dr. Derek Jack.  I, Derek Jack MD, have reviewed the above  documentation for accuracy and completeness, and I agree with the above.

## 2021-04-01 ENCOUNTER — Other Ambulatory Visit: Payer: Self-pay

## 2021-04-01 ENCOUNTER — Inpatient Hospital Stay (HOSPITAL_BASED_OUTPATIENT_CLINIC_OR_DEPARTMENT_OTHER): Payer: Commercial Managed Care - PPO | Admitting: Hematology

## 2021-04-01 ENCOUNTER — Encounter (HOSPITAL_COMMUNITY): Payer: Self-pay | Admitting: Hematology

## 2021-04-01 VITALS — BP 119/72 | HR 87 | Temp 98.3°F | Resp 18 | Wt 152.8 lb

## 2021-04-01 DIAGNOSIS — C50412 Malignant neoplasm of upper-outer quadrant of left female breast: Secondary | ICD-10-CM | POA: Diagnosis not present

## 2021-04-01 DIAGNOSIS — Z170 Estrogen receptor positive status [ER+]: Secondary | ICD-10-CM

## 2021-04-01 DIAGNOSIS — M810 Age-related osteoporosis without current pathological fracture: Secondary | ICD-10-CM

## 2021-04-01 NOTE — Patient Instructions (Addendum)
[facility] at [facility] Discharge Instructions  You were seen today by Dr. Delton Coombes. He went over your recent results and scans. Dr. Delton Coombes will see you back in 4 months for labs and follow up.   Thank you for choosing [facility] at [facility] to provide your oncology and hematology care.  To afford each patient quality time with our provider, please arrive at least 15 minutes before your scheduled appointment time.   If you have a lab appointment with the [facility] please come in thru the Main Entrance and check in at the main information desk  You need to re-schedule your appointment should you arrive 10 or more minutes late.  We strive to give you quality time with our providers, and arriving late affects you and other patients whose appointments are after yours.  Also, if you no show three or more times for appointments you may be dismissed from the clinic at the providers discretion.     Again, thank you for choosing [facility].  Our hope is that these requests will decrease the amount of time that you wait before being seen by our physicians.       _____________________________________________________________  Should you have questions after your visit to [facility], please contact our office at [phone] between the hours of 8:00 a.m. and 4:30 p.m.  Voicemails left after 4:00 p.m. will not be returned until the following business day.  For prescription refill requests, have your pharmacy contact our office and allow 72 hours.    Cancer Center Support Programs:   > Cancer Support Group  2nd Tuesday of the month 1pm-2pm, Journey Room

## 2021-04-12 ENCOUNTER — Other Ambulatory Visit: Payer: Self-pay | Admitting: Family Medicine

## 2021-04-16 ENCOUNTER — Telehealth: Payer: Self-pay

## 2021-04-16 ENCOUNTER — Other Ambulatory Visit: Payer: Self-pay | Admitting: Family Medicine

## 2021-04-16 NOTE — Telephone Encounter (Signed)
Fax received from [facility] stating Rx: budesonide/form 80/4.5 mcg- not covered by patient plan Alternative- fluticasone salmeterol, wixela inh ub, advair hfa, breo ellipta, dulera, symbicort.  Please advise

## 2021-04-21 NOTE — Telephone Encounter (Signed)
Hi Betzy Your note says that Symbicort is covered.  Please send this in as Symbicort with the strength of the generic Mark d.a.w. hopefully this will be covered this time may have 1 month with 6 months refill thank you

## 2021-04-22 MED ORDER — BUDESONIDE-FORMOTEROL FUMARATE 80-4.5 MCG/ACT IN AERO: 2.0000 | INHALATION_SPRAY | Freq: Two times a day (BID) | RESPIRATORY_TRACT | 5 refills | Status: DC

## 2021-04-22 NOTE — Addendum Note (Signed)
Addended by: Vicente Males on: 04/22/2021 11:32 AM   Modules accepted: Orders

## 2021-04-22 NOTE — Telephone Encounter (Signed)
Symbicort sent in; marked DAW

## 2021-04-26 IMAGING — DX MM BREAST SURGICAL SPECIMEN
2 series · 4 of 4 positions shown · non-contrast
Comparison: Previous exam(s).

CLINICAL DATA: Status post radioactive seed localization of LEFT
axillary lymph node.

EXAM:
SPECIMEN RADIOGRAPH OF THE LEFT AXILLA

[Series 2: specimen digital x-ray, derived · left · 0.10mm/px · 2 of 2 slices shown (1 of 2)]
[im 1/2]
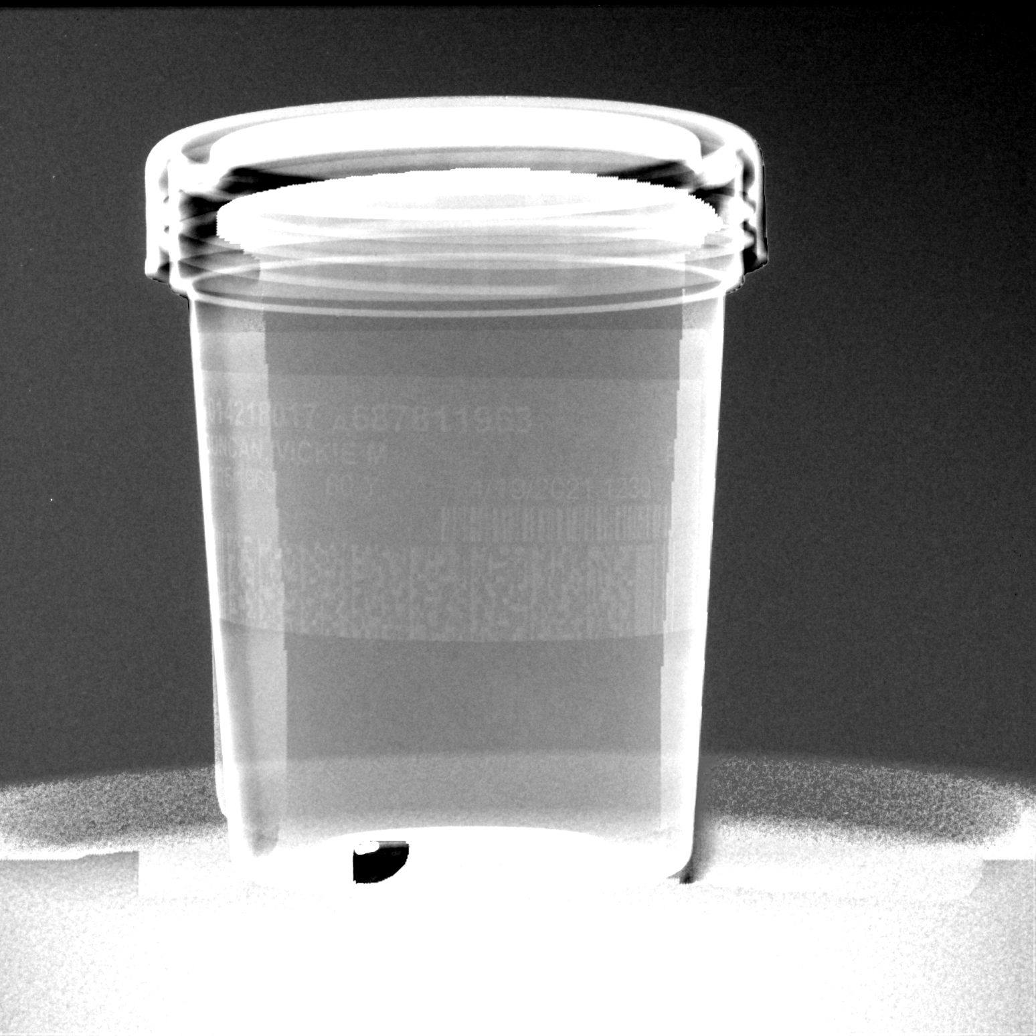
[im 2/2]
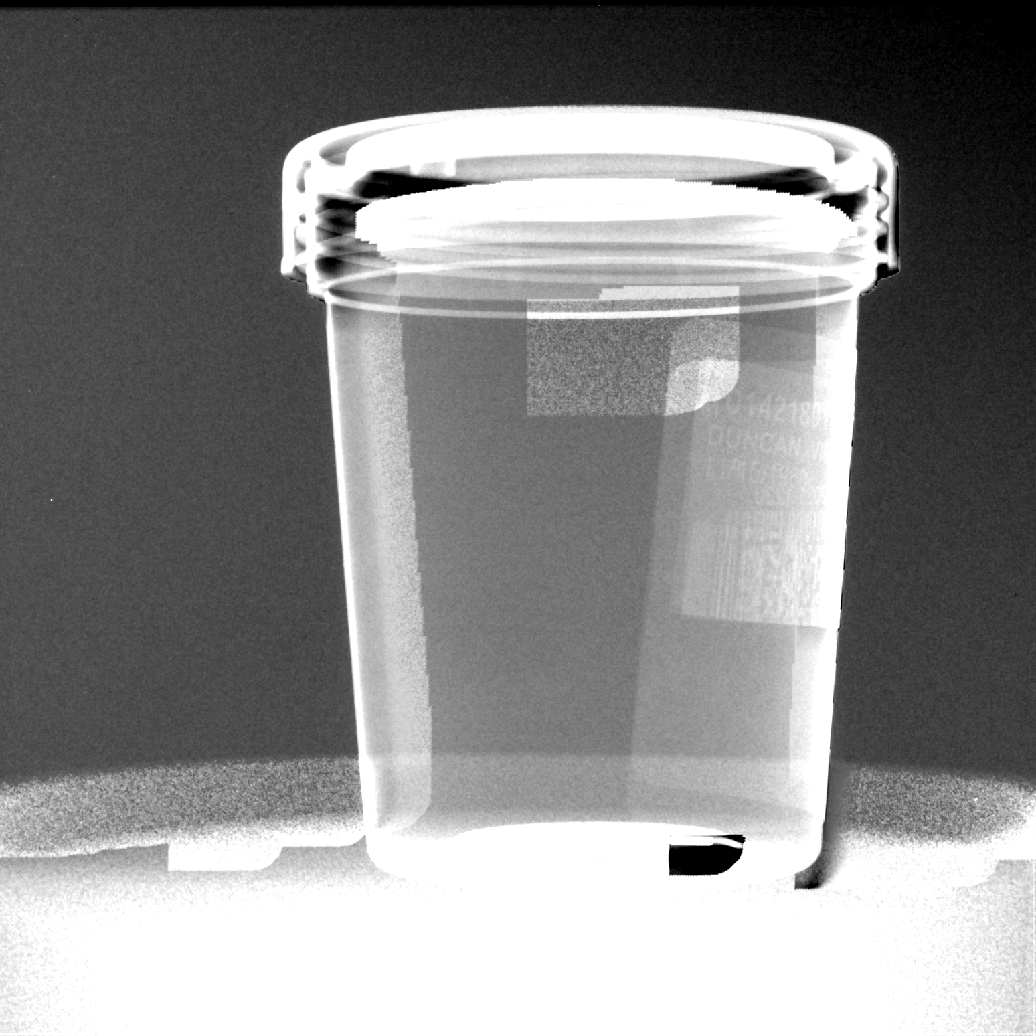

[Series 4: specimen digital x-ray, derived · left · 0.10mm/px · 2 of 2 slices shown (2 of 2)]
[im 1/2]
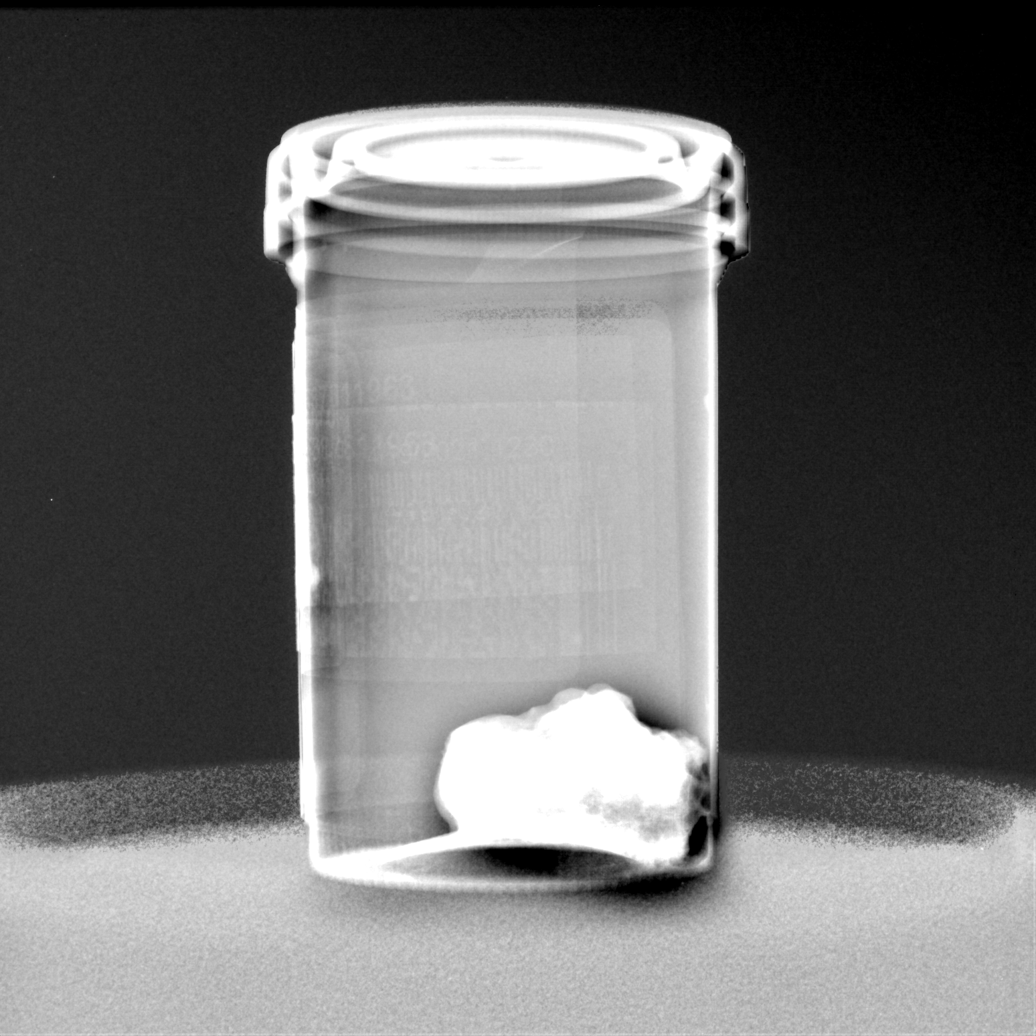
[im 2/2]
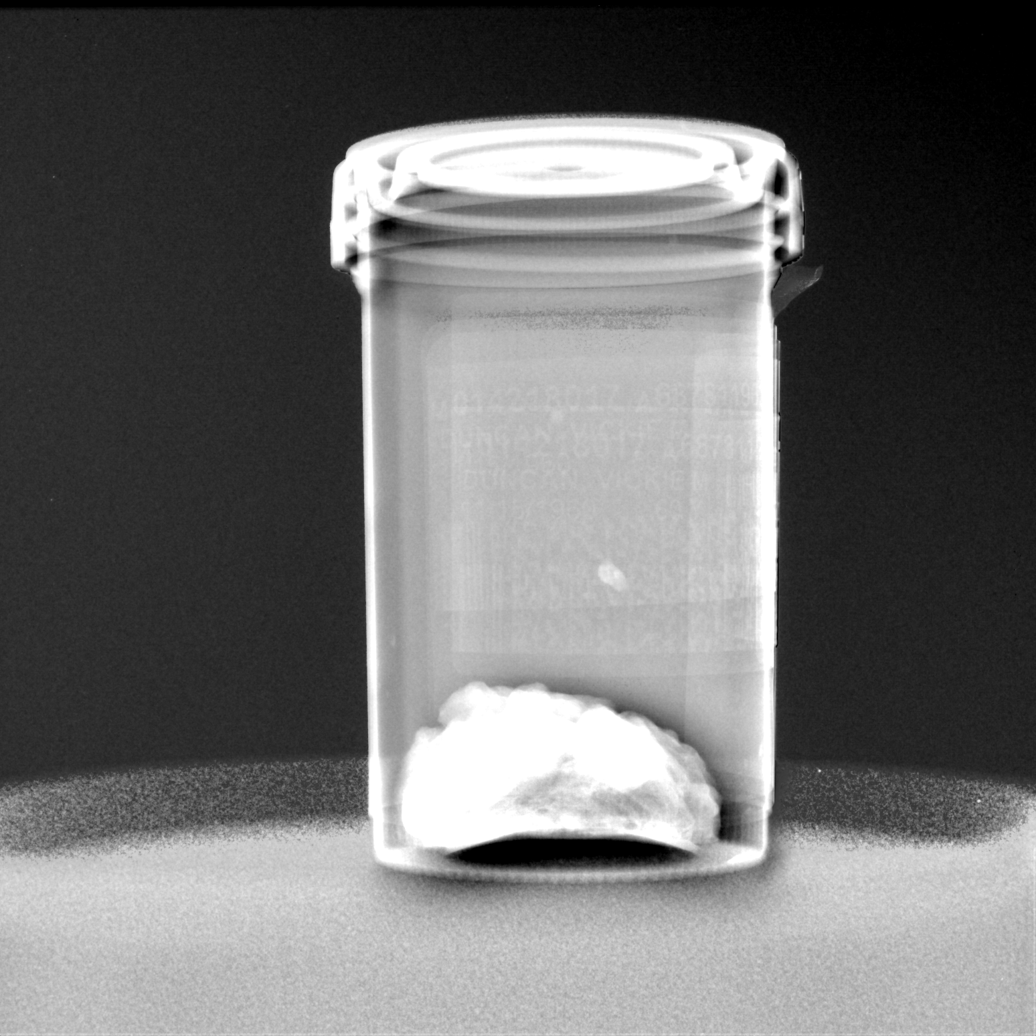

[4 of 4 positions shown; findings below may reference images not displayed]

FINDINGS: Status post excision of the LEFT axilla. The radioactive seed is
present, and is completely intact. Specimen radiograph shows no
tissue marker clip. Per discussion with Dr. Mitxel, the node in
question an adjacent small hematoma were removed, but the clip
likely separated from the specimen as did the radioactive seed.
IMPRESSION: Specimen radiograph of the left axilla.

Radioactive seed has been removed.

## 2021-05-15 ENCOUNTER — Other Ambulatory Visit (HOSPITAL_COMMUNITY): Payer: Self-pay | Admitting: Oncology

## 2021-05-20 ENCOUNTER — Other Ambulatory Visit (HOSPITAL_COMMUNITY)
Admission: RE | Admit: 2021-05-20 | Discharge: 2021-05-20 | Disposition: A | Discharge status: Home / Self Care | Payer: Commercial Managed Care - PPO | Source: Ambulatory Visit | Attending: Adult Health | Admitting: Adult Health

## 2021-05-20 ENCOUNTER — Telehealth: Payer: Self-pay | Admitting: Adult Health

## 2021-05-20 ENCOUNTER — Encounter: Payer: Self-pay | Admitting: Adult Health

## 2021-05-20 ENCOUNTER — Other Ambulatory Visit: Payer: Self-pay

## 2021-05-20 ENCOUNTER — Ambulatory Visit (INDEPENDENT_AMBULATORY_CARE_PROVIDER_SITE_OTHER): Payer: Commercial Managed Care - PPO | Admitting: Adult Health

## 2021-05-20 VITALS — BP 140/78 | HR 91 | Ht 67.0 in | Wt 151.0 lb

## 2021-05-20 DIAGNOSIS — N898 Other specified noninflammatory disorders of vagina: Secondary | ICD-10-CM | POA: Insufficient documentation

## 2021-05-20 MED ORDER — TRIAMCINOLONE ACETONIDE 0.5 % EX OINT: 1.0000 "application " | TOPICAL_OINTMENT | Freq: Two times a day (BID) | CUTANEOUS | 2 refills | Status: DC

## 2021-05-20 MED ORDER — FLUCONAZOLE 150 MG PO TABS: ORAL_TABLET | ORAL | 1 refills | Status: DC

## 2021-05-20 MED ORDER — NYSTATIN 100000 UNIT/GM EX CREA: 1.0000 "application " | TOPICAL_CREAM | Freq: Two times a day (BID) | CUTANEOUS | 2 refills | Status: DC

## 2021-05-20 NOTE — Telephone Encounter (Signed)
Has ?bugs, has been itching, to come in at 3 pm

## 2021-05-20 NOTE — Telephone Encounter (Signed)
Patient is calling wanting to know if you would call her about a infection that she has, explained to her that she would need appointment to see what type of infection and she stated that she wanted to speak to you first that she had some stuff going on.

## 2021-05-20 NOTE — Progress Notes (Signed)
  Subjective:     Patient ID: Monica Russell, female   DOB: 1961/03/23, 60 y.o.   MRN: [id number]  HPI Monica Russell is a 60 year old white female, married,PM, worked in for itching in vaginal area, tried vagisil without relief, and has seen dark specks on pad, ?bug. PCP is [facility]. Lab Results  Component Value Date   DIAGPAP  08/11/2017    NEGATIVE FOR INTRAEPITHELIAL LESIONS OR MALIGNANCY.   HPV NOT DETECTED 08/11/2017    Review of Systems Itching in vaginal area Reviewed past medical,surgical, social and family history. Reviewed medications and allergies.     Objective:   Physical Exam BP 140/78 (BP Location: Left Arm, Patient Position: Sitting, Cuff Size: Normal)   Pulse 91   Ht 5\' 7"  (1.702 m)   Wt 151 lb (68.5 kg)   BMI 23.65 kg/m     Skin warm and dry.Pelvic: external genitalia is normal in appearance no lesions,no bugs,lice seen, vagina: white discharge without,odor,urethra has no lesions or masses noted, cervix:smooth,CV swab obtained, uterus: normal size, shape and contour, non tender, no masses felt, adnexa: no masses or tenderness noted. Bladder is non tender and no masses felt.  She showed her pad, I think the black specks in the cotton pad,not from her body.  [place] - 05/20/21 1514       Pregnancy Intention Screening   Does the patient want to become pregnant in the next year? No    Does the patient's partner want to become pregnant in the next year? No    Would the patient like to discuss contraceptive options today? No      Contraception Wrap Up   Current Method No Method - Other Reason    End Method No Method - Other Reason    Contraception Counseling Provided No             Assessment:     1. Itching in the vaginal area CV swab sent for GC/CHL,trich,BV and yeast    Will rx diflucan and nystatin cream and triamcinolone  Meds ordered this encounter  Medications   fluconazole (DIFLUCAN) 150 MG tablet    Sig: Take 1 now and 1 in 3 days    Dispense:   2 tablet    Refill:  1    Order Specific Question:   Supervising Provider    Answer:   Tania Ade H [[id number]]   nystatin cream (MYCOSTATIN)    Sig: Apply 1 application topically 2 (two) times daily.    Dispense:  30 g    Refill:  2    Order Specific Question:   Supervising Provider    Answer:   Tania Ade H [[id number]]   triamcinolone ointment (KENALOG) 0.5 %    Sig: Apply 1 application topically 2 (two) times daily.    Dispense:  30 g    Refill:  2    Order Specific Question:   Supervising Provider    Answer:   Florian Buff [[id number]]    Plan:     Has pap and physical in January

## 2021-05-22 LAB — CERVICOVAGINAL ANCILLARY ONLY
Bacterial Vaginitis (gardnerella): NEGATIVE
Candida Glabrata: NEGATIVE
Candida Vaginitis: POSITIVE — AB
Chlamydia: NEGATIVE
Comment: NEGATIVE
Comment: NEGATIVE
Comment: NEGATIVE
Comment: NEGATIVE
Comment: NEGATIVE
Comment: NORMAL
Neisseria Gonorrhea: NEGATIVE
Trichomonas: NEGATIVE

## 2021-05-28 ENCOUNTER — Other Ambulatory Visit: Payer: Self-pay | Admitting: Family Medicine

## 2021-05-28 NOTE — Telephone Encounter (Signed)
Sent my chart message 05/28/21

## 2021-05-31 ENCOUNTER — Telehealth: Payer: Self-pay | Admitting: Family Medicine

## 2021-05-31 MED ORDER — SIMVASTATIN 40 MG PO TABS: ORAL_TABLET | ORAL | 0 refills | Status: DC

## 2021-05-31 NOTE — Telephone Encounter (Signed)
Patient needs refill on simvastatin 40 mg called in [facility] she has appointment on 12/8 for follow up.

## 2021-05-31 NOTE — Telephone Encounter (Signed)
Prescription sent electronically to pharmacy. Patient notified. 

## 2021-06-03 NOTE — Telephone Encounter (Signed)
Scheduled appointment 06/20/21

## 2021-06-18 LAB — LIPID PANEL
Chol/HDL Ratio: 2.8 ratio (ref 0.0–4.4)
Cholesterol, Total: 190 mg/dL (ref 100–199)
HDL: 69 mg/dL (ref >39)
LDL Chol Calc (NIH): 106 mg/dL — ABNORMAL HIGH (ref 0–99)
Triglycerides: 80 mg/dL (ref 0–149)
VLDL Cholesterol Cal: 15 mg/dL (ref 5–40)

## 2021-06-18 LAB — MICROALBUMIN / CREATININE URINE RATIO
Creatinine, Urine: 110.4 mg/dL
Microalb/Creat Ratio: 38 mg/g creat — ABNORMAL HIGH (ref 0–29)
Microalbumin, Urine: 41.7 ug/mL

## 2021-06-20 ENCOUNTER — Encounter: Payer: Self-pay | Admitting: Family Medicine

## 2021-06-20 ENCOUNTER — Ambulatory Visit (INDEPENDENT_AMBULATORY_CARE_PROVIDER_SITE_OTHER): Payer: Commercial Managed Care - PPO | Admitting: Family Medicine

## 2021-06-20 ENCOUNTER — Other Ambulatory Visit: Payer: Self-pay

## 2021-06-20 VITALS — BP 129/84 | HR 79 | Temp 97.9°F | Ht 67.0 in | Wt 152.0 lb

## 2021-06-20 DIAGNOSIS — K219 Gastro-esophageal reflux disease without esophagitis: Secondary | ICD-10-CM

## 2021-06-20 DIAGNOSIS — K648 Other hemorrhoids: Secondary | ICD-10-CM

## 2021-06-20 DIAGNOSIS — E782 Mixed hyperlipidemia: Secondary | ICD-10-CM | POA: Diagnosis not present

## 2021-06-20 MED ORDER — ROSUVASTATIN CALCIUM 20 MG PO TABS: 20.0000 mg | ORAL_TABLET | Freq: Every day | ORAL | 1 refills | Status: DC

## 2021-06-20 MED ORDER — PANTOPRAZOLE SODIUM 40 MG PO TBEC: 40.0000 mg | DELAYED_RELEASE_TABLET | Freq: Every day | ORAL | 1 refills | Status: DC

## 2021-06-20 NOTE — Progress Notes (Signed)
   Subjective:    Patient ID: Monica Russell, female    DOB: 08/20/1960, 60 y.o.   MRN: [id number]  HPI 6 month follow up hyperlipidemia, prediabetes, hemorrhoids - no blood in stool, states feels like a golf ball, has been using otc suppositories, exp problems with BM's, req ref to GI   The 10-year ASCVD risk score (Arnett DK, et al., 2019) is: 6.9%   Values used to calculate the score:     Age: 60 years     Sex: Female     Is Non-Hispanic African American: No     Diabetic: No     Tobacco smoker: Yes     Systolic Blood Pressure: 884 mmHg     Is BP treated: No     HDL Cholesterol: 69 mg/dL     Total Cholesterol: 190 mg/dL Overdose good overall health pain management.  Review of Systems     Objective:   Physical Exam  General-in no acute distress Eyes-no discharge Lungs-respiratory rate normal, CTA CV-no murmurs,RRR Extremities skin warm dry no edema Neuro grossly normal Behavior normal, alert  Rectal exam revealed internal hemorrhoids recommend referral to gastroenterology     Assessment & Plan:   Given her cholesterol findings are highly recommend changing medication over to Crestor we will relook at lab work again by late spring time  Referral over to gastroenterology urgent patient having fair amount of internal discomfort.  Hopefully they will be able to help see her in relatively soon fashion.  Stool softeners regular basis

## 2021-06-24 NOTE — Progress Notes (Signed)
Referral placed to GI and referral coordinator made aware

## 2021-06-24 NOTE — Addendum Note (Signed)
Addended by: Vicente Males on: 06/24/2021 03:12 PM   Modules accepted: Orders

## 2021-06-27 ENCOUNTER — Encounter (INDEPENDENT_AMBULATORY_CARE_PROVIDER_SITE_OTHER): Payer: Self-pay | Admitting: *Deleted

## 2021-07-16 ENCOUNTER — Other Ambulatory Visit: Payer: Self-pay

## 2021-07-16 ENCOUNTER — Ambulatory Visit (INDEPENDENT_AMBULATORY_CARE_PROVIDER_SITE_OTHER): Payer: Commercial Managed Care - PPO | Admitting: Gastroenterology

## 2021-07-16 ENCOUNTER — Encounter (INDEPENDENT_AMBULATORY_CARE_PROVIDER_SITE_OTHER): Payer: Self-pay | Admitting: Gastroenterology

## 2021-07-16 VITALS — BP 137/84 | HR 84 | Temp 98.2°F | Ht 67.0 in | Wt 151.0 lb

## 2021-07-16 DIAGNOSIS — K649 Unspecified hemorrhoids: Secondary | ICD-10-CM | POA: Diagnosis not present

## 2021-07-16 DIAGNOSIS — K5900 Constipation, unspecified: Secondary | ICD-10-CM

## 2021-07-16 MED ORDER — HYDROCORTISONE ACETATE 25 MG RE SUPP: 25.0000 mg | Freq: Two times a day (BID) | RECTAL | 2 refills | Status: DC | PRN

## 2021-07-16 NOTE — Patient Instructions (Addendum)
Please continue to stay well hydrated and eat a diet high in fruits, veggies and whole grains. Start taking Miralax 1 capful every day for one week. If bowel movements do not improve, increase to 1 capful every 12 hours. If after two weeks there is no improvement, increase to 1 capful every 8 hours  I have sent anusol suppositories to your pharmacy, you can use these up to twice a day, they should help to shrink the internal hemorrhoids.   Please let me know if you develop any new or worsening GI symptoms, if your discomfort does not improve, we will consider doing a colonoscopy and can possibly refer you to Roseanne Kaufman, NP for hemorrhoid banding.  Follow up 6-8 weeks

## 2021-07-16 NOTE — Progress Notes (Signed)
Referring Provider: Kathyrn Drown, MD Primary Care Physician:  Kathyrn Drown, MD Primary GI Physician: newly established  Chief Complaint  Patient presents with   Hemorrhoids    Patient here today due to internal hemorrhoids. Patient states she had a feeling of sitting on a golf ball. She was told by pcp she had an internal hemorrhoid, which is better after using preparation H suppositories.    HPI:   LADONA ROSTEN is a 61 y.o. female with past medical history of breast cancer, high cholesterol, IBS, renal cell cancer.  Patient presenting today as a new patient for internal hemorrhoids.  She states that she saw her PCP in December and reported to him that she felt like she was "sitting on a golf ball" she states that she was having very hard stools and having difficulty passing them,  on exam with PCP, she was told she had some large internal hemorrhoids present. She has increased her water intake, was doing stool softeners and preparation H suppositories. She is no longer doing stool softeners or suppositories, but feels that short course of those improved her symptoms quite a bit, she is having a BM 1-2x/day.  She reports that she still is experiencing some constipation and has to strain some to go but stools are not has hard since increasing water, and her rectal discomfort is not nearly as bad as it was. She does really watch what she eats and avoids fried foods, dairy and other triggers.  She reports that now sitting she can still feel the sensation of a "golf ball" but that it is nowhere near as bad as when she saw PCP. She denies any rectal bleeding or black stools.She denies any weight loss, abdominal pain, nausea or vomiting.    Last Colonoscopy:04/28/12 scattered diverticula throughout colon, colonic anastomosis at 18cm from anal margin, small external hemorrhoids Last Endoscopy: n/a  Recommendations:  Repeat in 10 years  Past Medical History:  Diagnosis Date   Breast  cancer ([place])    High cholesterol    IBS (irritable bowel syndrome)    Renal cell cancer ([place])     Past Surgical History:  Procedure Laterality Date   BREAST LUMPECTOMY WITH RADIOACTIVE SEED AND SENTINEL LYMPH NODE BIOPSY Left 10/31/2019   Procedure: LEFT BREAST LUMPECTOMY X 2 WITH RADIOACTIVE SEED AND SENTINEL LYMPH NODE BIOPSY, LEFT AXILLARY SENTINEL NODE RADIOACTIVE SEED GUIDED EXCISION;  Surgeon: Rolm Bookbinder, MD;  Location: [facility];  Service: General;  Laterality: Left;   CHOLECYSTECTOMY     COLONOSCOPY  04/28/2012   Procedure: COLONOSCOPY;  Surgeon: Rogene Houston, MD;  [facility];  Service: Endoscopy;  Laterality: N/A;  155   PARTIAL COLECTOMY     for diverticulitis   PARTIAL NEPHRECTOMY     11 yrs ago for a renal carcinoma, right    Current Outpatient Medications  Medication Sig Dispense Refill   albuterol (PROVENTIL) (2.5 MG/3ML) 0.083% nebulizer solution Take 3 mLs (2.5 mg total) by nebulization every 6 (six) hours as needed for wheezing or shortness of breath. 75 mL 12   alendronate (FOSAMAX) 70 MG tablet Take 1 tablet (70 mg total) by mouth once a week. Take with a full glass of water on an empty stomach. 45 tablet 3   Cholecalciferol (VITAMIN D3) 50 MCG (2000 UT) CAPS Take by mouth. 1200 mg per day.     pantoprazole (PROTONIX) 40 MG tablet Take 1 tablet (40 mg total) by mouth daily. [place]  tablet 1   Respiratory Therapy Supplies (NEBULIZER/TUBING/MOUTHPIECE) KIT Use as directed 1 kit 0   rosuvastatin (CRESTOR) 20 MG tablet Take 1 tablet (20 mg total) by mouth daily. 90 tablet 1   tamoxifen (NOLVADEX) 20 MG tablet TAKE 1 TABLET BY MOUTH DAILY 120 tablet 2   Respiratory Therapy Supplies (NEBULIZER) DEVI Use as directed (Patient not taking: Reported on 07/16/2021) 1 each 0   triamcinolone ointment (KENALOG) 0.5 % Apply 1 application topically 2 (two) times daily. (Patient not taking: Reported on 07/16/2021) 30 g 2   No current facility-administered  medications for this visit.    Allergies as of 07/16/2021 - Review Complete 07/16/2021  Allergen Reaction Noted   Penicillins Hives 04/07/2012    Family History  Problem Relation Age of Onset   Heart disease Maternal Grandmother    Diabetes Maternal Grandmother    Heart disease Maternal Grandfather    Dementia Maternal Grandfather    Hypertension Mother    Mental illness Mother    Mental illness Brother     Social History   Socioeconomic History   Marital status: Married    Spouse name: Not on file   Number of children: 2   Years of education: Not on file   Highest education level: Not on file  Occupational History   Occupation: Retired  Tobacco Use   Smoking status: Every Day    Packs/day: 0.50    Years: 40.00    Pack years: 20.00    Types: Cigarettes   Smokeless tobacco: Never   Tobacco comments:    10-12 a day  Vaping Use   Vaping Use: Never used  Substance and Sexual Activity   Alcohol use: No    Alcohol/week: 0.0 standard drinks    Comment: rarely   Drug use: No   Sexual activity: Yes    Birth control/protection: Post-menopausal  Other Topics Concern   Not on file  Social History Narrative   Pt's husband has dementia   Social Determinants of Radio broadcast assistant Strain: Not on file  Food Insecurity: Not on file  Transportation Needs: Not on file  Physical Activity: Not on file  Stress: Not on file  Social Connections: Not on file   Review of systems General: negative for malaise, night sweats, fever, chills, weight loss Neck: Negative for lumps, goiter, pain and significant neck swelling Resp: Negative for cough, wheezing, dyspnea at rest CV: Negative for chest pain, leg swelling, palpitations, orthopnea GI: denies melena, hematochezia, nausea, vomiting, diarrhea, dysphagia, odyonophagia, early satiety or unintentional weight loss. +constipation +rectal discomfort MSK: Negative for joint pain or swelling, back pain, and muscle  pain. Derm: Negative for itching or rash Psych: Denies depression, anxiety, memory loss, confusion. No homicidal or suicidal ideation.  Heme: Negative for prolonged bleeding, bruising easily, and swollen nodes. Endocrine: Negative for cold or heat intolerance, polyuria, polydipsia and goiter. Neuro: negative for tremor, gait imbalance, syncope and seizures. The remainder of the review of systems is noncontributory.  Physical Exam: BP 137/84 (BP Location: Left Arm, Patient Position: Sitting, Cuff Size: Large)    Pulse 84    Temp 98.2 F (36.8 C) (Oral)    Ht 5' 7"  (1.702 m)    Wt 151 lb (68.5 kg)    BMI 23.65 kg/m  General:   Alert and oriented. No distress noted. Pleasant and cooperative.  Head:  Normocephalic and atraumatic. Eyes:  Conjuctiva clear without scleral icterus. Mouth:  Oral mucosa pink and moist. Good dentition. No  lesions. Heart: Normal rate and rhythm, s1 and s2 heart sounds present.  Lungs: Clear lung sounds in all lobes. Respirations equal and unlabored. Abdomen:  +BS, soft, non-tender and non-distended. No rebound or guarding. No HSM or masses noted. Derm: No palmar erythema or jaundice Msk:  Symmetrical without gross deformities. Normal posture. Extremities:  Without edema. Neurologic:  Alert and  oriented x4 Psych:  Alert and cooperative. Normal mood and affect.  Invalid input(s): 6 MONTHS   ASSESSMENT: JO-ANNE KLUTH is a 60 y.o. female presenting today as a new patient for internal hemorrhoids.  Patient saw PCP at the beginning of December with c/o feeling like she was "sitting on a golf ball" she was also experiencing some constipation at that time. Patient had rectal exam done by PCP that revealed large internal hemorrhoids. She states that since seeing him she has increased her water and fiber intake, has used preparation H suppositories and stool softeners and symptoms have improved greatly. She is no longer doing stool softeners or suppositories at this time.  Is having 1-2 BMs per day. We discussed proceeding with colonoscopy and possibly referring to Roseanne Kaufman NP at [facility] for hemorrhoid banding, or attempting to more conservatively manage hemorrhoids/symptoms at this time with anusol and miralax. Patient prefers to start conservatively. Reassuringly, symptoms improved some with previous intervention and she has no alarm symptoms, however, if she develops new symptoms or symptoms fail to improve, we will need to proceed with colonoscopy. Patient verbalized understanding.   PLAN:  Start miralax (1 capful/day x1 week, increase to 2 capfuls/day if no improvement after 1 week) 2. Continue to stay well hydrated with plenty of water 3. Anusol suppositories BID PRN 4. Diet high in fruits, veggies, fiber/whole grains 5. Will proceed with colonoscopy/possible hemorrhoid banding if symptoms fail to improve or new symptoms develop   Follow Up: 6-8 weeks  Marisabel Macpherson L. Alver Sorrow, MSN, APRN, AGNP-C Adult-Gerontology Nurse Practitioner [facility]

## 2021-08-05 ENCOUNTER — Other Ambulatory Visit: Payer: Self-pay

## 2021-08-05 ENCOUNTER — Inpatient Hospital Stay (HOSPITAL_COMMUNITY): Payer: Commercial Managed Care - PPO | Attending: Hematology

## 2021-08-05 DIAGNOSIS — C50412 Malignant neoplasm of upper-outer quadrant of left female breast: Secondary | ICD-10-CM | POA: Insufficient documentation

## 2021-08-05 DIAGNOSIS — M810 Age-related osteoporosis without current pathological fracture: Secondary | ICD-10-CM | POA: Diagnosis not present

## 2021-08-05 DIAGNOSIS — Z79811 Long term (current) use of aromatase inhibitors: Secondary | ICD-10-CM | POA: Insufficient documentation

## 2021-08-05 DIAGNOSIS — Z170 Estrogen receptor positive status [ER+]: Secondary | ICD-10-CM | POA: Diagnosis not present

## 2021-08-05 LAB — COMPREHENSIVE METABOLIC PANEL
ALT: 12 U/L (ref 0–44)
AST: 15 U/L (ref 15–41)
Albumin: 4.2 g/dL (ref 3.5–5.0)
Alkaline Phosphatase: 34 U/L — ABNORMAL LOW (ref 38–126)
Anion gap: 8 (ref 5–15)
BUN: 6 mg/dL — ABNORMAL LOW (ref 8–23)
CO2: 25 mmol/L (ref 22–32)
Calcium: 8.9 mg/dL (ref 8.9–10.3)
Chloride: 102 mmol/L (ref 98–111)
Creatinine, Ser: 0.52 mg/dL (ref 0.44–1.00)
GFR, Estimated: >60 mL/min (ref >60)
Glucose, Bld: 107 mg/dL — ABNORMAL HIGH (ref 70–99)
Potassium: 4 mmol/L (ref 3.5–5.1)
Sodium: 135 mmol/L (ref 135–145)
Total Bilirubin: 0.4 mg/dL (ref 0.3–1.2)
Total Protein: 7 g/dL (ref 6.5–8.1)

## 2021-08-05 LAB — CBC WITH DIFFERENTIAL/PLATELET
Abs Immature Granulocytes: 0.01 10*3/uL (ref 0.00–0.07)
Basophils Absolute: 0.1 10*3/uL (ref 0.0–0.1)
Basophils Relative: 1 %
Eosinophils Absolute: 0.1 10*3/uL (ref 0.0–0.5)
Eosinophils Relative: 2 %
HCT: 35.8 % — ABNORMAL LOW (ref 36.0–46.0)
Hemoglobin: 11.8 g/dL — ABNORMAL LOW (ref 12.0–15.0)
Immature Granulocytes: 0 %
Lymphocytes Relative: 33 %
Lymphs Abs: 2 10*3/uL (ref 0.7–4.0)
MCH: 30.6 pg (ref 26.0–34.0)
MCHC: 33 g/dL (ref 30.0–36.0)
MCV: 92.7 fL (ref 80.0–100.0)
Monocytes Absolute: 0.7 10*3/uL (ref 0.1–1.0)
Monocytes Relative: 11 %
Neutro Abs: 3.3 10*3/uL (ref 1.7–7.7)
Neutrophils Relative %: 53 %
Platelets: 222 10*3/uL (ref 150–400)
RBC: 3.86 MIL/uL — ABNORMAL LOW (ref 3.87–5.11)
RDW: 13.1 % (ref 11.5–15.5)
WBC: 6.1 10*3/uL (ref 4.0–10.5)
nRBC: 0 % (ref 0.0–0.2)

## 2021-08-05 LAB — VITAMIN D 25 HYDROXY (VIT D DEFICIENCY, FRACTURES): Vit D, 25-Hydroxy: 27.59 ng/mL — ABNORMAL LOW (ref 30–100)

## 2021-08-06 ENCOUNTER — Other Ambulatory Visit: Payer: Commercial Managed Care - PPO | Admitting: Adult Health

## 2021-08-12 ENCOUNTER — Inpatient Hospital Stay (HOSPITAL_BASED_OUTPATIENT_CLINIC_OR_DEPARTMENT_OTHER): Payer: Commercial Managed Care - PPO | Admitting: Hematology

## 2021-08-12 ENCOUNTER — Encounter (HOSPITAL_COMMUNITY): Payer: Self-pay | Admitting: Hematology

## 2021-08-12 ENCOUNTER — Other Ambulatory Visit: Payer: Self-pay

## 2021-08-12 ENCOUNTER — Other Ambulatory Visit (HOSPITAL_COMMUNITY): Payer: Self-pay | Admitting: Hematology

## 2021-08-12 VITALS — BP 146/84 | HR 82 | Temp 97.6°F | Resp 16 | Wt 150.0 lb

## 2021-08-12 DIAGNOSIS — C50412 Malignant neoplasm of upper-outer quadrant of left female breast: Secondary | ICD-10-CM | POA: Diagnosis not present

## 2021-08-12 DIAGNOSIS — Z170 Estrogen receptor positive status [ER+]: Secondary | ICD-10-CM

## 2021-08-12 DIAGNOSIS — M810 Age-related osteoporosis without current pathological fracture: Secondary | ICD-10-CM | POA: Diagnosis not present

## 2021-08-12 NOTE — Progress Notes (Signed)
[facility] [address] [place] [address]   Patient Care Team: Kathyrn Drown, MD as PCP - General (Family Medicine) Donetta Potts, RN as Oncology Nurse Navigator (Oncology) Derek Jack, MD as Medical Oncologist (Oncology)  SUMMARY OF ONCOLOGIC HISTORY: Oncology History  Mass of upper outer quadrant of left breast  08/17/2019 Initial Diagnosis   Mass of upper outer quadrant of left breast   11/11/2019 Genetic Testing        Malignant neoplasm of upper-outer quadrant of left female breast ([place])  09/15/2019 Initial Diagnosis   Malignant neoplasm of upper-outer quadrant of left female breast ([place])   09/15/2019 Cancer Staging   Staging form: Breast, AJCC 8th Edition - Clinical stage from 09/15/2019: Stage IIA (cT2, cN1(f), cM0, G2, ER+, PR+, HER2-) - Signed by Derek Jack, MD on 11/28/2019      CHIEF COMPLIANT: Follow-up of left breast cancer   INTERVAL HISTORY: Monica Russell is a 61 y.o. female here today for follow up of her left breast cancer. Her last visit was on 04/01/2021.   Today she reports feeling good. She denies hot flashes and aches/pains. She is taking calcium and vitamin D.    REVIEW OF SYSTEMS:   Review of Systems  Constitutional:  Negative for appetite change and fatigue.  Endocrine: Negative for hot flashes.  Musculoskeletal:  Negative for arthralgias.  All other systems reviewed and are negative.  I have reviewed the past medical history, past surgical history, social history and family history with the patient and they are unchanged from previous note.   ALLERGIES:   is allergic to penicillins.   MEDICATIONS:  Current Outpatient Medications  Medication Sig Dispense Refill   albuterol (PROVENTIL) (2.5 MG/3ML) 0.083% nebulizer solution Take 3 mLs (2.5 mg total) by nebulization every 6 (six) hours as needed for wheezing or shortness of breath. 75 mL 12   alendronate (FOSAMAX) 70 MG tablet Take 1 tablet (70  mg total) by mouth once a week. Take with a full glass of water on an empty stomach. 45 tablet 3   Cholecalciferol (VITAMIN D3) 50 MCG (2000 UT) CAPS Take by mouth. 1200 mg per day.     hydrocortisone (ANUSOL-HC) 25 MG suppository Place 1 suppository (25 mg total) rectally 2 (two) times daily as needed for hemorrhoids or anal itching. 24 suppository 2   pantoprazole (PROTONIX) 40 MG tablet Take 1 tablet (40 mg total) by mouth daily. 90 tablet 1   Respiratory Therapy Supplies (NEBULIZER) DEVI Use as directed 1 each 0   Respiratory Therapy Supplies (NEBULIZER/TUBING/MOUTHPIECE) KIT Use as directed 1 kit 0   rosuvastatin (CRESTOR) 20 MG tablet Take 1 tablet (20 mg total) by mouth daily. 90 tablet 1   tamoxifen (NOLVADEX) 20 MG tablet TAKE 1 TABLET BY MOUTH DAILY 120 tablet 2   triamcinolone ointment (KENALOG) 0.5 % Apply 1 application topically 2 (two) times daily. 30 g 2   No current facility-administered medications for this visit.     PHYSICAL EXAMINATION: Performance status (ECOG): 0 - Asymptomatic  Vitals:   08/12/21 1153  BP: (!) 146/84  Pulse: 82  Resp: 16  Temp: 97.6 F (36.4 C)   Wt Readings from Last 3 Encounters:  08/12/21 150 lb (68 kg)  07/16/21 151 lb (68.5 kg)  06/20/21 152 lb (68.9 kg)   Physical Exam Vitals reviewed.  Constitutional:      Appearance: Normal appearance.  Cardiovascular:     Rate and Rhythm: Normal rate  and regular rhythm.     Pulses: Normal pulses.     Heart sounds: Normal heart sounds.  Pulmonary:     Effort: Pulmonary effort is normal.     Breath sounds: Normal breath sounds.  Chest:  Breasts:    Right: Normal. No swelling, bleeding, inverted nipple, mass, nipple discharge, skin change or tenderness.     Left: Swelling (lymphedema present in lower part of breast) present. No bleeding, inverted nipple, mass, nipple discharge, skin change (lumpectomy scar around areola WNL) or tenderness.  Lymphadenopathy:     Upper Body:     Right upper  body: No supraclavicular, axillary or pectoral adenopathy.     Left upper body: No supraclavicular, axillary or pectoral adenopathy.  Neurological:     General: No focal deficit present.     Mental Status: She is alert and oriented to person, place, and time.  Psychiatric:        Mood and Affect: Mood normal.        Behavior: Behavior normal.    Breast Exam Chaperone: Thana Ates     LABORATORY DATA:  I have reviewed the data as listed CMP Latest Ref Rng & Units 08/05/2021 03/25/2021 11/06/2020  Glucose 70 - 99 mg/dL 107(H) 108(H) 111(H)  BUN 8 - 23 mg/dL 6(L) 5(L) 5(L)  Creatinine 0.44 - 1.00 mg/dL 0.52 0.60 0.58  Sodium 135 - 145 mmol/L 135 134(L) 133(L)  Potassium 3.5 - 5.1 mmol/L 4.0 4.0 4.3  Chloride 98 - 111 mmol/L 102 100 101  CO2 22 - 32 mmol/L 25 24 25   Calcium 8.9 - 10.3 mg/dL 8.9 9.0 9.0  Total Protein 6.5 - 8.1 g/dL 7.0 7.3 6.9  Total Bilirubin 0.3 - 1.2 mg/dL 0.4 0.3 0.3  Alkaline Phos 38 - 126 U/L 34(L) 42 37(L)  AST 15 - 41 U/L 15 18 14(L)  ALT 0 - 44 U/L 12 13 12    No results found for: SFS239 Lab Results  Component Value Date   WBC 6.1 08/05/2021   HGB 11.8 (L) 08/05/2021   HCT 35.8 (L) 08/05/2021   MCV 92.7 08/05/2021   PLT 222 08/05/2021   NEUTROABS 3.3 08/05/2021    ASSESSMENT:  1.  Stage II (PT2PN1) infiltrating lobular carcinoma of the left breast: -Left lumpectomy and sentinel lymph node biopsy on 10/31/2019 with 3.7 cm grade 2 infiltrating lobular carcinoma, margins negative, 1/3 lymph nodes positive, Ki-67 2%, ER/PR 100%. -Oncotype DX score was 9 with 9-year distant recurrence with tamoxifen/AI was 12%.  No benefit from adjuvant chemotherapy. -Anastrozole started on 11/28/2019. -She finished radiation therapy to the left breast. - Anastrozole changed to tamoxifen secondary to osteoporosis.   2.  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   3.   Osteoporosis: -DEXA scan on March 03, 2019 shows T score -3.4. -She was started on Prolia which was given on 03/14/2020. -She is not interested in any additional Prolia secondary to cost.   PLAN:  1.  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 - Physical examination today shows left breast lumpectomy scar around areola within normal limits with mild lymphedema in the dependent part of the left breast. - No palpable masses or adenopathy. - We will schedule mammogram after 11/06/2021. - Reviewed labs from 08/05/2021 which showed normal LFTs. - RTC 6 months for follow-up with repeat labs and exam.  2.  Osteoporosis: - She is taking Fosamax 70 mg weekly. - Calcium is within normal limits.   3.  Vitamin D deficiency: - She is taking vitamin D every Sunday. - Recommend taking vitamin D 1000 units daily as her level is 27.  Breast Cancer therapy associated bone loss: I have recommended calcium, Vitamin D and weight bearing exercises.  Orders placed this encounter:  Orders Placed This Encounter  Procedures   MM DIAG BREAST TOMO BILATERAL    The patient has a good understanding of the overall plan. She agrees with it. She will call with any problems that may develop before the next visit here.  Derek Jack, MD [place] [phone]   I, Thana Ates, am acting as a scribe for Dr. Derek Jack.  I, Derek Jack MD, have reviewed the above documentation for accuracy and completeness, and I agree with the above.

## 2021-08-12 NOTE — Patient Instructions (Addendum)
[place] at [facility] Discharge Instructions   You were seen and examined today by Dr. Delton Coombes.  He reviewed your lab work with you.  All results were normal/stable except your vitamin D is low.  Continue the large dose of vitamin D every Sunday, but add Vitamin D 1000 units daily.  Mammogram as scheduled in April.  Return as scheduled in 6 months.    Thank you for choosing [place] at [facility] to provide your oncology and hematology care.  To afford each patient quality time with our provider, please arrive at least 15 minutes before your scheduled appointment time.   If you have a lab appointment with the [facility] please come in thru the Main Entrance and check in at the main information desk.  You need to re-schedule your appointment should you arrive 10 or more minutes late.  We strive to give you quality time with our providers, and arriving late affects you and other patients whose appointments are after yours.  Also, if you no show three or more times for appointments you may be dismissed from the clinic at the providers discretion.     Again, thank you for choosing [facility].  Our hope is that these requests will decrease the amount of time that you wait before being seen by our physicians.       _____________________________________________________________  Should you have questions after your visit to [facility], please contact our office at [phone] and follow the prompts.  Our office hours are 8:00 a.m. and 4:30 p.m. Monday - Friday.  Please note that voicemails left after 4:00 p.m. may not be returned until the following business day.  We are closed weekends and major holidays.  You do have access to a nurse 24-7, just call the main number to the clinic [phone] and do not press any options, hold on the line and a nurse will answer the phone.    For prescription refill requests,  have your pharmacy contact our office and allow 72 hours.    Due to Covid, you will need to wear a mask upon entering the hospital. If you do not have a mask, a mask will be given to you at the Main Entrance upon arrival. For doctor visits, patients may have 1 support person age 52 or older with them. For treatment visits, patients can not have anyone with them due to social distancing guidelines and our immunocompromised population.

## 2021-08-13 ENCOUNTER — Other Ambulatory Visit (HOSPITAL_COMMUNITY): Payer: Self-pay | Admitting: *Deleted

## 2021-08-13 DIAGNOSIS — C50412 Malignant neoplasm of upper-outer quadrant of left female breast: Secondary | ICD-10-CM

## 2021-08-13 DIAGNOSIS — Z170 Estrogen receptor positive status [ER+]: Secondary | ICD-10-CM

## 2021-09-06 ENCOUNTER — Ambulatory Visit (INDEPENDENT_AMBULATORY_CARE_PROVIDER_SITE_OTHER): Payer: Commercial Managed Care - PPO | Admitting: Adult Health

## 2021-09-06 ENCOUNTER — Other Ambulatory Visit: Payer: Self-pay

## 2021-09-06 ENCOUNTER — Other Ambulatory Visit (HOSPITAL_COMMUNITY)
Admission: RE | Admit: 2021-09-06 | Discharge: 2021-09-06 | Disposition: A | Discharge status: Home / Self Care | Payer: Commercial Managed Care - PPO | Source: Ambulatory Visit | Attending: Adult Health | Admitting: Adult Health

## 2021-09-06 ENCOUNTER — Encounter: Payer: Self-pay | Admitting: Adult Health

## 2021-09-06 VITALS — BP 127/73 | HR 82 | Ht 64.0 in | Wt 152.5 lb

## 2021-09-06 DIAGNOSIS — Z853 Personal history of malignant neoplasm of breast: Secondary | ICD-10-CM

## 2021-09-06 DIAGNOSIS — Z01419 Encounter for gynecological examination (general) (routine) without abnormal findings: Secondary | ICD-10-CM

## 2021-09-06 DIAGNOSIS — Z1211 Encounter for screening for malignant neoplasm of colon: Secondary | ICD-10-CM | POA: Diagnosis not present

## 2021-09-06 DIAGNOSIS — Z780 Asymptomatic menopausal state: Secondary | ICD-10-CM | POA: Diagnosis not present

## 2021-09-06 LAB — HEMOCCULT GUIAC POC 1CARD (OFFICE): Fecal Occult Blood, POC: NEGATIVE

## 2021-09-06 NOTE — Progress Notes (Signed)
Patient ID: Monica Russell, female   DOB: 07-16-1960, 61 y.o.   MRN: [id number] History of Present Illness: Monica Russell is a 61 year old white female,married, PM, in for a well woman gyn exam and pap. She had left breast cancer and is on tamoxifen. PCP is Dr Sallee Lange.   Current Medications, Allergies, Past Medical History, Past Surgical History, Family History and Social History were reviewed in [employer] record.     Review of Systems:  Patient denies any headaches, hearing loss, fatigue, blurred vision, shortness of breath, chest pain, abdominal pain, problems with bowel movements, urination, or intercourse.(Not active). No joint pain or mood swings.  Denies any vaginal bleeding.   Physical Exam:BP 127/73 (BP Location: Left Arm, Patient Position: Sitting, Cuff Size: Normal)    Pulse 82    Ht 5\' 4"  (1.626 m)    Wt 152 lb 8 oz (69.2 kg)    BMI 26.18 kg/m   General:  Well developed, well nourished, no acute distress Skin:  Warm and dry Neck:  Midline trachea, normal thyroid, good ROM, no lymphadenopathy,no carotid bruits heard Lungs; Clear to auscultation bilaterally Breast:  No dominant palpable mass, retraction, or nipple discharge Cardiovascular: Regular rate and rhythm Abdomen:  Soft, non tender, no hepatosplenomegaly Pelvic:  External genitalia is normal in appearance, no lesions.  The vagina is pale with loss of moisture and rugae. Urethra has no lesions or masses. The cervix is smooth, pap with HR HPV genotyping performed.  Uterus is felt to be normal size, shape, and contour.  No adnexal masses or tenderness noted.Bladder is non tender, no masses felt. Rectal: Good sphincter tone, no polyps, + hemorrhoids felt.  Hemoccult negative. Extremities/musculoskeletal:  No swelling or varicosities noted, no clubbing or cyanosis Psych:  No mood changes, alert and cooperative,seems happy AA is 1 Fall risk is low Depression screen [facility] 2/9 09/06/2021 01/08/2021 09/21/2019   Decreased Interest 1 0 0  Down, Depressed, Hopeless 0 0 0  PHQ - 2 Score 1 0 0  Altered sleeping 0 - -  Tired, decreased energy 0 - -  Change in appetite 0 - -  Feeling bad or failure about yourself  0 - -  Trouble concentrating 0 - -  Moving slowly or fidgety/restless 0 - -  Suicidal thoughts 0 - -  PHQ-9 Score 1 - -  Difficult doing work/chores - - -    GAD 7 : Generalized Anxiety Score 09/06/2021  Nervous, Anxious, on Edge 1  Control/stop worrying 0  Worry too much - different things 1  Trouble relaxing 1  Restless 1  Easily annoyed or irritable 1  Afraid - awful might happen 0  Total GAD 7 Score 5      Upstream - 09/06/21 1238       Pregnancy Intention Screening   Does the patient want to become pregnant in the next year? N/A    Does the patient's partner want to become pregnant in the next year? N/A    Would the patient like to discuss contraceptive options today? N/A      Contraception Wrap Up   Current Method No Method - Other Reason   postmenopausal   End Method No Method - Other Reason   postmenopausal   Contraception Counseling Provided No             Examination chaperoned by Levy Pupa LPN  Impression and Plan: 1. Encounter for gynecological examination with Papanicolaou smear of cervix Pap sent Physical  next year with PCP Pap in 3 years if normal  Labs with PCP and Dr Raliegh Ip Mammogram yearly Colonoscopy per GI  2. Encounter for screening fecal occult blood testing  3. Postmenopausal  4. History of left breast cancer On tamoxifen

## 2021-09-10 ENCOUNTER — Ambulatory Visit (INDEPENDENT_AMBULATORY_CARE_PROVIDER_SITE_OTHER): Payer: Commercial Managed Care - PPO | Admitting: Gastroenterology

## 2021-09-11 LAB — CYTOLOGY - PAP
Comment: NEGATIVE
Diagnosis: NEGATIVE
High risk HPV: NEGATIVE

## 2021-09-20 ENCOUNTER — Other Ambulatory Visit: Payer: Self-pay | Admitting: Adult Health

## 2021-09-20 MED ORDER — SULFAMETHOXAZOLE-TRIMETHOPRIM 800-160 MG PO TABS: 1.0000 | ORAL_TABLET | Freq: Two times a day (BID) | ORAL | 0 refills | Status: DC

## 2021-09-20 NOTE — Progress Notes (Signed)
Will rx septra ds  

## 2021-11-07 ENCOUNTER — Encounter (INDEPENDENT_AMBULATORY_CARE_PROVIDER_SITE_OTHER): Payer: Self-pay

## 2021-11-08 ENCOUNTER — Ambulatory Visit
Admission: RE | Admit: 2021-11-08 | Discharge: 2021-11-08 | Disposition: A | Discharge status: Home / Self Care | Payer: Commercial Managed Care - PPO | Source: Ambulatory Visit | Attending: Nurse Practitioner | Admitting: Nurse Practitioner

## 2021-11-08 VITALS — BP 146/78 | HR 80 | Temp 97.9°F | Resp 18

## 2021-11-08 DIAGNOSIS — T7840XA Allergy, unspecified, initial encounter: Secondary | ICD-10-CM | POA: Diagnosis not present

## 2021-11-08 MED ORDER — PREDNISONE 20 MG PO TABS
20.0000 mg | ORAL_TABLET | Freq: Every day | ORAL | 0 refills | Status: AC
Start: 2021-11-08 — End: 2021-11-13

## 2021-11-08 NOTE — Discharge Instructions (Addendum)
Take medication as prescribed. ?Cool compresses to the affected areas to help with itching and redness. ?Stop taking Vitamin D until you have been seen by your PCP. ?Continue Benadryl at bedtime until symptoms improve. ?Follow-up as needed or if symptoms do not improve. ?

## 2021-11-08 NOTE — ED Triage Notes (Signed)
Pt reports redness, itching and swelling in the left upper eyelid x 1 week; drainage in left eye x 1 day.  ?

## 2021-11-08 NOTE — ED Provider Notes (Signed)
[facility]    CSN: [id number] Arrival date & time: 11/08/21  1252      History   Chief Complaint Chief Complaint  Patient presents with   Allergic Reaction    Entered by patient   Appointment    1300    HPI Monica Russell is a 61 y.o. female.   The patient is a 61 year old female who presents with swelling to the left eye and redness on her face.  Symptoms have been present the left eye for 1 week.  She also notes that she has had some drainage in the left eye today.  She also notes that she has redness on her cheeks.  Patient states that she also has itching to her face.  She states that she had a reaction to cholecalciferol in the past.  States that she took an over-the-counter dose this past week prior to her symptoms starting.  Patient also states that she has been eating and moderate amount of strawberries.  She denies shortness of breath, difficulty breathing, throat swelling, chest tightness, worsening rash or itching.  Patient states that she has been taking Benadryl which has helped her symptoms.  She states that the left eye swelling has improved since she woke up this morning.  The history is provided by the patient.   Past Medical History:  Diagnosis Date   Breast cancer (HCC)    High cholesterol    IBS (irritable bowel syndrome)    Renal cell cancer [facility])     Patient Active Problem List   Diagnosis Date Noted   Encounter for screening fecal occult blood testing 09/06/2021   History of left breast cancer 09/06/2021   Postmenopausal 09/06/2021   Itching in the vaginal area 05/20/2021   Osteoporosis 03/05/2020   Malignant neoplasm of upper-outer quadrant of left female breast (HCC) 09/15/2019   Pain of left breast 08/17/2019   Mass of upper outer quadrant of left breast 08/17/2019   Encounter for gynecological examination with Papanicolaou smear of cervix 08/11/2017   Rectal pressure 08/11/2017   Prediabetes 10/29/2015   Personal history of  tobacco use, presenting hazards to health 04/18/2015   Vitamin D deficiency 07/04/2013   Hyperlipemia 07/01/2013   Rectal itching 08/13/2012   GERD (gastroesophageal reflux disease) 04/07/2012   Diarrhea 04/07/2012   Diverticulitis 04/07/2012    Past Surgical History:  Procedure Laterality Date   BREAST LUMPECTOMY WITH RADIOACTIVE SEED AND SENTINEL LYMPH NODE BIOPSY Left 10/31/2019   Procedure: LEFT BREAST LUMPECTOMY X 2 WITH RADIOACTIVE SEED AND SENTINEL LYMPH NODE BIOPSY, LEFT AXILLARY SENTINEL NODE RADIOACTIVE SEED GUIDED EXCISION;  Surgeon: Emelia Loron, MD;  Location: [facility];  Service: General;  Laterality: Left;   CATARACT EXTRACTION Bilateral    06/2021   CHOLECYSTECTOMY     COLONOSCOPY  04/28/2012   Rehman: scattered diverticula throughout colon, colonic anastomosis at 18cm from anal margin, small external hemorrhoids   PARTIAL COLECTOMY     for diverticulitis   PARTIAL NEPHRECTOMY     11 yrs ago for a renal carcinoma, right    OB History     Gravida  1   Para  1   Term      Preterm      AB      Living  1      SAB      IAB      Ectopic      Multiple      Live Births  Home Medications    Prior to Admission medications   Medication Sig Start Date End Date Taking? Authorizing Provider  predniSONE (DELTASONE) 20 MG tablet Take 1 tablet (20 mg total) by mouth daily with breakfast for 5 days. 11/08/21 11/13/21 Yes Leath-Warren, Sadie Haber, NP  albuterol (PROVENTIL) (2.5 MG/3ML) 0.083% nebulizer solution Take 3 mLs (2.5 mg total) by nebulization every 6 (six) hours as needed for wheezing or shortness of breath. 01/04/21   Wurst, [place], PA-C  alendronate (FOSAMAX) 70 MG tablet Take 1 tablet (70 mg total) by mouth once a week. Take with a full glass of water on an empty stomach. 11/21/20   Doreatha Massed, MD  Cholecalciferol (VITAMIN D3) 50 MCG (2000 UT) CAPS Take by mouth. 1200 mg per day. 11/21/20   Doreatha Massed, MD  hydrocortisone (ANUSOL-HC) 25 MG suppository Place 1 suppository (25 mg total) rectally 2 (two) times daily as needed for hemorrhoids or anal itching. 07/16/21   Carlan, Chelsea L, NP  pantoprazole (PROTONIX) 40 MG tablet Take 1 tablet (40 mg total) by mouth daily. Patient taking differently: Take 40 mg by mouth. 06/20/21   Babs Sciara, MD  Respiratory Therapy Supplies (NEBULIZER) DEVI Use as directed 01/04/21   Alvino Chapel [place], PA-C  Respiratory Therapy Supplies (NEBULIZER/TUBING/MOUTHPIECE) KIT Use as directed 01/04/21   Alvino Chapel, [place], PA-C  rosuvastatin (CRESTOR) 20 MG tablet Take 1 tablet (20 mg total) by mouth daily. 06/20/21   Babs Sciara, MD  sulfamethoxazole-trimethoprim (BACTRIM DS) 800-160 MG tablet Take 1 tablet by mouth 2 (two) times daily. Take 1 bid 09/20/21   Adline Potter, NP  tamoxifen (NOLVADEX) 20 MG tablet TAKE 1 TABLET BY MOUTH DAILY 05/15/21   Doreatha Massed, MD  triamcinolone ointment (KENALOG) 0.5 % Apply 1 application topically 2 (two) times daily. 05/20/21   Adline Potter, NP    Family History Family History  Problem Relation Age of Onset   Heart disease Maternal Grandmother    Diabetes Maternal Grandmother    Heart disease Maternal Grandfather    Dementia Maternal Grandfather    Hypertension Mother    Mental illness Mother    Mental illness Brother     Social History Social History   Tobacco Use   Smoking status: Every Day    Packs/day: 0.50    Years: 40.00    Pack years: 20.00    Types: Cigarettes   Smokeless tobacco: Never   Tobacco comments:    10-12 a day  Vaping Use   Vaping Use: Never used  Substance Use Topics   Alcohol use: No    Alcohol/week: 0.0 standard drinks    Comment: rarely   Drug use: No     Allergies   Penicillins   Review of Systems Review of Systems  Constitutional: Negative.   HENT: Negative.    Eyes:        Left eye swelling  Respiratory: Negative.    Cardiovascular: Negative.    Skin: Negative.   Psychiatric/Behavioral: Negative.      Physical Exam Triage Vital Signs ED Triage Vitals  Enc Vitals Group     BP 11/08/21 1333 (!) 146/78     Pulse Rate 11/08/21 1333 80     Resp 11/08/21 1333 18     Temp 11/08/21 1333 97.9 F (36.6 C)     Temp Source 11/08/21 1333 Oral     SpO2 11/08/21 1333 95 %     Weight --      Height --  Head Circumference --      Peak Flow --      Pain Score 11/08/21 1332 0     Pain Loc --      Pain Edu? --      Excl. in GC? --    No data found.  Updated Vital Signs BP (!) 146/78 (BP Location: Right Arm)   Pulse 80   Temp 97.9 F (36.6 C) (Oral)   Resp 18   SpO2 95%   Visual Acuity Right Eye Distance:   Left Eye Distance:   Bilateral Distance:    Right Eye Near:   Left Eye Near:    Bilateral Near:     Physical Exam Vitals reviewed.  Constitutional:      Appearance: Normal appearance.  HENT:     Head: Normocephalic and atraumatic.     Right Ear: Tympanic membrane, ear canal and external ear normal.     Left Ear: Tympanic membrane, ear canal and external ear normal.     Nose: Nose normal.     Mouth/Throat:     Mouth: Mucous membranes are moist.  Eyes:     General: Vision grossly intact.        Left eye: No foreign body, discharge or hordeolum.     Extraocular Movements: Extraocular movements intact.     Left eye: Normal extraocular motion and no nystagmus.     Conjunctiva/sclera: Conjunctivae normal.     Left eye: Left conjunctiva is not injected. No chemosis.    Pupils: Pupils are equal, round, and reactive to light.     Comments: Swelling to left upper and lower lid.  There is no drainage present.  Pulmonary:     Effort: Pulmonary effort is normal.  Musculoskeletal:     Cervical back: Normal range of motion.  Skin:    General: Skin is warm and dry.     Capillary Refill: Capillary refill takes less than 2 seconds.     Findings: Rash present. Rash is macular.     Comments: Maculopapular rash to the  skin which overlies the zygomatic bone bilaterally.  Area is erythematous.  No warmth, or swelling noted.  Neurological:     General: No focal deficit present.     Mental Status: She is alert and oriented to person, place, and time.  Psychiatric:        Mood and Affect: Mood normal.        Behavior: Behavior normal.     UC Treatments / Results  Labs (all labs ordered are listed, but only abnormal results are displayed) Labs Reviewed - No data to display  EKG   Radiology No results found.  Procedures Procedures (including critical care time)  Medications Ordered in UC Medications - No data to display  Initial Impression / Assessment and Plan / UC Course  I have reviewed the triage vital signs and the nursing notes.  Pertinent labs & imaging results that were available during my care of the patient were reviewed by me and considered in my medical decision making (see chart for details).  The patient is a 61 year old female who presents with rash on her bilateral cheeks and swelling to the left eye.  Symptoms have been present for the past week, with drainage in the left eye noted over the past day.  On exam, the patient does have a maculopapular rash to the skin overlying the zygomatic bones bilaterally.  The area is erythematous, but is not warm to touch.  Her  left eye has some mild swelling to the upper and lower lid.  Cannot determine the cause of the patient's reaction whether it be the vitamin D or the strawberries.  Patient advised to refrain from eating or having both of these items.  Will prescribe the patient a short course of prednisone to see if this help with her inflammation and itching.  Patient advised to continue the Benadryl at bedtime.  Differential diagnoses include shingles, eczema, psoriasis, or an allergic reaction which is similar to a contact dermatitis.  Patient was advised to avoid these triggers.  Patient was advised to follow-up if symptoms do not  improve. Final Clinical Impressions(s) / UC Diagnoses   Final diagnoses:  Allergic reaction, initial encounter     Discharge Instructions      Take medication as prescribed. Cool compresses to the affected areas to help with itching and redness. Stop taking Vitamin D until you have been seen by your PCP. Continue Benadryl at bedtime until symptoms improve. Follow-up as needed or if symptoms do not improve.     ED Prescriptions     Medication Sig Dispense Auth. Provider   predniSONE (DELTASONE) 20 MG tablet Take 1 tablet (20 mg total) by mouth daily with breakfast for 5 days. 5 tablet Leath-Warren, Sadie Haber, NP      PDMP not reviewed this encounter.   Abran Cantor, NP 11/08/21 2034

## 2021-11-09 ENCOUNTER — Other Ambulatory Visit (INDEPENDENT_AMBULATORY_CARE_PROVIDER_SITE_OTHER): Payer: Self-pay | Admitting: Gastroenterology

## 2021-11-09 MED ORDER — DICYCLOMINE HCL 10 MG PO CAPS: 10.0000 mg | ORAL_CAPSULE | Freq: Two times a day (BID) | ORAL | 0 refills | Status: DC | PRN

## 2021-11-11 ENCOUNTER — Ambulatory Visit (INDEPENDENT_AMBULATORY_CARE_PROVIDER_SITE_OTHER): Payer: Commercial Managed Care - PPO | Admitting: Gastroenterology

## 2021-11-11 ENCOUNTER — Encounter (INDEPENDENT_AMBULATORY_CARE_PROVIDER_SITE_OTHER): Payer: Self-pay | Admitting: Gastroenterology

## 2021-11-11 VITALS — BP 116/51 | HR 83 | Temp 98.3°F | Ht 64.0 in | Wt 151.6 lb

## 2021-11-11 DIAGNOSIS — R112 Nausea with vomiting, unspecified: Secondary | ICD-10-CM | POA: Insufficient documentation

## 2021-11-11 DIAGNOSIS — D649 Anemia, unspecified: Secondary | ICD-10-CM | POA: Insufficient documentation

## 2021-11-11 DIAGNOSIS — R143 Flatulence: Secondary | ICD-10-CM | POA: Diagnosis not present

## 2021-11-11 DIAGNOSIS — R195 Other fecal abnormalities: Secondary | ICD-10-CM | POA: Diagnosis not present

## 2021-11-11 NOTE — Patient Instructions (Signed)
We will check iron studies and celiac panel as well as get you scheduled for EGD for further evaluation of your nausea ?Please continue to take otc gas pills as needed and avoid trigger foods.  ?

## 2021-11-11 NOTE — Progress Notes (Signed)
? ?Referring Provider: Kathyrn Drown, MD ?Primary Care Physician:  Kathyrn Drown, MD ?Primary GI Physician: castaneda ? ?Chief Complaint  ?Patient presents with  ? Gas  ?  Patient concerned about gas. States it smells like sulfur. Wonders if she has celiac disease. Went to urgent care on Friday for allergic reaction to vitamin D.   ? ?HPI:   ?Monica Russell is a 61 y.o. female with past medical history of  breast cancer, high cholesterol, IBS, renal cell cancer. ?  ?Patient presenting today for gas. ? ?Last seen on 07/16/21 as a new patient for internal hemorrhoids.  ?At that time, she c/o feeling as though she was  "sitting on a golf ball" she states that she was having very hard stools and having difficulty passing them,  on exam with PCP, she was told she had some large internal hemorrhoids present. increased her water intake, was doing stool softeners and preparation H suppositories. Felt that symptoms had improved. She does really watch what she eats and avoids fried foods, dairy and other triggers.  still felt the sensation of a "golf ball" but much improved from prior. Denied any rectal bleeding or black stools.She denies any weight loss, abdominal pain, nausea or vomiting. pt advised to start miralax with uptitration as needed at that time, continue to stay well hydrated, anusol suppositories BID, diet high in fruit, veggies and whole grains with plans to proceed with colonoscopy and possible hemorrhoid banding if symptoms did not improve ?  ?Today, she reports that hemorrhoids feel much improved with the use of anusol prescribed at last visit. She states that she recently had a reaction to Vitamin D which caused swelling and irritation around her eyes, hx of the same in the past, has notably low levels of vitamin D and history of osteoporosis for which she is on fosamax.  ? ?She reports that with breads, pastas and rice, she has had loose stools and more gas recently that has been very foul smelling,  reports that stools are also very foul smelling, with sulfur like odor. Symptoms began in February. she often cannot control her gas. She reports she has a lot of rumbling and rolling of her stomach but no abdominal pain. She has been eating a lot of crackers, eliminating artificial sweeteners and other trigger foods such as eggs, dairy and greasy foods. She is also having vomiting with certain foods as well to include breads, pastas, grains and eggs. Has hx of IBS and she previously improved a lot after using dicyclomine and eliminating certain foods in the past. Often has harder stools unless she has eaten trigger food. She is taking famotidine after she eats dinner as she feels that this helps with her vomiting. She is using protonix PRN as she felt she did not need it. Does not really have a lot of reflux unless she is eating citrus foods. She states that she is feeling very bloated as well.  ? ?Notably, hgb has been slightly low around 11.8, though FOBT in feb 2023 was negative. Hgb in similar range about 1 year ago. She denies rectal bleeding, melena, fatigue, sob or dizziness. ? ?Notably, she has a family history of thalassemia though she tested negative for this previously. ?  ?Last Colonoscopy:04/28/12 scattered diverticula throughout colon, colonic anastomosis at 18cm from anal margin, small external hemorrhoids ?Last Endoscopy: n/a ?  ?Recommendations:  ?Repeat colonoscopy oct 2023 ? ?Past Medical History:  ?Diagnosis Date  ? Breast cancer ([place])   ?  High cholesterol   ? IBS (irritable bowel syndrome)   ? Renal cell cancer ([place])   ? ? ?Past Surgical History:  ?Procedure Laterality Date  ? BREAST LUMPECTOMY WITH RADIOACTIVE SEED AND SENTINEL LYMPH NODE BIOPSY Left 10/31/2019  ? Procedure: LEFT BREAST LUMPECTOMY X 2 WITH RADIOACTIVE SEED AND SENTINEL LYMPH NODE BIOPSY, LEFT AXILLARY SENTINEL NODE RADIOACTIVE SEED GUIDED EXCISION;  Surgeon: Rolm Bookbinder, MD;  Location: [place];   Service: General;  Laterality: Left;  ? CATARACT EXTRACTION Bilateral   ? 06/2021  ? CHOLECYSTECTOMY    ? COLONOSCOPY  04/28/2012  ? Rehman: scattered diverticula throughout colon, colonic anastomosis at 18cm from anal margin, small external hemorrhoids  ? PARTIAL COLECTOMY    ? for diverticulitis  ? PARTIAL NEPHRECTOMY    ? 11 yrs ago for a renal carcinoma, right  ? ? ?Current Outpatient Medications  ?Medication Sig Dispense Refill  ? albuterol (PROVENTIL) (2.5 MG/3ML) 0.083% nebulizer solution Take 3 mLs (2.5 mg total) by nebulization every 6 (six) hours as needed for wheezing or shortness of breath. 75 mL 12  ? alendronate (FOSAMAX) 70 MG tablet Take 1 tablet (70 mg total) by mouth once a week. Take with a full glass of water on an empty stomach. 45 tablet 3  ? dicyclomine (BENTYL) 10 MG capsule Take 1 capsule (10 mg total) by mouth every 12 (twelve) hours as needed for spasms (abdominal pain). 60 capsule 0  ? famotidine (PEPCID) 20 MG tablet Take 20 mg by mouth daily. Takes daily after dinner    ? hydrocortisone (ANUSOL-HC) 25 MG suppository Place 1 suppository (25 mg total) rectally 2 (two) times daily as needed for hemorrhoids or anal itching. 24 suppository 2  ? pantoprazole (PROTONIX) 40 MG tablet Take 1 tablet (40 mg total) by mouth daily. (Patient taking differently: Take 40 mg by mouth.) 90 tablet 1  ? predniSONE (DELTASONE) 20 MG tablet Take 1 tablet (20 mg total) by mouth daily with breakfast for 5 days. 5 tablet 0  ? Respiratory Therapy Supplies (NEBULIZER) DEVI Use as directed 1 each 0  ? Respiratory Therapy Supplies (NEBULIZER/TUBING/MOUTHPIECE) KIT Use as directed 1 kit 0  ? rosuvastatin (CRESTOR) 20 MG tablet Take 1 tablet (20 mg total) by mouth daily. 90 tablet 1  ? tamoxifen (NOLVADEX) 20 MG tablet TAKE 1 TABLET BY MOUTH DAILY 120 tablet 2  ? ?No current facility-administered medications for this visit.  ? ? ?Allergies as of 11/11/2021 - Review Complete 11/11/2021  ?Allergen Reaction Noted  ?  Penicillins Hives 04/07/2012  ? Vitamin d analogs Rash 08/17/2019  ? ? ?Family History  ?Problem Relation Age of Onset  ? Heart disease Maternal Grandmother   ? Diabetes Maternal Grandmother   ? Heart disease Maternal Grandfather   ? Dementia Maternal Grandfather   ? Hypertension Mother   ? Mental illness Mother   ? Mental illness Brother   ? ? ?Social History  ? ?Socioeconomic History  ? Marital status: Married  ?  Spouse name: Not on file  ? Number of children: 2  ? Years of education: Not on file  ? Highest education level: Not on file  ?Occupational History  ? Occupation: Retired  ?Tobacco Use  ? Smoking status: Every Day  ?  Packs/day: 0.50  ?  Years: 40.00  ?  Pack years: 20.00  ?  Types: Cigarettes  ?  Passive exposure: Current  ? Smokeless tobacco: Never  ? Tobacco comments:  ?  10-12 a day  ?  Vaping Use  ? Vaping Use: Never used  ?Substance and Sexual Activity  ? Alcohol use: No  ?  Alcohol/week: 0.0 standard drinks  ?  Comment: rarely  ? Drug use: No  ? Sexual activity: Not Currently  ?  Birth control/protection: Post-menopausal  ?Other Topics Concern  ? Not on file  ?Social History Narrative  ? Pt's husband has dementia  ? ?Social Determinants of Health  ? ?Financial Resource Strain: Low Risk   ? Difficulty of Paying Living Expenses: Not hard at all  ?Food Insecurity: No Food Insecurity  ? Worried About Running Out of Food in the Last Year: Never true  ? Ran Out of Food in the Last Year: Never true  ?Transportation Needs: No Transportation Needs  ? Lack of Transportation (Medical): No  ? Lack of Transportation (Non-Medical): No  ?Physical Activity: Insufficiently Active  ? Days of Exercise per Week: 2 days  ? Minutes of Exercise per Session: 30 min  ?Stress: Stress Concern Present  ? Feeling of Stress : To some extent  ?Social Connections: Moderately Integrated  ? Frequency of Communication with Friends and Family: Three times a week  ? Frequency of Social Gatherings with Friends and Family: Once a week  ?  Attends Religious Services: More than 4 times per year  ? Active Member of Clubs or Organizations: No  ? Attends Club or Organization Meetings: Never  ? Marital Status: Married  ? ?Review of systems ?General: n

## 2021-11-11 NOTE — H&P (View-Only) (Signed)
? ?Referring Provider: Luking, Scott A, MD ?Primary Care Physician:  Luking, Scott A, MD ?Primary GI Physician: castaneda ? ?Chief Complaint  ?Patient presents with  ? Gas  ?  Patient concerned about gas. States it smells like sulfur. Wonders if she has celiac disease. Went to urgent care on Friday for allergic reaction to vitamin D.   ? ?HPI:   ?Monica Russell is a 61 y.o. female with past medical history of  breast cancer, high cholesterol, IBS, renal cell cancer. ?  ?Patient presenting today for gas. ? ?Last seen on 07/16/21 as a new patient for internal hemorrhoids.  ?At that time, she c/o feeling as though she was  "sitting on a golf ball" she states that she was having very hard stools and having difficulty passing them,  on exam with PCP, she was told she had some large internal hemorrhoids present. increased her water intake, was doing stool softeners and preparation H suppositories. Felt that symptoms had improved. She does really watch what she eats and avoids fried foods, dairy and other triggers.  still felt the sensation of a "golf ball" but much improved from prior. Denied any rectal bleeding or black stools.She denies any weight loss, abdominal pain, nausea or vomiting. pt advised to start miralax with uptitration as needed at that time, continue to stay well hydrated, anusol suppositories BID, diet high in fruit, veggies and whole grains with plans to proceed with colonoscopy and possible hemorrhoid banding if symptoms did not improve ?  ?Today, she reports that hemorrhoids feel much improved with the use of anusol prescribed at last visit. She states that she recently had a reaction to Vitamin D which caused swelling and irritation around her eyes, hx of the same in the past, has notably low levels of vitamin D and history of osteoporosis for which she is on fosamax.  ? ?She reports that with breads, pastas and rice, she has had loose stools and more gas recently that has been very foul smelling,  reports that stools are also very foul smelling, with sulfur like odor. Symptoms began in February. she often cannot control her gas. She reports she has a lot of rumbling and rolling of her stomach but no abdominal pain. She has been eating a lot of crackers, eliminating artificial sweeteners and other trigger foods such as eggs, dairy and greasy foods. She is also having vomiting with certain foods as well to include breads, pastas, grains and eggs. Has hx of IBS and she previously improved a lot after using dicyclomine and eliminating certain foods in the past. Often has harder stools unless she has eaten trigger food. She is taking famotidine after she eats dinner as she feels that this helps with her vomiting. She is using protonix PRN as she felt she did not need it. Does not really have a lot of reflux unless she is eating citrus foods. She states that she is feeling very bloated as well.  ? ?Notably, hgb has been slightly low around 11.8, though FOBT in feb 2023 was negative. Hgb in similar range about 1 year ago. She denies rectal bleeding, melena, fatigue, sob or dizziness. ? ?Notably, she has a family history of thalassemia though she tested negative for this previously. ?  ?Last Colonoscopy:04/28/12 scattered diverticula throughout colon, colonic anastomosis at 18cm from anal margin, small external hemorrhoids ?Last Endoscopy: n/a ?  ?Recommendations:  ?Repeat colonoscopy oct 2023 ? ?Past Medical History:  ?Diagnosis Date  ? Breast cancer (HCC)   ?   High cholesterol   ? IBS (irritable bowel syndrome)   ? Renal cell cancer ([place])   ? ? ?Past Surgical History:  ?Procedure Laterality Date  ? BREAST LUMPECTOMY WITH RADIOACTIVE SEED AND SENTINEL LYMPH NODE BIOPSY Left 10/31/2019  ? Procedure: LEFT BREAST LUMPECTOMY X 2 WITH RADIOACTIVE SEED AND SENTINEL LYMPH NODE BIOPSY, LEFT AXILLARY SENTINEL NODE RADIOACTIVE SEED GUIDED EXCISION;  Surgeon: Rolm Bookbinder, MD;  Location: [facility];   Service: General;  Laterality: Left;  ? CATARACT EXTRACTION Bilateral   ? 06/2021  ? CHOLECYSTECTOMY    ? COLONOSCOPY  04/28/2012  ? Rehman: scattered diverticula throughout colon, colonic anastomosis at 18cm from anal margin, small external hemorrhoids  ? PARTIAL COLECTOMY    ? for diverticulitis  ? PARTIAL NEPHRECTOMY    ? 11 yrs ago for a renal carcinoma, right  ? ? ?Current Outpatient Medications  ?Medication Sig Dispense Refill  ? albuterol (PROVENTIL) (2.5 MG/3ML) 0.083% nebulizer solution Take 3 mLs (2.5 mg total) by nebulization every 6 (six) hours as needed for wheezing or shortness of breath. 75 mL 12  ? alendronate (FOSAMAX) 70 MG tablet Take 1 tablet (70 mg total) by mouth once a week. Take with a full glass of water on an empty stomach. 45 tablet 3  ? dicyclomine (BENTYL) 10 MG capsule Take 1 capsule (10 mg total) by mouth every 12 (twelve) hours as needed for spasms (abdominal pain). 60 capsule 0  ? famotidine (PEPCID) 20 MG tablet Take 20 mg by mouth daily. Takes daily after dinner    ? hydrocortisone (ANUSOL-HC) 25 MG suppository Place 1 suppository (25 mg total) rectally 2 (two) times daily as needed for hemorrhoids or anal itching. 24 suppository 2  ? pantoprazole (PROTONIX) 40 MG tablet Take 1 tablet (40 mg total) by mouth daily. (Patient taking differently: Take 40 mg by mouth.) 90 tablet 1  ? predniSONE (DELTASONE) 20 MG tablet Take 1 tablet (20 mg total) by mouth daily with breakfast for 5 days. 5 tablet 0  ? Respiratory Therapy Supplies (NEBULIZER) DEVI Use as directed 1 each 0  ? Respiratory Therapy Supplies (NEBULIZER/TUBING/MOUTHPIECE) KIT Use as directed 1 kit 0  ? rosuvastatin (CRESTOR) 20 MG tablet Take 1 tablet (20 mg total) by mouth daily. 90 tablet 1  ? tamoxifen (NOLVADEX) 20 MG tablet TAKE 1 TABLET BY MOUTH DAILY 120 tablet 2  ? ?No current facility-administered medications for this visit.  ? ? ?Allergies as of 11/11/2021 - Review Complete 11/11/2021  ?Allergen Reaction Noted  ?  Penicillins Hives 04/07/2012  ? Vitamin d analogs Rash 08/17/2019  ? ? ?Family History  ?Problem Relation Age of Onset  ? Heart disease Maternal Grandmother   ? Diabetes Maternal Grandmother   ? Heart disease Maternal Grandfather   ? Dementia Maternal Grandfather   ? Hypertension Mother   ? Mental illness Mother   ? Mental illness Brother   ? ? ?Social History  ? ?Socioeconomic History  ? Marital status: Married  ?  Spouse name: Not on file  ? Number of children: 2  ? Years of education: Not on file  ? Highest education level: Not on file  ?Occupational History  ? Occupation: Retired  ?Tobacco Use  ? Smoking status: Every Day  ?  Packs/day: 0.50  ?  Years: 40.00  ?  Pack years: 20.00  ?  Types: Cigarettes  ?  Passive exposure: Current  ? Smokeless tobacco: Never  ? Tobacco comments:  ?  10-12 a day  ?  Vaping Use  ? Vaping Use: Never used  ?Substance and Sexual Activity  ? Alcohol use: No  ?  Alcohol/week: 0.0 standard drinks  ?  Comment: rarely  ? Drug use: No  ? Sexual activity: Not Currently  ?  Birth control/protection: Post-menopausal  ?Other Topics Concern  ? Not on file  ?Social History Narrative  ? Pt's husband has dementia  ? ?Social Determinants of Health  ? ?Financial Resource Strain: Low Risk   ? Difficulty of Paying Living Expenses: Not hard at all  ?Food Insecurity: No Food Insecurity  ? Worried About Running Out of Food in the Last Year: Never true  ? Ran Out of Food in the Last Year: Never true  ?Transportation Needs: No Transportation Needs  ? Lack of Transportation (Medical): No  ? Lack of Transportation (Non-Medical): No  ?Physical Activity: Insufficiently Active  ? Days of Exercise per Week: 2 days  ? Minutes of Exercise per Session: 30 min  ?Stress: Stress Concern Present  ? Feeling of Stress : To some extent  ?Social Connections: Moderately Integrated  ? Frequency of Communication with Friends and Family: Three times a week  ? Frequency of Social Gatherings with Friends and Family: Once a week  ?  Attends Religious Services: More than 4 times per year  ? Active Member of Clubs or Organizations: No  ? Attends Club or Organization Meetings: Never  ? Marital Status: Married  ? ?Review of systems ?General: n

## 2021-11-12 ENCOUNTER — Ambulatory Visit (HOSPITAL_COMMUNITY)
Admission: RE | Admit: 2021-11-12 | Discharge: 2021-11-12 | Disposition: A | Discharge status: Home / Self Care | Payer: Commercial Managed Care - PPO | Source: Ambulatory Visit | Attending: Hematology | Admitting: Hematology

## 2021-11-12 DIAGNOSIS — C50412 Malignant neoplasm of upper-outer quadrant of left female breast: Secondary | ICD-10-CM | POA: Insufficient documentation

## 2021-11-12 DIAGNOSIS — Z170 Estrogen receptor positive status [ER+]: Secondary | ICD-10-CM | POA: Insufficient documentation

## 2021-11-12 LAB — CELIAC DISEASE PANEL
(tTG) Ab, IgA: <1 U/mL
(tTG) Ab, IgG: <1 U/mL
Gliadin IgA: 1.8 U/mL
Gliadin IgG: <1 U/mL
Immunoglobulin A: 206 mg/dL (ref 70–320)

## 2021-11-12 LAB — IRON,TIBC AND FERRITIN PANEL
%SAT: 20 % (calc) (ref 16–45)
Ferritin: 68 ng/mL (ref 16–288)
Iron: 62 ug/dL (ref 45–160)
TIBC: 306 mcg/dL (calc) (ref 250–450)

## 2021-11-15 ENCOUNTER — Telehealth (INDEPENDENT_AMBULATORY_CARE_PROVIDER_SITE_OTHER): Payer: Self-pay | Admitting: *Deleted

## 2021-11-15 NOTE — Telephone Encounter (Signed)
[facility] on [address] ?

## 2021-11-15 NOTE — Telephone Encounter (Signed)
Patient called back to check on getting EGD scheduled. She thinks she has an ulcer. States dicyclomine is not helping and would like to see if something else can be prescribed.  ?

## 2021-11-15 NOTE — Telephone Encounter (Signed)
Patient can go EGD any day except next Friday is not good.  Would like next available to get in as soon as possible.  ?

## 2021-11-18 NOTE — Telephone Encounter (Signed)
Called and discussed with patient per [facility] -  she has protonix that she was using on PRN basis, she can start taking that daily as PPI would be the treatment if this is an ulcer. If she needs a refill of this, pleae let me know.  Darius Bump is aware and will get her scheduled for EGD where there is an opening on schedule, will likely be atleast 2-3 weeks given how far Endo schedule is booked out.   ?Patient did not need a refill. She had some but was not taking. Will start taking and she is aware of all.  ?

## 2021-11-19 ENCOUNTER — Other Ambulatory Visit (INDEPENDENT_AMBULATORY_CARE_PROVIDER_SITE_OTHER): Payer: Self-pay

## 2021-11-19 DIAGNOSIS — R112 Nausea with vomiting, unspecified: Secondary | ICD-10-CM

## 2021-11-20 ENCOUNTER — Encounter (HOSPITAL_COMMUNITY)
Admission: RE | Disposition: A | Discharge status: Home / Self Care | Payer: Self-pay | Source: Home / Self Care | Attending: Gastroenterology

## 2021-11-20 ENCOUNTER — Ambulatory Visit (HOSPITAL_COMMUNITY): Payer: Commercial Managed Care - PPO | Admitting: Anesthesiology

## 2021-11-20 ENCOUNTER — Encounter (HOSPITAL_COMMUNITY): Payer: Self-pay | Admitting: Gastroenterology

## 2021-11-20 ENCOUNTER — Ambulatory Visit (HOSPITAL_BASED_OUTPATIENT_CLINIC_OR_DEPARTMENT_OTHER): Payer: Commercial Managed Care - PPO | Admitting: Anesthesiology

## 2021-11-20 ENCOUNTER — Other Ambulatory Visit: Payer: Self-pay

## 2021-11-20 ENCOUNTER — Ambulatory Visit (HOSPITAL_COMMUNITY)
Admission: RE | Admit: 2021-11-20 | Discharge: 2021-11-20 | Disposition: A | Discharge status: Home / Self Care | Payer: Commercial Managed Care - PPO | Attending: Gastroenterology | Admitting: Gastroenterology

## 2021-11-20 DIAGNOSIS — R449 Unspecified symptoms and signs involving general sensations and perceptions: Secondary | ICD-10-CM | POA: Diagnosis not present

## 2021-11-20 DIAGNOSIS — K648 Other hemorrhoids: Secondary | ICD-10-CM | POA: Insufficient documentation

## 2021-11-20 DIAGNOSIS — E7800 Pure hypercholesterolemia, unspecified: Secondary | ICD-10-CM | POA: Insufficient documentation

## 2021-11-20 DIAGNOSIS — E559 Vitamin D deficiency, unspecified: Secondary | ICD-10-CM | POA: Insufficient documentation

## 2021-11-20 DIAGNOSIS — R112 Nausea with vomiting, unspecified: Secondary | ICD-10-CM

## 2021-11-20 DIAGNOSIS — K449 Diaphragmatic hernia without obstruction or gangrene: Secondary | ICD-10-CM | POA: Insufficient documentation

## 2021-11-20 DIAGNOSIS — Z853 Personal history of malignant neoplasm of breast: Secondary | ICD-10-CM | POA: Insufficient documentation

## 2021-11-20 DIAGNOSIS — K219 Gastro-esophageal reflux disease without esophagitis: Secondary | ICD-10-CM | POA: Insufficient documentation

## 2021-11-20 DIAGNOSIS — Z85528 Personal history of other malignant neoplasm of kidney: Secondary | ICD-10-CM | POA: Diagnosis not present

## 2021-11-20 DIAGNOSIS — K589 Irritable bowel syndrome without diarrhea: Secondary | ICD-10-CM | POA: Diagnosis not present

## 2021-11-20 DIAGNOSIS — K2289 Other specified disease of esophagus: Secondary | ICD-10-CM | POA: Diagnosis not present

## 2021-11-20 DIAGNOSIS — F17210 Nicotine dependence, cigarettes, uncomplicated: Secondary | ICD-10-CM | POA: Diagnosis not present

## 2021-11-20 HISTORY — PX: BIOPSY: SHX5522

## 2021-11-20 HISTORY — PX: ESOPHAGOGASTRODUODENOSCOPY (EGD) WITH PROPOFOL: SHX5813

## 2021-11-20 SURGERY — ESOPHAGOGASTRODUODENOSCOPY (EGD) WITH PROPOFOL
Anesthesia: General

## 2021-11-20 MED ORDER — LIDOCAINE HCL (CARDIAC) PF 100 MG/5ML IV SOSY
PREFILLED_SYRINGE | INTRAVENOUS | Status: DC | PRN
  Administered 2021-11-20: 50 mg via INTRATRACHEAL

## 2021-11-20 MED ORDER — LACTATED RINGERS IV SOLN: INTRAVENOUS | Status: DC

## 2021-11-20 MED ORDER — PROPOFOL 10 MG/ML IV BOLUS
INTRAVENOUS | Status: DC | PRN
  Administered 2021-11-20: 20 mg via INTRAVENOUS
  Administered 2021-11-20: 70 mg via INTRAVENOUS

## 2021-11-20 NOTE — Op Note (Signed)
[facility] ?Patient Name: Monica Russell ?Procedure Date: 11/20/2021 11:13 AM ?MRN: [id number] ?Date of Birth: Feb 24, 1961 ?Attending MD: Maylon Peppers ,  ?CSN: [id number] ?Age: 61 ?Admit Type: Outpatient ?Procedure:                Upper GI endoscopy ?Indications:              Nausea with vomiting ?Providers:                Maylon Peppers, Lurline Del, RN, Wynonia Musty  ?                          Tech, Technician ?Referring MD:              ?Medicines:                Monitored Anesthesia Care ?Complications:            No immediate complications. ?Estimated Blood Loss:     Estimated blood loss: none. ?Procedure:                Pre-Anesthesia Assessment: ?                          - Prior to the procedure, a History and Physical  ?                          was performed, and patient medications, allergies  ?                          and sensitivities were reviewed. The patient's  ?                          tolerance of previous anesthesia was reviewed. ?                          - The risks and benefits of the procedure and the  ?                          sedation options and risks were discussed with the  ?                          patient. All questions were answered and informed  ?                          consent was obtained. ?                          - ASA Grade Assessment: II - A patient with mild  ?                          systemic disease. ?                          After obtaining informed consent, the endoscope was  ?                          passed under direct vision. Throughout the  ?  procedure, the patient's blood pressure, pulse, and  ?                          oxygen saturations were monitored continuously. The  ?                          GIF-H190 ([id number]) scope was introduced through the  ?                          mouth, and advanced to the second part of duodenum.  ?                          The upper GI endoscopy was accomplished without  ?                           difficulty. The patient tolerated the procedure  ?                          well. ?Scope In: 11:25:57 AM ?Scope Out: 11:32:37 AM ?Total Procedure Duration: 0 hours 6 minutes 40 seconds  ?Findings: ?     Two tongues of salmon-colored mucosa were present. No other visible  ?     abnormalities were present. The maximum longitudinal extent of these  ?     esophageal mucosal changes was 0.5 cm in length. Biopsies were taken  ?     with a cold forceps for histology. ?     A 1 cm hiatal hernia was present. ?     The entire examined stomach was normal. ?     The examined duodenum was normal. Biopsies were taken with a cold  ?     forceps for histology. ?Impression:               - Salmon-colored mucosa suspicious for Barrett's  ?                          esophagus. Biopsied. ?                          - 1 cm hiatal hernia. ?                          - Normal stomach. ?                          - Normal examined duodenum. Biopsied. ?Moderate Sedation: ?     Per Anesthesia Care ?Recommendation:           - Discharge patient to home (ambulatory). ?                          - Await pathology results. ?                          - Start a low FODMAP diet - can ask for a copy at  ?                          the GI clinic ?                          -  If normal biopsies, proceed with gastric emptying  ?                          study. Can also consider SIBO breath test. ?Procedure Code(s):        --- Professional --- ?                          [phone], Esophagogastroduodenoscopy, flexible,  ?                          transoral; with biopsy, single or multiple ?Diagnosis Code(s):        --- Professional --- ?                          K22.8, Other specified diseases of esophagus ?                          K44.9, Diaphragmatic hernia without obstruction or  ?                          gangrene ?                          R11.2, Nausea with vomiting, unspecified ?CPT copyright 2019 American Medical Association. All rights  reserved. ?The codes documented in this report are preliminary and upon coder review may  ?be revised to meet current compliance requirements. ?Maylon Peppers, MD ?Maylon Peppers,  ?11/20/2021 11:44:57 AM ?This report has been signed electronically. ?Number of Addenda: 0 ?

## 2021-11-20 NOTE — Anesthesia Preprocedure Evaluation (Signed)
Anesthesia Evaluation  ?Patient identified by MRN, date of birth, ID band ?Patient awake ? ? ? ?Reviewed: ?Allergy & Precautions, H&P , NPO status , Patient's Chart, lab work & pertinent test results, reviewed documented beta blocker date and time  ? ?Airway ?Mallampati: II ? ?TM Distance: >3 FB ?Neck ROM: full ? ? ? Dental ?no notable dental hx. ? ?  ?Pulmonary ?neg pulmonary ROS, Current Smoker,  ?  ?Pulmonary exam normal ?breath sounds clear to auscultation ? ? ? ? ? ? Cardiovascular ?Exercise Tolerance: Good ?negative cardio ROS ? ? ?Rhythm:regular Rate:Normal ? ? ?  ?Neuro/Psych ?negative neurological ROS ? negative psych ROS  ? GI/Hepatic ?Neg liver ROS, GERD  Medicated,  ?Endo/Other  ?negative endocrine ROS ? Renal/GU ?Renal disease  ?negative genitourinary ?  ?Musculoskeletal ? ? Abdominal ?  ?Peds ? Hematology ? ?(+) Blood dyscrasia, anemia ,   ?Anesthesia Other Findings ? ? Reproductive/Obstetrics ?negative OB ROS ? ?  ? ? ? ? ? ? ? ? ? ? ? ? ? ?  ?  ? ? ? ? ? ? ? ? ?Anesthesia Physical ?Anesthesia Plan ? ?ASA: 2 ? ?Anesthesia Plan: General  ? ?Post-op Pain Management:   ? ?Induction:  ? ?PONV Risk Score and Plan: Propofol infusion ? ?Airway Management Planned:  ? ?Additional Equipment:  ? ?Intra-op Plan:  ? ?Post-operative Plan:  ? ?Informed Consent: I have reviewed the patients History and Physical, chart, labs and discussed the procedure including the risks, benefits and alternatives for the proposed anesthesia with the patient or authorized representative who has indicated his/her understanding and acceptance.  ? ? ? ?Dental Advisory Given ? ?Plan Discussed with: CRNA ? ?Anesthesia Plan Comments:   ? ? ? ? ? ? ?Anesthesia Quick Evaluation ? ?

## 2021-11-20 NOTE — Interval H&P Note (Signed)
History and Physical Interval Note: ? ?11/20/2021 ?10:31 AM ? ?Monica Russell  has presented today for surgery, with the diagnosis of Nausea & Vomiting.  The various methods of treatment have been discussed with the patient and family. After consideration of risks, benefits and other options for treatment, the patient has consented to  Procedure(s) with comments: ?ESOPHAGOGASTRODUODENOSCOPY (EGD) WITH PROPOFOL (N/A) - 1215 as a surgical intervention.  The patient's history has been reviewed, patient examined, no change in status, stable for surgery.  I have reviewed the patient's chart and labs.  Questions were answered to the patient's satisfaction.   ? ? ?[name] ? ? ?

## 2021-11-20 NOTE — Transfer of Care (Signed)
Immediate Anesthesia Transfer of Care Note ? ?Patient: Monica Russell ? ?Procedure(s) Performed: ESOPHAGOGASTRODUODENOSCOPY (EGD) WITH PROPOFOL ?BIOPSY ? ?Patient Location: Endoscopy Unit ? ?Anesthesia Type:MAC ? ?Level of Consciousness: awake, alert , oriented and patient cooperative ? ?Airway & Oxygen Therapy: Patient Spontanous Breathing and Patient connected to nasal cannula oxygen ? ?Post-op Assessment: Report given to RN, Post -op Vital signs reviewed and stable and Patient moving all extremities ? ?Post vital signs: Reviewed and stable ? ?Last Vitals:  ?Vitals Value Taken Time  ?BP    ?Temp 36.4 ?C 11/20/21 1137  ?Pulse    ?Resp 20 11/20/21 1137  ?SpO2 100 % 11/20/21 1137  ? ? ?Last Pain:  ?Vitals:  ? 11/20/21 1137  ?TempSrc: Oral  ?PainSc: 0-No pain  ?   ? ?Patients Stated Pain Goal: 8 (11/20/21 1042) ? ?Complications: No notable events documented. ?

## 2021-11-20 NOTE — Discharge Instructions (Signed)
You are being discharged to home.  ?Start a low FODMAP diet - can ask for a copy at the GI clinic ?We are waiting for your pathology results.  ?If normal biopsies, proceed with gastric emptying study. Can also consider SIBO breath test. ?

## 2021-11-21 NOTE — Anesthesia Postprocedure Evaluation (Signed)
Anesthesia Post Note ? ?Patient: Monica Russell ? ?Procedure(s) Performed: ESOPHAGOGASTRODUODENOSCOPY (EGD) WITH PROPOFOL ?BIOPSY ? ?Patient location during evaluation: Phase II ?Anesthesia Type: General ?Level of consciousness: awake ?Pain management: pain level controlled ?Vital Signs Assessment: post-procedure vital signs reviewed and stable ?Respiratory status: spontaneous breathing and respiratory function stable ?Cardiovascular status: blood pressure returned to baseline and stable ?Postop Assessment: no headache and no apparent nausea or vomiting ?Anesthetic complications: no ?Comments: Late entry ? ? ?No notable events documented. ? ? ?Last Vitals:  ?Vitals:  ? 11/20/21 1042 11/20/21 1137  ?BP: (!) 147/72   ?Pulse: 80   ?Resp: 15 20  ?Temp: 36.4 ?C 36.4 ?C  ?SpO2: 99% 100%  ?  ?Last Pain:  ?Vitals:  ? 11/20/21 1137  ?TempSrc: Oral  ?PainSc: 0-No pain  ? ? ?  ?  ?  ?  ?  ?  ? ?Louann Sjogren ? ? ? ? ?

## 2021-11-22 LAB — SURGICAL PATHOLOGY

## 2021-11-27 ENCOUNTER — Encounter (HOSPITAL_COMMUNITY): Payer: Self-pay | Admitting: Gastroenterology

## 2021-12-11 ENCOUNTER — Other Ambulatory Visit: Payer: Self-pay

## 2021-12-11 ENCOUNTER — Encounter: Payer: Self-pay | Admitting: Family Medicine

## 2021-12-11 DIAGNOSIS — E782 Mixed hyperlipidemia: Secondary | ICD-10-CM

## 2021-12-11 DIAGNOSIS — R7303 Prediabetes: Secondary | ICD-10-CM

## 2021-12-11 DIAGNOSIS — E559 Vitamin D deficiency, unspecified: Secondary | ICD-10-CM

## 2021-12-11 DIAGNOSIS — D649 Anemia, unspecified: Secondary | ICD-10-CM

## 2021-12-11 DIAGNOSIS — Z79899 Other long term (current) drug therapy: Secondary | ICD-10-CM

## 2021-12-11 NOTE — Telephone Encounter (Signed)
Metabolic 7, lipid, liver, vit D, CBC Anemia, vitamin D deficiency, hyperlipidemia, hypertension

## 2021-12-11 NOTE — Telephone Encounter (Signed)
Patient's last labs ordered by office Met7, liver, lipid, and mircroalbumin creatinine, urine ratio.  Please advise. Thank you

## 2021-12-18 LAB — LIPID PANEL
Chol/HDL Ratio: 2.8 ratio (ref 0.0–4.4)
Cholesterol, Total: 172 mg/dL (ref 100–199)
HDL: 61 mg/dL (ref >39)
LDL Chol Calc (NIH): 96 mg/dL (ref 0–99)
Triglycerides: 81 mg/dL (ref 0–149)
VLDL Cholesterol Cal: 15 mg/dL (ref 5–40)

## 2021-12-18 LAB — CBC WITH DIFFERENTIAL/PLATELET
Basophils Absolute: 0.1 10*3/uL (ref 0.0–0.2)
Basos: 1 %
EOS (ABSOLUTE): 0.2 10*3/uL (ref 0.0–0.4)
Eos: 3 %
Hematocrit: 36.2 % (ref 34.0–46.6)
Hemoglobin: 11.9 g/dL (ref 11.1–15.9)
Immature Grans (Abs): 0 10*3/uL (ref 0.0–0.1)
Immature Granulocytes: 0 %
Lymphocytes Absolute: 1.7 10*3/uL (ref 0.7–3.1)
Lymphs: 20 %
MCH: 28.7 pg (ref 26.6–33.0)
MCHC: 32.9 g/dL (ref 31.5–35.7)
MCV: 87 fL (ref 79–97)
Monocytes Absolute: 0.7 10*3/uL (ref 0.1–0.9)
Monocytes: 9 %
Neutrophils Absolute: 5.6 10*3/uL (ref 1.4–7.0)
Neutrophils: 67 %
Platelets: 262 10*3/uL (ref 150–450)
RBC: 4.15 x10E6/uL (ref 3.77–5.28)
RDW: 12.1 % (ref 11.7–15.4)
WBC: 8.3 10*3/uL (ref 3.4–10.8)

## 2021-12-18 LAB — COMPREHENSIVE METABOLIC PANEL
ALT: 7 IU/L (ref 0–32)
AST: 12 IU/L (ref 0–40)
Albumin/Globulin Ratio: 1.9 (ref 1.2–2.2)
Albumin: 4.2 g/dL (ref 3.8–4.8)
Alkaline Phosphatase: 41 IU/L — ABNORMAL LOW (ref 44–121)
BUN/Creatinine Ratio: 8 — ABNORMAL LOW (ref 12–28)
BUN: 5 mg/dL — ABNORMAL LOW (ref 8–27)
Bilirubin Total: <0.2 mg/dL (ref 0.0–1.2)
CO2: 23 mmol/L (ref 20–29)
Calcium: 8.7 mg/dL (ref 8.7–10.3)
Chloride: 100 mmol/L (ref 96–106)
Creatinine, Ser: 0.62 mg/dL (ref 0.57–1.00)
Globulin, Total: 2.2 g/dL (ref 1.5–4.5)
Glucose: 110 mg/dL — ABNORMAL HIGH (ref 70–99)
Potassium: 3.9 mmol/L (ref 3.5–5.2)
Sodium: 137 mmol/L (ref 134–144)
Total Protein: 6.4 g/dL (ref 6.0–8.5)
eGFR: 101 mL/min/{1.73_m2} (ref >59)

## 2021-12-18 LAB — VITAMIN D 25 HYDROXY (VIT D DEFICIENCY, FRACTURES): Vit D, 25-Hydroxy: 25.9 ng/mL — ABNORMAL LOW (ref 30.0–100.0)

## 2021-12-18 LAB — HEPATIC FUNCTION PANEL: Bilirubin, Direct: <0.1 mg/dL (ref 0.00–0.40)

## 2021-12-19 ENCOUNTER — Ambulatory Visit: Payer: Self-pay | Admitting: Family Medicine

## 2021-12-19 ENCOUNTER — Telehealth: Payer: Self-pay

## 2021-12-19 MED ORDER — ROSUVASTATIN CALCIUM 20 MG PO TABS: 20.0000 mg | ORAL_TABLET | Freq: Every day | ORAL | 0 refills | Status: DC

## 2021-12-19 NOTE — Telephone Encounter (Signed)
Encourage patient to contact the pharmacy for refills or they can request refills through [facility]  (Please schedule appointment if patient has not been seen in over a year)    WHAT [place] THIS SENT TO: [facility]  [place] [id number]   MEDICATION NAME & DOSE:rosuvastatin (CRESTOR) 20 MG tablet   NOTES/COMMENTS FROM PATIENT:Pt reschedule per Dr being sick need refill       Front office please notify patient: It takes 48-72 hours to process rx refill requests Ask patient to call pharmacy to ensure rx is ready before heading there.

## 2021-12-19 NOTE — Telephone Encounter (Signed)
Prescription sent electronically to pharmacy. Patient notified. 

## 2022-01-12 ENCOUNTER — Other Ambulatory Visit (HOSPITAL_COMMUNITY): Payer: Self-pay | Admitting: Hematology

## 2022-01-15 ENCOUNTER — Encounter (HOSPITAL_COMMUNITY): Payer: Self-pay | Admitting: Hematology

## 2022-01-23 ENCOUNTER — Encounter: Payer: Self-pay | Admitting: Family Medicine

## 2022-01-23 ENCOUNTER — Ambulatory Visit (INDEPENDENT_AMBULATORY_CARE_PROVIDER_SITE_OTHER): Payer: Commercial Managed Care - PPO | Admitting: Family Medicine

## 2022-01-23 VITALS — BP 123/75 | HR 75 | Temp 97.5°F | Ht 64.0 in | Wt 152.2 lb

## 2022-01-23 DIAGNOSIS — Z87891 Personal history of nicotine dependence: Secondary | ICD-10-CM | POA: Diagnosis not present

## 2022-01-23 DIAGNOSIS — R7303 Prediabetes: Secondary | ICD-10-CM

## 2022-01-23 DIAGNOSIS — E782 Mixed hyperlipidemia: Secondary | ICD-10-CM

## 2022-01-23 DIAGNOSIS — M810 Age-related osteoporosis without current pathological fracture: Secondary | ICD-10-CM

## 2022-01-23 MED ORDER — PANTOPRAZOLE SODIUM 40 MG PO TBEC: 40.0000 mg | DELAYED_RELEASE_TABLET | Freq: Every day | ORAL | 3 refills | Status: DC

## 2022-01-23 MED ORDER — ROSUVASTATIN CALCIUM 20 MG PO TABS: 20.0000 mg | ORAL_TABLET | Freq: Every day | ORAL | 2 refills | Status: DC

## 2022-01-23 NOTE — Progress Notes (Signed)
   Subjective:    Patient ID: Monica Russell, female    DOB: [birth year], 61 y.o.   MRN: [id number]  HPI  Patient here for 6 month follow up Patient follows with oncology regular basis States her overall energy level doing okay Patient does take care of her husband and has underlying Lewy body dementia Patient under fair amount of stress Denies being depressed Does smoke she has been counseled to quit We did discuss low-dose CT scan Information given to her  She does have these intermittent flareups where she has a lot of coughing and shortness of breath and some wheezing but then it gets better we did discuss whether or not she would want to do a CT scan versus pulmonary function test versus pulmonary consultation patient defers currently Review of Systems     Objective:   Physical Exam General-in no acute distress Eyes-no discharge Lungs-respiratory rate normal, CTA CV-no murmurs,RRR Extremities skin warm dry no edema Neuro grossly normal Behavior normal, alert  We reviewed over her labs CBC no anemia Vitamin D is low Liver function is good Cholesterol profile looks good Liver enzymes look good      Assessment & Plan:  1. Osteoporosis without current pathological fracture, unspecified osteoporosis type Continue Fosamax I encourage patient to do 400 IU vitamin D-she states vitamin D caused her to break out but this was a 50,000 unit vitamin D I do not think she will have that problem with the low dose vitamin D if she does she will let us know  2. Personal history of tobacco use, presenting hazards to health Strongly encouraged her to quit smoking.  Also discussed with her low-dose CT scan information given to her she will consider it  3. Prediabetes Prediabetes noted on lab work.  Minimize starches in diet regular physical activity  4. Mixed hyperlipidemia Cholesterol profile good continue current measures watch diet closely-cholesterol medicine was sent into the  pharmacy  GERD-under good control patient states medicine helps her we will continue this

## 2022-02-17 ENCOUNTER — Inpatient Hospital Stay: Payer: Commercial Managed Care - PPO | Attending: Hematology

## 2022-02-17 DIAGNOSIS — M810 Age-related osteoporosis without current pathological fracture: Secondary | ICD-10-CM | POA: Insufficient documentation

## 2022-02-17 DIAGNOSIS — C50412 Malignant neoplasm of upper-outer quadrant of left female breast: Secondary | ICD-10-CM | POA: Insufficient documentation

## 2022-02-17 DIAGNOSIS — Z170 Estrogen receptor positive status [ER+]: Secondary | ICD-10-CM

## 2022-02-17 DIAGNOSIS — Z79810 Long term (current) use of selective estrogen receptor modulators (SERMs): Secondary | ICD-10-CM | POA: Insufficient documentation

## 2022-02-17 LAB — CBC WITH DIFFERENTIAL/PLATELET
Abs Immature Granulocytes: 0.02 10*3/uL (ref 0.00–0.07)
Basophils Absolute: 0.1 10*3/uL (ref 0.0–0.1)
Basophils Relative: 1 %
Eosinophils Absolute: 0.1 10*3/uL (ref 0.0–0.5)
Eosinophils Relative: 2 %
HCT: 37.1 % (ref 36.0–46.0)
Hemoglobin: 12 g/dL (ref 12.0–15.0)
Immature Granulocytes: 0 %
Lymphocytes Relative: 28 %
Lymphs Abs: 2 10*3/uL (ref 0.7–4.0)
MCH: 29.1 pg (ref 26.0–34.0)
MCHC: 32.3 g/dL (ref 30.0–36.0)
MCV: 89.8 fL (ref 80.0–100.0)
Monocytes Absolute: 0.7 10*3/uL (ref 0.1–1.0)
Monocytes Relative: 10 %
Neutro Abs: 4.1 10*3/uL (ref 1.7–7.7)
Neutrophils Relative %: 59 %
Platelets: 242 10*3/uL (ref 150–400)
RBC: 4.13 MIL/uL (ref 3.87–5.11)
RDW: 13.3 % (ref 11.5–15.5)
WBC: 7 10*3/uL (ref 4.0–10.5)
nRBC: 0 % (ref 0.0–0.2)

## 2022-02-17 LAB — COMPREHENSIVE METABOLIC PANEL
ALT: 11 U/L (ref 0–44)
AST: 15 U/L (ref 15–41)
Albumin: 4 g/dL (ref 3.5–5.0)
Alkaline Phosphatase: 36 U/L — ABNORMAL LOW (ref 38–126)
Anion gap: 6 (ref 5–15)
BUN: <5 mg/dL — ABNORMAL LOW (ref 8–23)
CO2: 24 mmol/L (ref 22–32)
Calcium: 8.8 mg/dL — ABNORMAL LOW (ref 8.9–10.3)
Chloride: 107 mmol/L (ref 98–111)
Creatinine, Ser: 0.6 mg/dL (ref 0.44–1.00)
GFR, Estimated: >60 mL/min (ref >60)
Glucose, Bld: 107 mg/dL — ABNORMAL HIGH (ref 70–99)
Potassium: 3.7 mmol/L (ref 3.5–5.1)
Sodium: 137 mmol/L (ref 135–145)
Total Bilirubin: 0.4 mg/dL (ref 0.3–1.2)
Total Protein: 7.5 g/dL (ref 6.5–8.1)

## 2022-02-17 LAB — VITAMIN D 25 HYDROXY (VIT D DEFICIENCY, FRACTURES): Vit D, 25-Hydroxy: 29.06 ng/mL — ABNORMAL LOW (ref 30–100)

## 2022-02-24 ENCOUNTER — Ambulatory Visit (HOSPITAL_COMMUNITY): Payer: Commercial Managed Care - PPO | Admitting: Hematology

## 2022-02-25 ENCOUNTER — Inpatient Hospital Stay (HOSPITAL_BASED_OUTPATIENT_CLINIC_OR_DEPARTMENT_OTHER): Payer: Commercial Managed Care - PPO | Admitting: Hematology

## 2022-02-25 VITALS — BP 141/74 | HR 84 | Temp 98.4°F | Resp 18 | Ht 67.0 in | Wt 154.2 lb

## 2022-02-25 DIAGNOSIS — C50412 Malignant neoplasm of upper-outer quadrant of left female breast: Secondary | ICD-10-CM | POA: Diagnosis not present

## 2022-02-25 DIAGNOSIS — Z170 Estrogen receptor positive status [ER+]: Secondary | ICD-10-CM

## 2022-02-25 NOTE — Patient Instructions (Addendum)
[place] at [facility] Discharge Instructions  You were seen and examined by Dr. Delton Coombes.  You do need to be on Vitamin D, so Dr. Delton Coombes has recommended that you try a different preparation of Vitamin D. Your last mammogram was good.  Otherwise, follow-up as scheduled.  Thank you for choosing [place] at [facility] to provide your oncology and hematology care.  To afford each patient quality time with our provider, please arrive at least 15 minutes before your scheduled appointment time.   If you have a lab appointment with the [facility] please come in thru the Main Entrance and check in at the main information desk.  You need to re-schedule your appointment should you arrive 10 or more minutes late.  We strive to give you quality time with our providers, and arriving late affects you and other patients whose appointments are after yours.  Also, if you no show three or more times for appointments you may be dismissed from the clinic at the providers discretion.     Again, thank you for choosing [facility].  Our hope is that these requests will decrease the amount of time that you wait before being seen by our physicians.       _____________________________________________________________  Should you have questions after your visit to [facility], please contact our office at [phone] and follow the prompts.  Our office hours are 8:00 a.m. and 4:30 p.m. Monday - Friday.  Please note that voicemails left after 4:00 p.m. may not be returned until the following business day.  We are closed weekends and major holidays.  You do have access to a nurse 24-7, just call the main number to the clinic [phone] and do not press any options, hold on the line and a nurse will answer the phone.    For prescription refill requests, have your pharmacy contact our office and allow 72 hours.

## 2022-02-25 NOTE — Progress Notes (Signed)
[facility] [address] [place] [address]   Patient Care Team: Kathyrn Drown, MD as PCP - General (Family Medicine) Donetta Potts, RN as Oncology Nurse Navigator (Oncology) Derek Jack, MD as Medical Oncologist (Oncology)  SUMMARY OF ONCOLOGIC HISTORY: Oncology History  Mass of upper outer quadrant of left breast (Resolved)  08/17/2019 Initial Diagnosis   Mass of upper outer quadrant of left breast   11/11/2019 Genetic Testing        Malignant neoplasm of upper-outer quadrant of left female breast ([place])  09/15/2019 Initial Diagnosis   Malignant neoplasm of upper-outer quadrant of left female breast ([place])   09/15/2019 Cancer Staging   Staging form: Breast, AJCC 8th Edition - Clinical stage from 09/15/2019: Stage IIA (cT2, cN1(f), cM0, G2, ER+, PR+, HER2-) - Signed by Derek Jack, MD on 11/28/2019     CHIEF COMPLIANT: Follow-up of left breast cancer   INTERVAL HISTORY: Monica Russell is a 61 y.o. female seen for follow-up of her left breast cancer.  She is tolerating tamoxifen really well.  No vaginal discharge or spotting.  Appetite and energy levels are 100%.  She reportedly had reaction with rash and eye swelling and stopped taking vitamin D 2 months ago.  Restarted back at the advice of Dr. Wolfgang Phoenix.  REVIEW OF SYSTEMS:   Review of Systems  Constitutional:  Negative for appetite change and fatigue.  Endocrine: Negative for hot flashes.  Musculoskeletal:  Negative for arthralgias.  All other systems reviewed and are negative.   I have reviewed the past medical history, past surgical history, social history and family history with the patient and they are unchanged from previous note.   ALLERGIES:   is allergic to penicillins and vitamin d analogs.   MEDICATIONS:  Current Outpatient Medications  Medication Sig Dispense Refill   albuterol (PROVENTIL) (2.5 MG/3ML) 0.083% nebulizer solution Take 3 mLs (2.5 mg total) by  nebulization every 6 (six) hours as needed for wheezing or shortness of breath. 75 mL 12   alendronate (FOSAMAX) 70 MG tablet TAKE 1 TABLET BY MOUTH ONCE A WEEK, TAKE WITH FULL GLASS OF WATER ON AN EMPTY STOMACH 44 tablet 0   dicyclomine (BENTYL) 10 MG capsule Take 1 capsule (10 mg total) by mouth every 12 (twelve) hours as needed for spasms (abdominal pain). 60 capsule 0   famotidine (PEPCID) 20 MG tablet Take 20 mg by mouth daily before supper.     hydrocortisone (ANUSOL-HC) 25 MG suppository Place 1 suppository (25 mg total) rectally 2 (two) times daily as needed for hemorrhoids or anal itching. 24 suppository 2   pantoprazole (PROTONIX) 40 MG tablet Take 1 tablet (40 mg total) by mouth daily. 90 tablet 3   Respiratory Therapy Supplies (NEBULIZER) DEVI Use as directed 1 each 0   Respiratory Therapy Supplies (NEBULIZER/TUBING/MOUTHPIECE) KIT Use as directed 1 kit 0   rosuvastatin (CRESTOR) 20 MG tablet Take 1 tablet (20 mg total) by mouth daily. 90 tablet 2   tamoxifen (NOLVADEX) 20 MG tablet TAKE 1 TABLET BY MOUTH DAILY 120 tablet 2   triamcinolone ointment (KENALOG) 0.5 % Apply topically 2 (two) times daily.     No current facility-administered medications for this visit.     PHYSICAL EXAMINATION: Performance status (ECOG): 0 - Asymptomatic  Vitals:   02/25/22 1354  BP: (!) 141/74  Pulse: 84  Resp: 18  Temp: 98.4 F (36.9 C)  SpO2: 96%   Wt Readings from Last 3 Encounters:  02/25/22 154 lb 3.2 oz (69.9 kg)  01/23/22 152 lb 3.2 oz (69 kg)  11/20/21 151 lb 9.6 oz (68.8 kg)   Physical Exam Vitals reviewed.  Constitutional:      Appearance: Normal appearance.  Cardiovascular:     Rate and Rhythm: Normal rate and regular rhythm.     Pulses: Normal pulses.     Heart sounds: Normal heart sounds.  Pulmonary:     Effort: Pulmonary effort is normal.     Breath sounds: Normal breath sounds.  Chest:  Breasts:    Right: Normal. No swelling, bleeding, inverted nipple, mass, nipple  discharge, skin change or tenderness.     Left: Swelling (lymphedema present in lower part of breast) present. No bleeding, inverted nipple, mass, nipple discharge, skin change (lumpectomy scar around areola WNL) or tenderness.  Lymphadenopathy:     Upper Body:     Right upper body: No supraclavicular, axillary or pectoral adenopathy.     Left upper body: No supraclavicular, axillary or pectoral adenopathy.  Neurological:     General: No focal deficit present.     Mental Status: She is alert and oriented to person, place, and time.  Psychiatric:        Mood and Affect: Mood normal.        Behavior: Behavior normal.     Breast Exam Chaperone: Thana Ates     LABORATORY DATA:  I have reviewed the data as listed    Latest Ref Rng & Units 02/17/2022   11:31 AM 12/17/2021    9:22 AM 08/05/2021   11:11 AM  CMP  Glucose 70 - 99 mg/dL 107  110  107   BUN 8 - 23 mg/dL <_0 Creatinine 0.44 - 1.00 mg/dL 0.60  0.62  0.52   Sodium 135 - 145 mmol/L 137  137  135   Potassium 3.5 - 5.1 mmol/L 3.7  3.9  4.0   Chloride 98 - 111 mmol/L 107  100  102   CO2 22 - 32 mmol/L _1 Calcium 8.9 - 10.3 mg/dL 8.8  8.7  8.9   Total Protein 6.5 - 8.1 g/dL 7.5  6.4  7.0   Total Bilirubin 0.3 - 1.2 mg/dL 0.4  <0.2  0.4   Alkaline Phos 38 - 126 U/L 36  41  34   AST 15 - 41 U/L _2 ALT 0 - 44 U/L _3 No results found for: "CAN153" Lab Results  Component Value Date   WBC 7.0 02/17/2022   HGB 12.0 02/17/2022   HCT 37.1 02/17/2022   MCV 89.8 02/17/2022   PLT 242 02/17/2022   NEUTROABS 4.1 02/17/2022    ASSESSMENT:  1.  Stage II (PT2PN1) infiltrating lobular carcinoma of the left breast: -Left lumpectomy and sentinel lymph node biopsy on 10/31/2019 with 3.7 cm grade 2 infiltrating lobular carcinoma, margins negative, 1/3 lymph nodes positive, Ki-67 2%, ER/PR 100%. -Oncotype DX score was 9 with 9-year distant recurrence with tamoxifen/AI was 12%.  No benefit from adjuvant  chemotherapy. -Anastrozole started on 11/28/2019. -She finished radiation therapy to the left breast. - Anastrozole changed to tamoxifen secondary to osteoporosis.   2.  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   3.  Osteoporosis: -DEXA scan on March 03, 2019 shows T score -  3.4. -She was started on Prolia which was given on 03/14/2020. -She is not interested in any additional Prolia secondary to cost.   PLAN:  1.  Stage II (PT2PN1) infiltrating lobular carcinoma of the left breast: - She is tolerating tamoxifen very well.  Physical examination did not reveal any palpable masses or adenopathy. - Mammogram on 11/12/2021 was BI-RADS Category 2. - Reviewed labs today which showed normal CBC and CMP. - RTC 6 months for follow-up with repeat labs.   2.  Osteoporosis: - Continue Fosamax 70 mg weekly.   3.  Vitamin D deficiency: - Reportedly had a reaction with rash and eye swelling with vitamin D.  I have recommended her to restart a different preparation.  Vitamin D level was 29.  Breast Cancer therapy associated bone loss: I have recommended calcium, Vitamin D and weight bearing exercises.  Orders placed this encounter:  Orders Placed This Encounter  Procedures   MM Digital Screening   CBC with Differential   Comprehensive metabolic panel   Vitamin D 25 hydroxy    The patient has a good understanding of the overall plan. She agrees with it. She will call with any problems that may develop before the next visit here.  Derek Jack, MD [place] [phone]

## 2022-03-13 ENCOUNTER — Encounter (INDEPENDENT_AMBULATORY_CARE_PROVIDER_SITE_OTHER): Payer: Self-pay | Admitting: Gastroenterology

## 2022-03-13 ENCOUNTER — Ambulatory Visit (INDEPENDENT_AMBULATORY_CARE_PROVIDER_SITE_OTHER): Payer: Commercial Managed Care - PPO | Admitting: Gastroenterology

## 2022-03-13 ENCOUNTER — Encounter (INDEPENDENT_AMBULATORY_CARE_PROVIDER_SITE_OTHER): Payer: Self-pay

## 2022-03-13 ENCOUNTER — Telehealth (INDEPENDENT_AMBULATORY_CARE_PROVIDER_SITE_OTHER): Payer: Self-pay

## 2022-03-13 VITALS — BP 127/63 | HR 76 | Temp 98.1°F | Ht 67.0 in | Wt 155.1 lb

## 2022-03-13 DIAGNOSIS — K9041 Non-celiac gluten sensitivity: Secondary | ICD-10-CM | POA: Insufficient documentation

## 2022-03-13 DIAGNOSIS — R112 Nausea with vomiting, unspecified: Secondary | ICD-10-CM | POA: Diagnosis not present

## 2022-03-13 DIAGNOSIS — R195 Other fecal abnormalities: Secondary | ICD-10-CM | POA: Diagnosis not present

## 2022-03-13 DIAGNOSIS — R143 Flatulence: Secondary | ICD-10-CM

## 2022-03-13 MED ORDER — PEG 3350-KCL-NA BICARB-NACL 420 G PO SOLR: 4000.0000 mL | ORAL | 0 refills | Status: DC

## 2022-03-13 NOTE — Telephone Encounter (Signed)
Monica Russell Ann Kelten Enochs, CMA  ?

## 2022-03-13 NOTE — Progress Notes (Signed)
Referring Provider: Kathyrn Drown, MD Primary Care Physician:  Kathyrn Drown, MD Primary GI Physician: Jenetta Downer   Chief Complaint  Patient presents with   Follow-up    Patient here today follow on gas and foul smelling stools. She says her hemorrhoids have not been an issue.   HPI:   Monica Russell is a 61 y.o. female with past medical history of  breast cancer, high cholesterol, IBS, renal cell cancer.  Patient presenting today for follow up of gas and foul smelling stools.   Last seen 11/11/21, at that time having a lot of gas, felt hemorrhoids had improved with previous Rx of anusol. She reported that breads, pastas and rice, she has had loose stools and more gas recently that has been very foul smelling, reports that stools are also very foul smelling, with sulfur like odor. Symptoms began in February. also having vomiting with certain foods as well to include breads, pastas, grains and eggs. hx of IBS and she previously improved a lot after using dicyclomine and eliminating certain foods in the past. Using famotidine after dinner and protonix PRN. hgb has been slightly low around 11.8, though FOBT in feb 2023 was negative. Hgb in similar range about 1 year ago.    Iron studies, celiac panel done, EGD scheduled, as below, recommend possible GES/SIBO breath testing if no improvement with low FODMAP diet.  Iron 62, TIBC 306, sat 20, ferritin 68, celiac panel neg  Present: She reports that she started low FODMAP diet and realized that bread tends to cause worsening symptoms, if she eats foods with yeast she will have nausea/vomiting. She cut out caffeine and most artifical sweeteners. She denies abdominal pain but continues to have foul smelling gas. She is taking gas-x frequently, up to 4x/day to help with this. She avoid fried, greasy foods. She does note that she had some diarrhea yesterday after a belvita bar. Stools are mushy most of the time, BM frequency ranges from 3-4 x/day.  She has a lot of rolling in her stomach and can take bentyl PRN with good results. She denies over abdominal pain. Denies blood in stools or black stools. Weight is stable    She has occasional heartburn. She is avoiding trigger foods such as citrus foods and bananas. She is taking pantoprazole 7m once day. She dose pepcid PRN in the evenings sometimes when she feels bloated.   Last Colonoscopy:04/28/12 scattered diverticula throughout colon, colonic anastomosis at 18cm from anal margin, small external hemorrhoids Last Endoscopy: 11/20/21 - Salmon-colored mucosa suspicious for Barrett's esophagus. Biopsied(short segment barretts with intestinal metaplasia) - 1 cm hiatal hernia. - Normal stomach. - Normal examined duodenum. Biopsied-no specific pathologic diagnosis   Recommendations:  Repeat EGD in 5 years  Past Medical History:  Diagnosis Date   Breast cancer ([facility]    High cholesterol    IBS (irritable bowel syndrome)    Renal cell cancer ([facility]     Past Surgical History:  Procedure Laterality Date   BIOPSY  11/20/2021   Procedure: BIOPSY;  Surgeon: CMontez Morita DQuillian Quince MD;  [facility];  Service: Gastroenterology;;  small bowel and GE junction   BREAST LUMPECTOMY WITH RADIOACTIVE SEED AND SENTINEL LYMPH NODE BIOPSY Left 10/31/2019   Procedure: LEFT BREAST LUMPECTOMY X 2 WITH RADIOACTIVE SEED AND SENTINEL LYMPH NODE BIOPSY, LEFT AXILLARY SENTINEL NODE RADIOACTIVE SEED GUIDED EXCISION;  Surgeon: WRolm Bookbinder MD;  [facility]  Service: General;  Laterality: Left;   CATARACT  EXTRACTION Bilateral    06/2021   CHOLECYSTECTOMY     COLONOSCOPY  04/28/2012   [facility]: scattered diverticula throughout colon, colonic anastomosis at 18cm from anal margin, small external hemorrhoids   ESOPHAGOGASTRODUODENOSCOPY (EGD) WITH PROPOFOL N/A 11/20/2021   Procedure: ESOPHAGOGASTRODUODENOSCOPY (EGD) WITH PROPOFOL;  Surgeon: Harvel Quale, MD;   [facility];  Service: Gastroenterology;  Laterality: N/A;  1215   PARTIAL COLECTOMY     for diverticulitis   PARTIAL NEPHRECTOMY     11 yrs ago for a renal carcinoma, right    Current Outpatient Medications  Medication Sig Dispense Refill   albuterol (PROVENTIL) (2.5 MG/3ML) 0.083% nebulizer solution Take 3 mLs (2.5 mg total) by nebulization every 6 (six) hours as needed for wheezing or shortness of breath. 75 mL 12   alendronate (FOSAMAX) 70 MG tablet TAKE 1 TABLET BY MOUTH ONCE A WEEK, TAKE WITH FULL GLASS OF WATER ON AN EMPTY STOMACH 44 tablet 0   dicyclomine (BENTYL) 10 MG capsule Take 1 capsule (10 mg total) by mouth every 12 (twelve) hours as needed for spasms (abdominal pain). 60 capsule 0   famotidine (PEPCID) 20 MG tablet Take 20 mg by mouth daily before supper.     hydrocortisone (ANUSOL-HC) 25 MG suppository Place 1 suppository (25 mg total) rectally 2 (two) times daily as needed for hemorrhoids or anal itching. 24 suppository 2   pantoprazole (PROTONIX) 40 MG tablet Take 1 tablet (40 mg total) by mouth daily. 90 tablet 3   Respiratory Therapy Supplies (NEBULIZER) DEVI Use as directed 1 each 0   Respiratory Therapy Supplies (NEBULIZER/TUBING/MOUTHPIECE) KIT Use as directed 1 kit 0   rosuvastatin (CRESTOR) 20 MG tablet Take 1 tablet (20 mg total) by mouth daily. 90 tablet 2   tamoxifen (NOLVADEX) 20 MG tablet TAKE 1 TABLET BY MOUTH DAILY 120 tablet 2   No current facility-administered medications for this visit.    Allergies as of 03/13/2022 - Review Complete 03/13/2022  Allergen Reaction Noted   Penicillins Hives 04/07/2012   Vitamin d analogs Rash 08/17/2019    Family History  Problem Relation Age of Onset   Heart disease Maternal Grandmother    Diabetes Maternal Grandmother    Heart disease Maternal Grandfather    Dementia Maternal Grandfather    Hypertension Mother    Mental illness Mother    Mental illness Brother     Social History    Socioeconomic History   Marital status: Married    Spouse name: Not on file   Number of children: 2   Years of education: Not on file   Highest education level: Not on file  Occupational History   Occupation: Retired  Tobacco Use   Smoking status: Every Day    Packs/day: 0.50    Years: 40.00    Total pack years: 20.00    Types: Cigarettes    Passive exposure: Current   Smokeless tobacco: Never   Tobacco comments:    10-12 a day  Vaping Use   Vaping Use: Never used  Substance and Sexual Activity   Alcohol use: No    Alcohol/week: 0.0 standard drinks of alcohol    Comment: rarely   Drug use: No   Sexual activity: Not Currently    Birth control/protection: Post-menopausal  Other Topics Concern   Not on file  Social History Narrative   Pt's husband has dementia   Social Determinants of Health   Financial Resource Strain: Low Risk  (09/06/2021)   Overall Financial  Resource Strain (CARDIA)    Difficulty of Paying Living Expenses: Not hard at all  Food Insecurity: No Food Insecurity (09/06/2021)   Hunger Vital Sign    Worried About Running Out of Food in the Last Year: Never true    Ran Out of Food in the Last Year: Never true  Transportation Needs: No Transportation Needs (09/06/2021)   PRAPARE - Hydrologist (Medical): No    Lack of Transportation (Non-Medical): No  Physical Activity: Insufficiently Active (09/06/2021)   Exercise Vital Sign    Days of Exercise per Week: 2 days    Minutes of Exercise per Session: 30 min  Stress: Stress Concern Present (09/06/2021)   [place]    Feeling of Stress : To some extent  Social Connections: Moderately Integrated (09/06/2021)   Social Connection and Isolation Panel [NHANES]    Frequency of Communication with Friends and Family: Three times a week    Frequency of Social Gatherings with Friends and Family: Once a week    Attends  Religious Services: More than 4 times per year    Active Member of [employer] or Organizations: No    Attends Music therapist: Never    Marital Status: Married   Review of systems General: negative for malaise, night sweats, fever, chills, weight loss Neck: Negative for lumps, goiter, pain and significant neck swelling Resp: Negative for cough, wheezing, dyspnea at rest CV: Negative for chest pain, leg swelling, palpitations, orthopnea GI: denies melena, hematochezia,constipation, dysphagia, odyonophagia, early satiety or unintentional weight loss. +flatulence +loose stools +foul smelling stools +bloating +occasional n/v MSK: Negative for joint pain or swelling, back pain, and muscle pain. Derm: Negative for itching or rash Psych: Denies depression, anxiety, memory loss, confusion. No homicidal or suicidal ideation.  Heme: Negative for prolonged bleeding, bruising easily, and swollen nodes. Endocrine: Negative for cold or heat intolerance, polyuria, polydipsia and goiter. Neuro: negative for tremor, gait imbalance, syncope and seizures. The remainder of the review of systems is noncontributory.  Physical Exam: BP 127/63 (BP Location: Left Arm, Patient Position: Sitting, Cuff Size: Small)   Pulse 76   Temp 98.1 F (36.7 C) (Oral)   Ht 5' 7" (1.702 m)   Wt 155 lb 1.6 oz (70.4 kg)   BMI 24.29 kg/m  General:   Alert and oriented. No distress noted. Pleasant and cooperative.  Head:  Normocephalic and atraumatic. Eyes:  Conjuctiva clear without scleral icterus. Mouth:  Oral mucosa pink and moist. Good dentition. No lesions. Heart: Normal rate and rhythm, s1 and s2 heart sounds present.  Lungs: Clear lung sounds in all lobes. Respirations equal and unlabored. Abdomen:  +BS, soft, non-tender and non-distended. No rebound or guarding. No HSM or masses noted. Derm: No palmar erythema or jaundice Msk:  Symmetrical without gross deformities. Normal posture. Extremities:  Without  edema. Neurologic:  Alert and  oriented x4 Psych:  Alert and cooperative. Normal mood and affect.  Invalid input(s): "6 MONTHS"   ASSESSMENT: Monica Russell is a 61 y.o. female presenting today for follow up of flatulence and foul smelling stools.  Celiac panel previously negative, EGD done in may 2023 with short segment Barrett's esophagus, recommended repeat EGD in 5 years. No h pylori. Patient reporting that she has a lot of flatulence with some bloating still, she is using gas x regularly with some relief. She notes that breads/grains seem to worsen her symptoms. She will sometimes have nausea/vomiting  after consuming bread/yeast products. She is trying to follow low FODMAP and has cut out a lot of other trigger foods. She has some improvement of symptoms with bentyl. While celiac panel was negative, query if she still has underlying gluten intolerance. I encouraged her to go gluten free for 3-4 weeks to see if she has improvement of symptoms. If she continues with flatulence, bloating and foul smelling stools, would recommend SIBO breath test, as patient does not want to proceed with testing at this time and prefers to try continued dietary changes. I also recommended she try daily probiotic.   In regards to GERD, she is on pantoprazole 86m once daily and takes famotidine PRN. Symptoms relatively well controlled as long as she avoids trigger foods. Will continue with current regimen as long as she has good control over Reflux given findings of Barrett's esophagus.   She is due for screening colonoscopy in October 2023, will get her scheduled for this. Indications, risks and benefits of procedure discussed in detail with patient. Patient verbalized understanding and is in agreement to proceed with Colonoscopy at this time.    PLAN:  Schedule screening colonoscopy  2. Continue low FODMAP/gluten free diet  3. Consider SIBO breath test if no improvement in symptoms  4. Can try beano for gas   5. Continue pantoprazole 421mdaily 6. Pepcid PRN 7. Continue bentyl PRN 8. Daily probiotic, multi strain  All questions were answered, patient verbalized understanding and is in agreement with plan as outlined above.   Follow Up: 6 months   [place] Dittus L. CaAlver SorrowMSN, APRN, AGNP-C Adult-Gerontology Nurse Practitioner [facility]

## 2022-03-13 NOTE — Patient Instructions (Addendum)
Lets continue with low FODMAP and try to do 2-3 weeks of complete gluten elimination, if you are still having symptoms, and would like to do the breath testing at [facility], please let me know You can try beano for your gas We will get you scheduled for updated colonoscopy You can also try a probiotic daily supplement, aim for one with atleast 2-3 strains or can do activia daily  Follow up 6 months

## 2022-05-07 ENCOUNTER — Other Ambulatory Visit (HOSPITAL_COMMUNITY): Payer: Self-pay | Admitting: Hematology

## 2022-05-08 ENCOUNTER — Other Ambulatory Visit (INDEPENDENT_AMBULATORY_CARE_PROVIDER_SITE_OTHER): Payer: Self-pay

## 2022-05-08 DIAGNOSIS — Z1211 Encounter for screening for malignant neoplasm of colon: Secondary | ICD-10-CM

## 2022-05-12 ENCOUNTER — Other Ambulatory Visit: Payer: Self-pay | Admitting: Adult Health

## 2022-05-12 MED ORDER — FLUCONAZOLE 150 MG PO TABS: ORAL_TABLET | ORAL | 1 refills | Status: DC

## 2022-05-12 NOTE — Progress Notes (Signed)
Rx diflucan.  

## 2022-05-21 ENCOUNTER — Encounter (HOSPITAL_COMMUNITY)
Admission: RE | Disposition: A | Discharge status: Home / Self Care | Payer: Self-pay | Source: Home / Self Care | Attending: Gastroenterology

## 2022-05-21 ENCOUNTER — Ambulatory Visit (HOSPITAL_BASED_OUTPATIENT_CLINIC_OR_DEPARTMENT_OTHER): Payer: Commercial Managed Care - PPO | Admitting: Anesthesiology

## 2022-05-21 ENCOUNTER — Other Ambulatory Visit: Payer: Self-pay

## 2022-05-21 ENCOUNTER — Ambulatory Visit (HOSPITAL_COMMUNITY)
Admission: RE | Admit: 2022-05-21 | Discharge: 2022-05-21 | Disposition: A | Discharge status: Home / Self Care | Payer: Commercial Managed Care - PPO | Attending: Gastroenterology | Admitting: Gastroenterology

## 2022-05-21 ENCOUNTER — Ambulatory Visit (HOSPITAL_COMMUNITY): Payer: Commercial Managed Care - PPO | Admitting: Anesthesiology

## 2022-05-21 ENCOUNTER — Encounter (HOSPITAL_COMMUNITY): Payer: Self-pay | Admitting: Gastroenterology

## 2022-05-21 DIAGNOSIS — K621 Rectal polyp: Secondary | ICD-10-CM | POA: Diagnosis not present

## 2022-05-21 DIAGNOSIS — K635 Polyp of colon: Secondary | ICD-10-CM | POA: Diagnosis not present

## 2022-05-21 DIAGNOSIS — K5730 Diverticulosis of large intestine without perforation or abscess without bleeding: Secondary | ICD-10-CM | POA: Diagnosis not present

## 2022-05-21 DIAGNOSIS — K648 Other hemorrhoids: Secondary | ICD-10-CM

## 2022-05-21 DIAGNOSIS — F17210 Nicotine dependence, cigarettes, uncomplicated: Secondary | ICD-10-CM | POA: Insufficient documentation

## 2022-05-21 DIAGNOSIS — Z853 Personal history of malignant neoplasm of breast: Secondary | ICD-10-CM | POA: Insufficient documentation

## 2022-05-21 DIAGNOSIS — D128 Benign neoplasm of rectum: Secondary | ICD-10-CM | POA: Diagnosis not present

## 2022-05-21 DIAGNOSIS — Z1211 Encounter for screening for malignant neoplasm of colon: Secondary | ICD-10-CM | POA: Insufficient documentation

## 2022-05-21 DIAGNOSIS — Z905 Acquired absence of kidney: Secondary | ICD-10-CM | POA: Insufficient documentation

## 2022-05-21 DIAGNOSIS — Z85528 Personal history of other malignant neoplasm of kidney: Secondary | ICD-10-CM | POA: Insufficient documentation

## 2022-05-21 DIAGNOSIS — K589 Irritable bowel syndrome without diarrhea: Secondary | ICD-10-CM | POA: Diagnosis not present

## 2022-05-21 DIAGNOSIS — E7800 Pure hypercholesterolemia, unspecified: Secondary | ICD-10-CM | POA: Diagnosis not present

## 2022-05-21 DIAGNOSIS — D123 Benign neoplasm of transverse colon: Secondary | ICD-10-CM

## 2022-05-21 HISTORY — PX: POLYPECTOMY: SHX5525

## 2022-05-21 HISTORY — PX: COLONOSCOPY WITH PROPOFOL: SHX5780

## 2022-05-21 SURGERY — COLONOSCOPY WITH PROPOFOL
Anesthesia: General

## 2022-05-21 MED ORDER — LACTATED RINGERS IV SOLN: INTRAVENOUS | Status: DC

## 2022-05-21 MED ORDER — PHENYLEPHRINE 80 MCG/ML (10ML) SYRINGE FOR IV PUSH (FOR BLOOD PRESSURE SUPPORT)
PREFILLED_SYRINGE | INTRAVENOUS | Status: DC | PRN
  Administered 2022-05-21 (×2): 80 ug via INTRAVENOUS

## 2022-05-21 MED ORDER — LIDOCAINE HCL (CARDIAC) PF 100 MG/5ML IV SOSY
PREFILLED_SYRINGE | INTRAVENOUS | Status: DC | PRN
  Administered 2022-05-21: 50 mg via INTRAVENOUS

## 2022-05-21 MED ORDER — PROPOFOL 500 MG/50ML IV EMUL
INTRAVENOUS | Status: DC | PRN
  Administered 2022-05-21: 125 ug/kg/min via INTRAVENOUS

## 2022-05-21 MED ORDER — PROPOFOL 10 MG/ML IV BOLUS
INTRAVENOUS | Status: DC | PRN
  Administered 2022-05-21 (×2): 20 mg via INTRAVENOUS
  Administered 2022-05-21: 40 mg via INTRAVENOUS
  Administered 2022-05-21: 20 mg via INTRAVENOUS

## 2022-05-21 NOTE — Discharge Instructions (Signed)
You are being discharged to home.  Resume your previous diet.  We are waiting for your pathology results.  Your physician has recommended a repeat colonoscopy for surveillance based on pathology results.  

## 2022-05-21 NOTE — H&P (Signed)
Monica Russell is an 61 y.o. female.   Chief Complaint: Colorectal cancer screening HPI: 61 year old female with past medical history of breast cancer, hyperlipidemia, IBS, renal cell cancer, coming for screening colonoscopy.  Last colonoscopy was performed 10 years ago, no polyps were found.  The patient denies having any complaints such as melena, hematochezia, abdominal pain or distention, change in her bowel movement consistency or frequency, no changes in weight recently.  No family history of colorectal cancer.   Past Medical History:  Diagnosis Date   Breast cancer ([place])    High cholesterol    IBS (irritable bowel syndrome)    Renal cell cancer [facility])     Past Surgical History:  Procedure Laterality Date   BIOPSY  11/20/2021   Procedure: BIOPSY;  Surgeon: Montez Morita, Quillian Quince, MD;  Location: [facility];  Service: Gastroenterology;;  small bowel and GE junction   BREAST LUMPECTOMY WITH RADIOACTIVE SEED AND SENTINEL LYMPH NODE BIOPSY Left 10/31/2019   Procedure: LEFT BREAST LUMPECTOMY X 2 WITH RADIOACTIVE SEED AND SENTINEL LYMPH NODE BIOPSY, LEFT AXILLARY SENTINEL NODE RADIOACTIVE SEED GUIDED EXCISION;  Surgeon: Rolm Bookbinder, MD;  Location: [facility];  Service: General;  Laterality: Left;   CATARACT EXTRACTION Bilateral    06/2021   CHOLECYSTECTOMY     COLONOSCOPY  04/28/2012   Rehman: scattered diverticula throughout colon, colonic anastomosis at 18cm from anal margin, small external hemorrhoids   ESOPHAGOGASTRODUODENOSCOPY (EGD) WITH PROPOFOL N/A 11/20/2021   Procedure: ESOPHAGOGASTRODUODENOSCOPY (EGD) WITH PROPOFOL;  Surgeon: Harvel Quale, MD;  Location: [facility];  Service: Gastroenterology;  Laterality: N/A;  1215   PARTIAL COLECTOMY     for diverticulitis   PARTIAL NEPHRECTOMY     11 yrs ago for a renal carcinoma, right    Family History  Problem Relation Age of Onset   Heart disease Maternal Grandmother    Diabetes Maternal  Grandmother    Heart disease Maternal Grandfather    Dementia Maternal Grandfather    Hypertension Mother    Mental illness Mother    Mental illness Brother    Social History:  reports that she has been smoking cigarettes. She has a 20.00 pack-year smoking history. She has been exposed to tobacco smoke. She has never used smokeless tobacco. She reports that she does not drink alcohol and does not use drugs.  Allergies:  Allergies  Allergen Reactions   Penicillins Hives   Vitamin D Analogs Rash    Medications Prior to Admission  Medication Sig Dispense Refill   acetaminophen (TYLENOL) 500 MG tablet Take 500-1,000 mg by mouth every 6 (six) hours as needed (pain.).     alendronate (FOSAMAX) 70 MG tablet TAKE 1 TABLET BY MOUTH ONCE A WEEK, TAKE WITH FULL GLASS OF WATER ON AN EMPTY STOMACH (Patient taking differently: Take 70 mg by mouth every Sunday.) 44 tablet 0   famotidine (PEPCID) 20 MG tablet Take 20 mg by mouth at bedtime as needed for heartburn or indigestion.     fluconazole (DIFLUCAN) 150 MG tablet Take 1 now and 1 in 3 days if needed (Patient not taking: Reported on 05/15/2022) 2 tablet 1   pantoprazole (PROTONIX) 40 MG tablet Take 1 tablet (40 mg total) by mouth daily. (Patient taking differently: Take 40 mg by mouth daily as needed (reflux/indigestion.).) 90 tablet 3   polyethylene glycol-electrolytes (TRILYTE) 420 g solution Take 4,000 mLs by mouth as directed. 4000 mL 0   Probiotic Product (ALIGN) 4 MG CAPS Take 4 mg  by mouth in the morning.     rosuvastatin (CRESTOR) 20 MG tablet Take 1 tablet (20 mg total) by mouth daily. (Patient taking differently: Take 20 mg by mouth every evening.) 90 tablet 2   tamoxifen (NOLVADEX) 20 MG tablet TAKE 1 TABLET BY MOUTH DAILY (Patient taking differently: Take 20 mg by mouth every evening.) 120 tablet 2   albuterol (PROVENTIL) (2.5 MG/3ML) 0.083% nebulizer solution Take 3 mLs (2.5 mg total) by nebulization every 6 (six) hours as needed for  wheezing or shortness of breath. 75 mL 12   cholecalciferol (QC VITAMIN D3) 10 MCG (400 UNIT) TABS tablet Take 400 Units by mouth every evening.     dicyclomine (BENTYL) 10 MG capsule Take 1 capsule (10 mg total) by mouth every 12 (twelve) hours as needed for spasms (abdominal pain). 60 capsule 0   hydrocortisone (ANUSOL-HC) 25 MG suppository Place 1 suppository (25 mg total) rectally 2 (two) times daily as needed for hemorrhoids or anal itching. (Patient not taking: Reported on 05/15/2022) 24 suppository 2   Respiratory Therapy Supplies (NEBULIZER) DEVI Use as directed 1 each 0   Respiratory Therapy Supplies (NEBULIZER/TUBING/MOUTHPIECE) KIT Use as directed 1 kit 0    No results found for this or any previous visit (from the past 43 hour(s)). No results found.  Review of Systems  All other systems reviewed and are negative.   Blood pressure 121/69, pulse 89, temperature 98.2 F (36.8 C), temperature source Oral, resp. rate 12, height 5' 7" (1.702 m), weight 70.3 kg, SpO2 100 %. Physical Exam  GENERAL: The patient is AO x3, in no acute distress. HEENT: Head is normocephalic and atraumatic. EOMI are intact. Mouth is well hydrated and without lesions. NECK: Supple. No masses LUNGS: Clear to auscultation. No presence of rhonchi/wheezing/rales. Adequate chest expansion HEART: RRR, normal s1 and s2. ABDOMEN: Soft, nontender, no guarding, no peritoneal signs, and nondistended. BS +. No masses. EXTREMITIES: Without any cyanosis, clubbing, rash, lesions or edema. NEUROLOGIC: AOx3, no focal motor deficit. SKIN: no jaundice, no rashes  Assessment/Plan 61 year old female with past medical history of breast cancer, hyperlipidemia, IBS, renal cell cancer, coming for screening colonoscopy.  The patient is at average risk for colorectal cancer.  We will proceed with colonoscopy today.   Harvel Quale, MD 05/21/2022, 9:05 AM

## 2022-05-21 NOTE — Anesthesia Preprocedure Evaluation (Addendum)
Anesthesia Evaluation  Patient identified by MRN, date of birth, ID band Patient awake    Reviewed: Allergy & Precautions, H&P , NPO status , Patient's Chart, lab work & pertinent test results, reviewed documented beta blocker date and time   Airway Mallampati: I  TM Distance: >3 FB Neck ROM: full    Dental  (+) Dental Advisory Given, Implants Bridge :   Pulmonary asthma , Current Smoker and Patient abstained from smoking.   Pulmonary exam normal breath sounds clear to auscultation       Cardiovascular Exercise Tolerance: Good negative cardio ROS  Rhythm:regular Rate:Normal     Neuro/Psych negative neurological ROS  negative psych ROS   GI/Hepatic Neg liver ROS,GERD  Medicated, Controlled and Poorly Controlled,,  Endo/Other  negative endocrine ROS    Renal/GU Renal disease (partial nephrectomy)  negative genitourinary   Musculoskeletal negative musculoskeletal ROS (+)    Abdominal   Peds negative pediatric ROS (+)  Hematology  (+) Blood dyscrasia, anemia   Anesthesia Other Findings Left breast cancer with lymph node removal  Reproductive/Obstetrics negative OB ROS                             Anesthesia Physical Anesthesia Plan  ASA: 2  Anesthesia Plan: General   Post-op Pain Management:    Induction:   PONV Risk Score and Plan: 1 and Propofol infusion  Airway Management Planned: Nasal Cannula and Natural Airway  Additional Equipment:   Intra-op Plan:   Post-operative Plan:   Informed Consent: I have reviewed the patients History and Physical, chart, labs and discussed the procedure including the risks, benefits and alternatives for the proposed anesthesia with the patient or authorized representative who has indicated his/her understanding and acceptance.     Dental Advisory Given  Plan Discussed with: CRNA and Surgeon  Anesthesia Plan Comments:          Anesthesia Quick Evaluation

## 2022-05-21 NOTE — Transfer of Care (Signed)
Immediate Anesthesia Transfer of Care Note  Patient: Monica Russell  Procedure(s) Performed: COLONOSCOPY WITH PROPOFOL POLYPECTOMY  Patient Location: Endoscopy Unit  Anesthesia Type:MAC  Level of Consciousness: drowsy  Airway & Oxygen Therapy: Patient Spontanous Breathing and Patient connected to nasal cannula oxygen  Post-op Assessment: Report given to RN and Post -op Vital signs reviewed and stable  Post vital signs: Reviewed and stable  Last Vitals:  Vitals Value Taken Time  BP    Temp    Pulse    Resp    SpO2      Last Pain:  Vitals:   05/21/22 0946  TempSrc:   PainSc: 0-No pain      Patients Stated Pain Goal: 9 (35/94/09 0502)  Complications: No notable events documented.

## 2022-05-21 NOTE — Anesthesia Postprocedure Evaluation (Signed)
Anesthesia Post Note  Patient: Monica Russell  Procedure(s) Performed: COLONOSCOPY WITH PROPOFOL POLYPECTOMY  Patient location during evaluation: Phase II Anesthesia Type: General Level of consciousness: awake and alert and oriented Pain management: pain level controlled Vital Signs Assessment: post-procedure vital signs reviewed and stable Respiratory status: spontaneous breathing, nonlabored ventilation and respiratory function stable Cardiovascular status: blood pressure returned to baseline and stable Postop Assessment: no apparent nausea or vomiting Anesthetic complications: no  No notable events documented.   Last Vitals:  Vitals:   05/21/22 1008 05/21/22 1012  BP: (!) 82/42 (!) 94/59  Pulse: 75 75  Resp: 13 16  Temp: 36.5 C   SpO2: 98% 98%    Last Pain:  Vitals:   05/21/22 1008  TempSrc: Oral  PainSc: 0-No pain                 Shalawn Wynder C Quentin Strebel

## 2022-05-21 NOTE — Op Note (Signed)
[facility] Patient Name: Monica Russell Procedure Date: 05/21/2022 9:33 AM MRN: [id number] Date of Birth: July 22, 1960 Attending MD: Maylon Peppers , , [id number] CSN: [id number] Age: 61 Admit Type: Outpatient Procedure:                Colonoscopy Indications:              Screening for colorectal malignant neoplasm Providers:                Maylon Peppers, Caprice Kluver, Aram Candela Referring MD:              Medicines:                Monitored Anesthesia Care Complications:            No immediate complications. Estimated Blood Loss:     Estimated blood loss: none. Procedure:                Pre-Anesthesia Assessment:                           - Prior to the procedure, a History and Physical                            was performed, and patient medications, allergies                            and sensitivities were reviewed. The patient's                            tolerance of previous anesthesia was reviewed.                           - The risks and benefits of the procedure and the                            sedation options and risks were discussed with the                            patient. All questions were answered and informed                            consent was obtained.                           - ASA Grade Assessment: II - A patient with mild                            systemic disease.                           After obtaining informed consent, the colonoscope                            was passed under direct vision. Throughout the                            procedure, the patient's blood pressure,  pulse, and                            oxygen saturations were monitored continuously. The                            [id number] ([id number]) was introduced through the                            anus and advanced to the the cecum, identified by                            appendiceal orifice and ileocecal valve. The                            colonoscopy was  performed without difficulty. The                            patient tolerated the procedure well. Scope In: 9:50:33 AM Scope Out: 10:04:38 AM Scope Withdrawal Time: 0 hours 11 minutes 21 seconds  Total Procedure Duration: 0 hours 14 minutes 5 seconds  Findings:      The perianal and digital rectal examinations were normal.      Two sessile polyps were found in the rectum and transverse colon. The       polyps were 2 to 5 mm in size. These polyps were removed with a cold       snare. Resection and retrieval were complete.      A few small-mouthed diverticula were found in the sigmoid colon.      Non-bleeding internal hemorrhoids were found during retroflexion. The       hemorrhoids were small. Impression:               - Two 2 to 5 mm polyps in the rectum and in the                            transverse colon, removed with a cold snare.                            Resected and retrieved.                           - Diverticulosis in the sigmoid colon.                           - Non-bleeding internal hemorrhoids. Moderate Sedation:      Per Anesthesia Care Recommendation:           - Discharge patient to home (ambulatory).                           - Resume previous diet.                           - Await pathology results.                           -  Repeat colonoscopy for surveillance based on                            pathology results. Procedure Code(s):        --- Professional ---                           [phone], Colonoscopy, flexible; with removal of                            tumor(s), polyp(s), or other lesion(s) by snare                            technique Diagnosis Code(s):        --- Professional ---                           Z12.11, Encounter for screening for malignant                            neoplasm of colon                           D12.8, Benign neoplasm of rectum                           D12.3, Benign neoplasm of transverse colon (hepatic                             flexure or splenic flexure)                           K64.8, Other hemorrhoids                           K57.30, Diverticulosis of large intestine without                            perforation or abscess without bleeding CPT copyright 2022 [employer]. All rights reserved. The codes documented in this report are preliminary and upon coder review may  be revised to meet current compliance requirements. Maylon Peppers, MD Maylon Peppers,  05/21/2022 10:08:43 AM This report has been signed electronically. Number of Addenda: 0

## 2022-05-22 LAB — SURGICAL PATHOLOGY

## 2022-05-25 ENCOUNTER — Encounter (INDEPENDENT_AMBULATORY_CARE_PROVIDER_SITE_OTHER): Payer: Self-pay | Admitting: Gastroenterology

## 2022-05-27 ENCOUNTER — Encounter (HOSPITAL_COMMUNITY): Payer: Self-pay | Admitting: Gastroenterology

## 2022-06-11 ENCOUNTER — Encounter: Payer: Self-pay | Admitting: Family Medicine

## 2022-08-26 ENCOUNTER — Inpatient Hospital Stay: Payer: Commercial Managed Care - PPO | Attending: Physician Assistant

## 2022-08-26 DIAGNOSIS — C50412 Malignant neoplasm of upper-outer quadrant of left female breast: Secondary | ICD-10-CM | POA: Insufficient documentation

## 2022-08-26 DIAGNOSIS — Z170 Estrogen receptor positive status [ER+]: Secondary | ICD-10-CM | POA: Diagnosis not present

## 2022-08-26 DIAGNOSIS — Z79810 Long term (current) use of selective estrogen receptor modulators (SERMs): Secondary | ICD-10-CM | POA: Diagnosis not present

## 2022-08-26 LAB — CBC WITH DIFFERENTIAL/PLATELET
Abs Immature Granulocytes: 0.02 10*3/uL (ref 0.00–0.07)
Basophils Absolute: 0.1 10*3/uL (ref 0.0–0.1)
Basophils Relative: 1 %
Eosinophils Absolute: 0.1 10*3/uL (ref 0.0–0.5)
Eosinophils Relative: 1 %
HCT: 36.2 % (ref 36.0–46.0)
Hemoglobin: 11.6 g/dL — ABNORMAL LOW (ref 12.0–15.0)
Immature Granulocytes: 0 %
Lymphocytes Relative: 32 %
Lymphs Abs: 2.1 10*3/uL (ref 0.7–4.0)
MCH: 29.1 pg (ref 26.0–34.0)
MCHC: 32 g/dL (ref 30.0–36.0)
MCV: 91 fL (ref 80.0–100.0)
Monocytes Absolute: 0.6 10*3/uL (ref 0.1–1.0)
Monocytes Relative: 9 %
Neutro Abs: 3.7 10*3/uL (ref 1.7–7.7)
Neutrophils Relative %: 57 %
Platelets: 246 10*3/uL (ref 150–400)
RBC: 3.98 MIL/uL (ref 3.87–5.11)
RDW: 13.2 % (ref 11.5–15.5)
WBC: 6.6 10*3/uL (ref 4.0–10.5)
nRBC: 0 % (ref 0.0–0.2)

## 2022-08-26 LAB — COMPREHENSIVE METABOLIC PANEL
ALT: 11 U/L (ref 0–44)
AST: 16 U/L (ref 15–41)
Albumin: 4.1 g/dL (ref 3.5–5.0)
Alkaline Phosphatase: 33 U/L — ABNORMAL LOW (ref 38–126)
Anion gap: 8 (ref 5–15)
BUN: 5 mg/dL — ABNORMAL LOW (ref 8–23)
CO2: 25 mmol/L (ref 22–32)
Calcium: 8.7 mg/dL — ABNORMAL LOW (ref 8.9–10.3)
Chloride: 103 mmol/L (ref 98–111)
Creatinine, Ser: 0.64 mg/dL (ref 0.44–1.00)
GFR, Estimated: >60 mL/min (ref >60)
Glucose, Bld: 123 mg/dL — ABNORMAL HIGH (ref 70–99)
Potassium: 3.4 mmol/L — ABNORMAL LOW (ref 3.5–5.1)
Sodium: 136 mmol/L (ref 135–145)
Total Bilirubin: 0.1 mg/dL — ABNORMAL LOW (ref 0.3–1.2)
Total Protein: 7 g/dL (ref 6.5–8.1)

## 2022-08-28 LAB — MISC LABCORP TEST (SEND OUT): Labcorp test code: 81950

## 2022-09-02 ENCOUNTER — Ambulatory Visit: Payer: Commercial Managed Care - PPO | Admitting: Physician Assistant

## 2022-09-15 ENCOUNTER — Ambulatory Visit (INDEPENDENT_AMBULATORY_CARE_PROVIDER_SITE_OTHER): Payer: Commercial Managed Care - PPO | Admitting: Gastroenterology

## 2022-09-26 ENCOUNTER — Encounter: Payer: Self-pay | Admitting: Physician Assistant

## 2022-09-26 ENCOUNTER — Inpatient Hospital Stay: Payer: Commercial Managed Care - PPO | Attending: Physician Assistant | Admitting: Physician Assistant

## 2022-09-26 VITALS — BP 120/67 | HR 89 | Temp 98.9°F | Resp 18 | Ht 67.0 in | Wt 149.1 lb

## 2022-09-26 DIAGNOSIS — M810 Age-related osteoporosis without current pathological fracture: Secondary | ICD-10-CM

## 2022-09-26 DIAGNOSIS — Z79810 Long term (current) use of selective estrogen receptor modulators (SERMs): Secondary | ICD-10-CM | POA: Insufficient documentation

## 2022-09-26 DIAGNOSIS — C50412 Malignant neoplasm of upper-outer quadrant of left female breast: Secondary | ICD-10-CM | POA: Insufficient documentation

## 2022-09-26 DIAGNOSIS — Z170 Estrogen receptor positive status [ER+]: Secondary | ICD-10-CM | POA: Diagnosis not present

## 2022-09-26 NOTE — Progress Notes (Signed)
[place], [place] [address]   CLINIC:  Medical Oncology/Hematology  PCP:  Kathyrn Drown, MD [place] / [place] [address] [phone]   REASON FOR VISIT:  Follow-up for history of left-sided breast cancer  PRIOR THERAPY: Lumpectomy and sentinel lymph node biopsy (10/31/2019) & radiation therapy  NGS Results: Not done  CURRENT THERAPY: Tamoxifen  BRIEF ONCOLOGIC HISTORY:   Oncology History  Mass of upper outer quadrant of left breast (Resolved)  08/17/2019 Initial Diagnosis   Mass of upper outer quadrant of left breast   11/11/2019 Genetic Testing        Malignant neoplasm of upper-outer quadrant of left female breast ([place])  09/15/2019 Initial Diagnosis   Malignant neoplasm of upper-outer quadrant of left female breast ([place])   09/15/2019 Cancer Staging   Staging form: Breast, AJCC 8th Edition - Clinical stage from 09/15/2019: Stage IIA (cT2, cN1(f), cM0, G2, ER+, PR+, HER2-) - Signed by Derek Jack, MD on 11/28/2019     CANCER STAGING: Cancer Staging  Malignant neoplasm of upper-outer quadrant of left female breast [facility]) Staging form: Breast, AJCC 8th Edition - Clinical stage from 09/15/2019: Stage IIA (cT2, cN1(f), cM0, G2, ER+, PR+, HER2-) - Signed by Derek Jack, MD on 11/28/2019   INTERVAL HISTORY:   Monica Russell, a 62 y.o. female, returns for routine follow-up of her left-sided breast cancer (2021). Monica Russell was last seen on 02/25/2022 by Dr. Delton Coombes.   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  She denies any recent hospitalizations, surgeries, or changes in her  baseline health status.  She denies any symptoms of recurrence such as new lumps, bone pain, chest pain, dyspnea, or abdominal pain.  She has no new headaches, seizures, or focal neurologic  deficits.  No B symptoms such as fever, chills, night sweats, unintentional weight loss.  She continues to take tamoxifen, which she is tolerating fairly well.  She reports mild intermittent hot flashes and night sweats.  No abnormal vaginal bleeding or discharge.  No symptoms of DVT or PE.  She continues to take Fosamax weekly.  However, she is not taking calcium or vitamin D.  She reports that she has tried 3 different formulations of vitamin D, and each time she had bilateral eye swelling and facial rash within 24 hours.  She denies any new onset bone pain, jaw pain, or fractures since being on bisphosphonate therapy.  She reports 60% energy and 80% appetite. Her weight fluctuates but is overall stable.     ASSESSMENT & PLAN:  1.  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 -Oncotype DX score was 9 with 9-year distant recurrence with tamoxifen/AI was 12%.  No benefit from adjuvant chemotherapy. - She completed radiation therapy to the left breast. -Anastrozole started on 11/28/2019.  Changed to tamoxifen due to osteoporosis. - She is tolerating tamoxifen very well.  Reports mild intermittent hot flashes and night sweats. - Physical examination did not reveal any palpable masses or adenopathy. - Mammogram on 11/12/2021 was BI-RADS Category 2. - Reviewed labs from 08/26/2022, which showed overall unremarkable CBC and CMP - History/ROS negative for any "red flag" symptoms of  recurrence. - PLAN: Continue tamoxifen daily. - Mammogram due May 2024 - Labs and office visit in 6 months   2.  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   3.  Osteoporosis: -DEXA scan on March 03, 2019 shows T score -3.4. -She  was started on Prolia which was given on 03/14/2020.  Later discontinued due to cost. - She is now on Fosamax 70 mg weekly. - She denies any bone pain, jaw pain, or fractures since being on bisphosphonate - PLAN: Continue Fosamax 70 mg weekly. - Recommend calcium and vitamin D supplementation. - Continue weightbearing exercises as tolerated. - Bone density scan will be due around August 2024    4.  Vitamin D deficiency: - Reports that she has tried 3 different formulations of vitamin D, and each time she had bilateral eye swelling and facial rash within 24 hours. - Most recent vitamin D (08/26/2022) significantly low at 13.1 - PLAN: I have recommended hypoallergenic vitamin D capsules.  If these produce the same reaction, would consider looking into intravenous vitamin D repletion.   PLAN SUMMARY: >> Mammogram in May 2024 >> Bone density scan August 2024 >> Labs in 6 months = CBC/D, CMP, vitamin D >> OFFICE visit in 6 months (after labs)    REVIEW OF SYSTEMS:   Review of Systems  Constitutional:  Positive for fatigue. Negative for appetite change, chills, diaphoresis, fever and unexpected weight change.  HENT:   Negative for lump/mass and nosebleeds.   Eyes:  Negative for eye problems.  Respiratory:  Positive for shortness of breath (Associated with anxiety). Negative for cough and hemoptysis.   Cardiovascular:  Negative for chest pain, leg swelling and palpitations.  Gastrointestinal:  Negative for abdominal pain, blood in stool, constipation, diarrhea, nausea and vomiting.  Genitourinary:  Negative for hematuria.   Skin: Negative.   Neurological:  Negative for dizziness, headaches and light-headedness.  Hematological:  Does not bruise/bleed easily.    PHYSICAL EXAM:   Performance status (ECOG): 0 - Asymptomatic  There were no vitals filed for this visit. Wt Readings from Last 3 Encounters:  05/21/22 155 lb (70.3 kg)  03/13/22 155 lb 1.6 oz (70.4 kg)  02/25/22 154 lb 3.2 oz  (69.9 kg)   Physical Exam Constitutional:      Appearance: Normal appearance. She is normal weight.  Cardiovascular:     Heart sounds: Normal heart sounds.  Pulmonary:     Breath sounds: Normal breath sounds.  Chest:     Comments: Dense breast tissue, particularly in left breast.  No discrete nodules or masses in either breast. Neurological:     General: No focal deficit present.     Mental Status: Mental status is at baseline.  Psychiatric:        Behavior: Behavior normal. Behavior is cooperative.      PAST MEDICAL/SURGICAL HISTORY:  Past Medical History:  Diagnosis Date   Breast cancer ([place])    High cholesterol    IBS (irritable bowel syndrome)    Renal cell cancer [facility])    Past Surgical History:  Procedure Laterality Date   BIOPSY  11/20/2021   Procedure: BIOPSY;  Surgeon: Montez Morita, Quillian Quince, MD;  [facility];  Service: Gastroenterology;;  small bowel and GE junction   BREAST LUMPECTOMY WITH RADIOACTIVE SEED AND SENTINEL LYMPH NODE BIOPSY Left 10/31/2019   Procedure: LEFT BREAST LUMPECTOMY X 2 WITH RADIOACTIVE SEED AND SENTINEL  LYMPH NODE BIOPSY, LEFT AXILLARY SENTINEL NODE RADIOACTIVE SEED GUIDED EXCISION;  Surgeon: Rolm Bookbinder, MD;  Location: [facility];  Service: General;  Laterality: Left;   CATARACT EXTRACTION Bilateral    06/2021   CHOLECYSTECTOMY     COLONOSCOPY  04/28/2012   Rehman: scattered diverticula throughout colon, colonic anastomosis at 18cm from anal margin, small external hemorrhoids   COLONOSCOPY WITH PROPOFOL N/A 05/21/2022   Procedure: COLONOSCOPY WITH PROPOFOL;  Surgeon: Harvel Quale, MD;  [facility];  Service: Gastroenterology;  Laterality: N/A;  935 ASA 2   ESOPHAGOGASTRODUODENOSCOPY (EGD) WITH PROPOFOL N/A 11/20/2021   Procedure: ESOPHAGOGASTRODUODENOSCOPY (EGD) WITH PROPOFOL;  Surgeon: Harvel Quale, MD;  [facility];  Service: Gastroenterology;  Laterality:  N/A;  1215   PARTIAL COLECTOMY     for diverticulitis   PARTIAL NEPHRECTOMY     11 yrs ago for a renal carcinoma, right   POLYPECTOMY  05/21/2022   Procedure: POLYPECTOMY;  Surgeon: Harvel Quale, MD;  [facility];  Service: Gastroenterology;;    SOCIAL HISTORY:  Social History   Socioeconomic History   Marital status: Married    Spouse name: Not on file   Number of children: 2   Years of education: Not on file   Highest education level: Not on file  Occupational History   Occupation: Retired  Tobacco Use   Smoking status: Every Day    Packs/day: 0.50    Years: 40.00    Additional pack years: 0.00    Total pack years: 20.00    Types: Cigarettes    Passive exposure: Current   Smokeless tobacco: Never   Tobacco comments:    10-12 a day  Vaping Use   Vaping Use: Never used  Substance and Sexual Activity   Alcohol use: No    Alcohol/week: 0.0 standard drinks of alcohol    Comment: rarely   Drug use: No   Sexual activity: Not Currently    Birth control/protection: Post-menopausal  Other Topics Concern   Not on file  Social History Narrative   Pt's husband has dementia   Social Determinants of Health   Financial Resource Strain: Low Risk  (09/06/2021)   Overall Financial Resource Strain (CARDIA)    Difficulty of Paying Living Expenses: Not hard at all  Food Insecurity: No Food Insecurity (09/06/2021)   Hunger Vital Sign    Worried About Running Out of Food in the Last Year: Never true    Ran Out of Food in the Last Year: Never true  Transportation Needs: No Transportation Needs (09/06/2021)   PRAPARE - Hydrologist (Medical): No    Lack of Transportation (Non-Medical): No  Physical Activity: Insufficiently Active (09/06/2021)   Exercise Vital Sign    Days of Exercise per Week: 2 days    Minutes of Exercise per Session: 30 min  Stress: Stress Concern Present (09/06/2021)   [place]    Feeling of Stress : To some extent  Social Connections: Moderately Integrated (09/06/2021)   Social Connection and Isolation Panel [NHANES]    Frequency of Communication with Friends and Family: Three times a week    Frequency of Social Gatherings with Friends and Family: Once a week    Attends Religious Services: More than 4 times per year    Active Member of [employer] or Organizations: No    Attends Archivist Meetings: Never  Marital Status: Married  Human resources officer Violence: Not At Risk (09/06/2021)   Humiliation, Afraid, Rape, and Kick questionnaire    Fear of Current or Ex-Partner: No    Emotionally Abused: No    Physically Abused: No    Sexually Abused: No    FAMILY HISTORY:  Family History  Problem Relation Age of Onset   Heart disease Maternal Grandmother    Diabetes Maternal Grandmother    Heart disease Maternal Grandfather    Dementia Maternal Grandfather    Hypertension Mother    Mental illness Mother    Mental illness Brother     CURRENT MEDICATIONS:  Current Outpatient Medications  Medication Sig Dispense Refill   fluconazole (DIFLUCAN) 150 MG tablet Take 1 now and 1 in 3 days if needed (Patient not taking: Reported on 05/15/2022) 2 tablet 1   acetaminophen (TYLENOL) 500 MG tablet Take 500-1,000 mg by mouth every 6 (six) hours as needed (pain.).     albuterol (PROVENTIL) (2.5 MG/3ML) 0.083% nebulizer solution Take 3 mLs (2.5 mg total) by nebulization every 6 (six) hours as needed for wheezing or shortness of breath. 75 mL 12   alendronate (FOSAMAX) 70 MG tablet TAKE 1 TABLET BY MOUTH ONCE A WEEK, TAKE WITH FULL GLASS OF WATER ON AN EMPTY STOMACH (Patient taking differently: Take 70 mg by mouth every Sunday.) 44 tablet 0   cholecalciferol (QC VITAMIN D3) 10 MCG (400 UNIT) TABS tablet Take 400 Units by mouth every evening.     dicyclomine (BENTYL) 10 MG capsule Take 1 capsule (10 mg total) by mouth every 12 (twelve) hours as  needed for spasms (abdominal pain). 60 capsule 0   famotidine (PEPCID) 20 MG tablet Take 20 mg by mouth at bedtime as needed for heartburn or indigestion.     hydrocortisone (ANUSOL-HC) 25 MG suppository Place 1 suppository (25 mg total) rectally 2 (two) times daily as needed for hemorrhoids or anal itching. (Patient not taking: Reported on 05/15/2022) 24 suppository 2   pantoprazole (PROTONIX) 40 MG tablet Take 1 tablet (40 mg total) by mouth daily. (Patient taking differently: Take 40 mg by mouth daily as needed (reflux/indigestion.).) 90 tablet 3   Probiotic Product (ALIGN) 4 MG CAPS Take 4 mg by mouth in the morning.     Respiratory Therapy Supplies (NEBULIZER) DEVI Use as directed 1 each 0   Respiratory Therapy Supplies (NEBULIZER/TUBING/MOUTHPIECE) KIT Use as directed 1 kit 0   rosuvastatin (CRESTOR) 20 MG tablet Take 1 tablet (20 mg total) by mouth daily. (Patient taking differently: Take 20 mg by mouth every evening.) 90 tablet 2   tamoxifen (NOLVADEX) 20 MG tablet TAKE 1 TABLET BY MOUTH DAILY (Patient taking differently: Take 20 mg by mouth every evening.) 120 tablet 2   No current facility-administered medications for this visit.    ALLERGIES:  Allergies  Allergen Reactions   Penicillins Hives   Vitamin D Analogs Rash    LABORATORY DATA:  I have reviewed the labs as listed.     Latest Ref Rng & Units 08/26/2022    1:12 PM 02/17/2022   11:31 AM 12/17/2021    9:22 AM  CBC  WBC 4.0 - 10.5 K/uL 6.6  7.0  8.3   Hemoglobin 12.0 - 15.0 g/dL 11.6  12.0  11.9   Hematocrit 36.0 - 46.0 % 36.2  37.1  36.2   Platelets 150 - 400 K/uL 246  242  262       Latest Ref Rng & Units 08/26/2022  1:12 PM 02/17/2022   11:31 AM 12/17/2021    9:22 AM  CMP  Glucose 70 - 99 mg/dL 123  107  110   BUN 8 - 23 mg/dL 5  <5  5   Creatinine 0.44 - 1.00 mg/dL 0.64  0.60  0.62   Sodium 135 - 145 mmol/L 136  137  137   Potassium 3.5 - 5.1 mmol/L 3.4  3.7  3.9   Chloride 98 - 111 mmol/L 103  107  100   CO2 22  - 32 mmol/L 25  24  23    Calcium 8.9 - 10.3 mg/dL 8.7  8.8  8.7   Total Protein 6.5 - 8.1 g/dL 7.0  7.5  6.4   Total Bilirubin 0.3 - 1.2 mg/dL 0.1  0.4  <0.2   Alkaline Phos 38 - 126 U/L 33  36  41   AST 15 - 41 U/L 16  15  12    ALT 0 - 44 U/L 11  11  7      DIAGNOSTIC IMAGING:  I have independently reviewed the scans and discussed with the patient. No results found.   WRAP UP:  All questions were answered. The patient knows to call the clinic with any problems, questions or concerns.  Medical decision making: Moderate  Time spent on visit: I spent 20 minutes counseling the patient face to face. The total time spent in the appointment was 30 minutes and more than 50% was on counseling.  Harriett Rush, PA-C  09/26/2022 10:31 PM

## 2022-09-26 NOTE — Patient Instructions (Signed)
[facility] [place] **   You were seen today by Tarri Abernethy PA-C for your follow up visit.    History of breast cancer There were no findings on your most recent labs or your physical exam that suggest recurrent breast cancer at this time. Continue to take tamoxifen as prescribed.  Please see the attached handout for further information regarding potential side effects of tamoxifen.  Vitamin D deficiency I will reach out to you in the near future with further instructions regarding your vitamin D, especially since you have some hypersensitivity symptoms and possible allergy to various vitamin D supplements.  Osteoporosis Continue taking Fosamax 70 mg once a week Start taking calcium over-the-counter supplement once a day  FOLLOW-UP APPOINTMENT: - You are due for mammogram in May 2024 - We will check labs in 6 months - Office visit in 6 months  ** Thank you for trusting me with your healthcare!  I strive to provide all of my patients with quality care at each visit.  If you receive a survey for this visit, I would be so grateful to you for taking the time to provide feedback.  Thank you in advance!  ~ Nylan Nakatani                   Dr. Derek Jack   &   Tarri Abernethy, PA-C   - - - - - - - - - - - - - - - - - -    Thank you for choosing [facility] to provide your oncology and hematology care.  To afford each patient quality time with our provider, please arrive at least 15 minutes before your scheduled appointment time.   If you have a lab appointment with the [facility] please come in thru the Main Entrance and check in at the main information desk.  You need to re-schedule your appointment should you arrive 10 or more minutes late.  We strive to give you quality time with our providers, and arriving late affects you and other patients whose appointments are  after yours.  Also, if you no show three or more times for appointments you may be dismissed from the clinic at the providers discretion.     Again, thank you for choosing [facility].  Our hope is that these requests will decrease the amount of time that you wait before being seen by our physicians.       _____________________________________________________________  Should you have questions after your visit to [facility], please contact our office at [phone] and follow the prompts.  Our office hours are 8:00 a.m. and 4:30 p.m. Monday - Friday.  Please note that voicemails left after 4:00 p.m. may not be returned until the following business day.  We are closed weekends and major holidays.  You do have access to a nurse 24-7, just call the main number to the clinic [phone] and do not press any options, hold on the line and a nurse will answer the phone.    For prescription refill requests, have your pharmacy contact our office and allow 72 hours.

## 2022-09-29 ENCOUNTER — Other Ambulatory Visit: Payer: Self-pay

## 2022-09-29 DIAGNOSIS — C50412 Malignant neoplasm of upper-outer quadrant of left female breast: Secondary | ICD-10-CM

## 2022-09-29 DIAGNOSIS — M810 Age-related osteoporosis without current pathological fracture: Secondary | ICD-10-CM

## 2022-10-10 ENCOUNTER — Ambulatory Visit (INDEPENDENT_AMBULATORY_CARE_PROVIDER_SITE_OTHER): Payer: Commercial Managed Care - PPO | Admitting: Family Medicine

## 2022-10-10 VITALS — BP 135/83 | Wt 151.4 lb

## 2022-10-10 DIAGNOSIS — B029 Zoster without complications: Secondary | ICD-10-CM

## 2022-10-10 MED ORDER — DICLOFENAC SODIUM 75 MG PO TBEC: 75.0000 mg | DELAYED_RELEASE_TABLET | Freq: Two times a day (BID) | ORAL | 0 refills | Status: DC | PRN

## 2022-10-10 NOTE — Patient Instructions (Signed)
Medication as directed.  If worsens or does not improve, please let us know.  Take care  Dr. Lacinda Axon

## 2022-10-12 DIAGNOSIS — B029 Zoster without complications: Secondary | ICD-10-CM | POA: Insufficient documentation

## 2022-10-12 NOTE — Assessment & Plan Note (Signed)
Patient experiencing pain from shingles. There is no evidence of underlying infection or other complication. Treating pain with NSAID - diclofenac.

## 2022-10-12 NOTE — Progress Notes (Signed)
Subjective:  Patient ID: Monica Russell, female    DOB: April 06, 1961  Age: 62 y.o. MRN: [id number]  CC: Chief Complaint  Patient presents with   Follow-up    Shingles-finishing med today -they are drying up good but having pain in upper right side under her ribs- feels like a throbbing and pulsing pain and does not think it is related to shingles.    HPI:  61 year old female presents for evaluation of the above.   Patient recently diagnosed with shingles and is still on antiviral treatment. Rash is improving but she is having significant pain in the area. She is concerned that she has an infection or abscess. No fever. No relieving factors. No other complaints.   Patient Active Problem List   Diagnosis Date Noted   Herpes zoster 10/12/2022   Gluten intolerance 03/13/2022   Nausea and vomiting 11/11/2021   Anemia 11/11/2021   History of left breast cancer 09/06/2021   Postmenopausal 09/06/2021   Osteoporosis 03/05/2020   Malignant neoplasm of upper-outer quadrant of left female breast ([place]) 09/15/2019   Prediabetes 10/29/2015   Vitamin D deficiency 07/04/2013   Hyperlipemia 07/01/2013   Rectal itching 08/13/2012   GERD (gastroesophageal reflux disease) 04/07/2012    Social Hx   Social History   Socioeconomic History   Marital status: Married    Spouse name: Not on file   Number of children: 2   Years of education: Not on file   Highest education level: Not on file  Occupational History   Occupation: Retired  Tobacco Use   Smoking status: Every Day    Packs/day: 0.50    Years: 40.00    Additional pack years: 0.00    Total pack years: 20.00    Types: Cigarettes    Passive exposure: Current   Smokeless tobacco: Never   Tobacco comments:    10-12 a day  Vaping Use   Vaping Use: Never used  Substance and Sexual Activity   Alcohol use: No    Alcohol/week: 0.0 standard drinks of alcohol    Comment: rarely   Drug use: No   Sexual activity: Not Currently     Birth control/protection: Post-menopausal  Other Topics Concern   Not on file  Social History Narrative   Pt's husband has dementia   Social Determinants of Health   Financial Resource Strain: Low Risk  (09/06/2021)   Overall Financial Resource Strain (CARDIA)    Difficulty of Paying Living Expenses: Not hard at all  Food Insecurity: No Food Insecurity (09/06/2021)   Hunger Vital Sign    Worried About Running Out of Food in the Last Year: Never true    Ran Out of Food in the Last Year: Never true  Transportation Needs: No Transportation Needs (09/06/2021)   PRAPARE - Hydrologist (Medical): No    Lack of Transportation (Non-Medical): No  Physical Activity: Insufficiently Active (09/06/2021)   Exercise Vital Sign    Days of Exercise per Week: 2 days    Minutes of Exercise per Session: 30 min  Stress: Stress Concern Present (09/06/2021)   [place]    Feeling of Stress : To some extent  Social Connections: Moderately Integrated (09/06/2021)   Social Connection and Isolation Panel [NHANES]    Frequency of Communication with Friends and Family: Three times a week    Frequency of Social Gatherings with Friends and Family: Once a week  Attends Religious Services: More than 4 times per year    Active Member of Clubs or Organizations: No    Attends Archivist Meetings: Never    Marital Status: Married    Review of Systems Per HPI  Objective:  BP 135/83   Wt 151 lb 6.4 oz (68.7 kg)   BMI 23.71 kg/m      10/10/2022    3:54 PM 09/26/2022   11:35 AM 05/21/2022   10:12 AM  BP/Weight  Systolic BP [id number]  Diastolic BP 83 67 59  Wt. (Lbs) 151.4 149.1   BMI 23.71 kg/m2 23.35 kg/m2     Physical Exam Vitals and nursing note reviewed.  Constitutional:      General: She is not in acute distress.    Appearance: Normal appearance.  HENT:     Head: Normocephalic and atraumatic.   Cardiovascular:     Rate and Rhythm: Normal rate and regular rhythm.  Pulmonary:     Effort: Pulmonary effort is normal.     Breath sounds: Normal breath sounds. No wheezing or rales.  Skin:    Comments: Right lower thoracic spine with resolving vesicular rash with eschar.  Neurological:     Mental Status: She is alert.  Psychiatric:        Mood and Affect: Mood normal.        Behavior: Behavior normal.     Lab Results  Component Value Date   WBC 6.6 08/26/2022   HGB 11.6 (L) 08/26/2022   HCT 36.2 08/26/2022   PLT 246 08/26/2022   GLUCOSE 123 (H) 08/26/2022   CHOL 172 12/17/2021   TRIG 81 12/17/2021   HDL 61 12/17/2021   LDLCALC 96 12/17/2021   ALT 11 08/26/2022   AST 16 08/26/2022   NA 136 08/26/2022   K 3.4 (L) 08/26/2022   CL 103 08/26/2022   CREATININE 0.64 08/26/2022   BUN 5 (L) 08/26/2022   CO2 25 08/26/2022   TSH 1.338 03/21/2013   HGBA1C 5.8 10/29/2015     Assessment & Plan:   Problem List Items Addressed This Visit       Nervous and Auditory   Herpes zoster - Primary    Patient experiencing pain from shingles. There is no evidence of underlying infection or other complication. Treating pain with NSAID - diclofenac.        Meds ordered this encounter  Medications   diclofenac (VOLTAREN) 75 MG EC tablet    Sig: Take 1 tablet (75 mg total) by mouth 2 (two) times daily as needed for moderate pain.    Dispense:  60 tablet    Refill:  0    Follow-up:  Return if symptoms worsen or fail to improve.  [place]

## 2022-10-23 ENCOUNTER — Other Ambulatory Visit: Payer: Self-pay | Admitting: Hematology

## 2022-11-17 ENCOUNTER — Encounter (HOSPITAL_COMMUNITY): Payer: Self-pay

## 2022-11-17 ENCOUNTER — Ambulatory Visit (HOSPITAL_COMMUNITY)
Admission: RE | Admit: 2022-11-17 | Discharge: 2022-11-17 | Disposition: A | Discharge status: Home / Self Care | Payer: Commercial Managed Care - PPO | Source: Ambulatory Visit | Attending: Hematology | Admitting: Hematology

## 2022-11-17 DIAGNOSIS — Z853 Personal history of malignant neoplasm of breast: Secondary | ICD-10-CM | POA: Diagnosis not present

## 2022-11-17 DIAGNOSIS — Z1231 Encounter for screening mammogram for malignant neoplasm of breast: Secondary | ICD-10-CM | POA: Insufficient documentation

## 2022-11-17 DIAGNOSIS — Z170 Estrogen receptor positive status [ER+]: Secondary | ICD-10-CM

## 2022-12-17 ENCOUNTER — Telehealth: Payer: Self-pay | Admitting: Family Medicine

## 2022-12-17 MED ORDER — ROSUVASTATIN CALCIUM 20 MG PO TABS: 20.0000 mg | ORAL_TABLET | Freq: Every day | ORAL | 0 refills | Status: DC

## 2022-12-17 NOTE — Telephone Encounter (Signed)
Refill on rosuvastatin (CRESTOR) 20 MG tablet  [facility]

## 2022-12-17 NOTE — Telephone Encounter (Signed)
Received via fax Rx request: Prescription sent electronically to pharmacy  

## 2023-02-20 ENCOUNTER — Other Ambulatory Visit (HOSPITAL_COMMUNITY): Payer: Commercial Managed Care - PPO

## 2023-02-25 ENCOUNTER — Other Ambulatory Visit: Payer: Self-pay | Admitting: Family Medicine

## 2023-02-27 ENCOUNTER — Ambulatory Visit (HOSPITAL_COMMUNITY)
Admission: RE | Admit: 2023-02-27 | Discharge: 2023-02-27 | Disposition: A | Discharge status: Home / Self Care | Payer: Commercial Managed Care - PPO | Source: Ambulatory Visit | Attending: Physician Assistant | Admitting: Physician Assistant

## 2023-02-27 DIAGNOSIS — M810 Age-related osteoporosis without current pathological fracture: Secondary | ICD-10-CM | POA: Insufficient documentation

## 2023-03-24 ENCOUNTER — Telehealth: Payer: Self-pay

## 2023-03-24 DIAGNOSIS — E782 Mixed hyperlipidemia: Secondary | ICD-10-CM

## 2023-03-24 NOTE — Telephone Encounter (Signed)
Pt needs blood work ordered for appt for the 20th   Monica Russell [phone]

## 2023-03-24 NOTE — Telephone Encounter (Signed)
So please make sure that Monica Russell is aware she has lab work ordered by hematology that is recommended to be done before her hematology visit-I believe that hematology does her blood work through the hematology floor the day before the visit  So taking into account that they are already looking at her CBC kidney liver and vitamin D the only test that she would need is a lipid profile from [place]  So therefore please order lipid profile she can do this fasting-I am not sure if hematology would draw that even if we order it so she may have to get that test through [facility]  We would recommend that she get her other lab work drawn that has already been ordered through hematology thank you

## 2023-03-27 ENCOUNTER — Other Ambulatory Visit: Payer: Commercial Managed Care - PPO

## 2023-03-27 ENCOUNTER — Other Ambulatory Visit: Payer: Self-pay

## 2023-03-27 ENCOUNTER — Inpatient Hospital Stay: Payer: Commercial Managed Care - PPO | Attending: Hematology

## 2023-03-27 DIAGNOSIS — Z8051 Family history of malignant neoplasm of kidney: Secondary | ICD-10-CM | POA: Diagnosis not present

## 2023-03-27 DIAGNOSIS — C50412 Malignant neoplasm of upper-outer quadrant of left female breast: Secondary | ICD-10-CM | POA: Diagnosis present

## 2023-03-27 DIAGNOSIS — F17210 Nicotine dependence, cigarettes, uncomplicated: Secondary | ICD-10-CM | POA: Diagnosis not present

## 2023-03-27 DIAGNOSIS — M810 Age-related osteoporosis without current pathological fracture: Secondary | ICD-10-CM | POA: Diagnosis not present

## 2023-03-27 DIAGNOSIS — Z79810 Long term (current) use of selective estrogen receptor modulators (SERMs): Secondary | ICD-10-CM | POA: Diagnosis not present

## 2023-03-27 DIAGNOSIS — Z7983 Long term (current) use of bisphosphonates: Secondary | ICD-10-CM | POA: Diagnosis not present

## 2023-03-27 DIAGNOSIS — E782 Mixed hyperlipidemia: Secondary | ICD-10-CM

## 2023-03-27 DIAGNOSIS — E559 Vitamin D deficiency, unspecified: Secondary | ICD-10-CM | POA: Diagnosis not present

## 2023-03-27 DIAGNOSIS — Z170 Estrogen receptor positive status [ER+]: Secondary | ICD-10-CM | POA: Diagnosis not present

## 2023-03-27 LAB — COMPREHENSIVE METABOLIC PANEL
ALT: 11 U/L (ref 0–44)
AST: 14 U/L — ABNORMAL LOW (ref 15–41)
Albumin: 3.8 g/dL (ref 3.5–5.0)
Alkaline Phosphatase: 37 U/L — ABNORMAL LOW (ref 38–126)
Anion gap: 10 (ref 5–15)
BUN: 5 mg/dL — ABNORMAL LOW (ref 8–23)
CO2: 24 mmol/L (ref 22–32)
Calcium: 8.6 mg/dL — ABNORMAL LOW (ref 8.9–10.3)
Chloride: 98 mmol/L (ref 98–111)
Creatinine, Ser: 0.64 mg/dL (ref 0.44–1.00)
GFR, Estimated: >60 mL/min (ref >60)
Glucose, Bld: 114 mg/dL — ABNORMAL HIGH (ref 70–99)
Potassium: 3.9 mmol/L (ref 3.5–5.1)
Sodium: 132 mmol/L — ABNORMAL LOW (ref 135–145)
Total Bilirubin: 0.5 mg/dL (ref 0.3–1.2)
Total Protein: 6.8 g/dL (ref 6.5–8.1)

## 2023-03-27 LAB — CBC WITH DIFFERENTIAL/PLATELET
Abs Immature Granulocytes: 0.02 10*3/uL (ref 0.00–0.07)
Basophils Absolute: 0.1 10*3/uL (ref 0.0–0.1)
Basophils Relative: 1 %
Eosinophils Absolute: 0.2 10*3/uL (ref 0.0–0.5)
Eosinophils Relative: 2 %
HCT: 36.9 % (ref 36.0–46.0)
Hemoglobin: 12.1 g/dL (ref 12.0–15.0)
Immature Granulocytes: 0 %
Lymphocytes Relative: 32 %
Lymphs Abs: 2 10*3/uL (ref 0.7–4.0)
MCH: 29.6 pg (ref 26.0–34.0)
MCHC: 32.8 g/dL (ref 30.0–36.0)
MCV: 90.2 fL (ref 80.0–100.0)
Monocytes Absolute: 0.6 10*3/uL (ref 0.1–1.0)
Monocytes Relative: 9 %
Neutro Abs: 3.5 10*3/uL (ref 1.7–7.7)
Neutrophils Relative %: 56 %
Platelets: 236 10*3/uL (ref 150–400)
RBC: 4.09 MIL/uL (ref 3.87–5.11)
RDW: 13 % (ref 11.5–15.5)
WBC: 6.4 10*3/uL (ref 4.0–10.5)
nRBC: 0 % (ref 0.0–0.2)

## 2023-03-27 LAB — LIPID PANEL
Cholesterol: 176 mg/dL (ref 0–200)
HDL: 69 mg/dL (ref >40)
LDL Cholesterol: 94 mg/dL (ref 0–99)
Total CHOL/HDL Ratio: 2.6 ratio
Triglycerides: 65 mg/dL (ref <150)
VLDL: 13 mg/dL (ref 0–40)

## 2023-03-27 LAB — VITAMIN D 25 HYDROXY (VIT D DEFICIENCY, FRACTURES): Vit D, 25-Hydroxy: 28.68 ng/mL — ABNORMAL LOW (ref 30–100)

## 2023-03-27 NOTE — Telephone Encounter (Signed)
Spoke with patient and called [facility] and faxed lipid orders.

## 2023-03-27 NOTE — Addendum Note (Signed)
Addended by: Alm Bustard R on: 03/27/2023 11:28 AM   Modules accepted: Orders

## 2023-04-02 ENCOUNTER — Ambulatory Visit: Payer: Commercial Managed Care - PPO | Admitting: Physician Assistant

## 2023-04-02 ENCOUNTER — Inpatient Hospital Stay (HOSPITAL_BASED_OUTPATIENT_CLINIC_OR_DEPARTMENT_OTHER): Payer: Commercial Managed Care - PPO | Admitting: Oncology

## 2023-04-02 VITALS — BP 122/67 | HR 73 | Temp 98.8°F | Resp 16 | Wt 151.2 lb

## 2023-04-02 DIAGNOSIS — Z170 Estrogen receptor positive status [ER+]: Secondary | ICD-10-CM

## 2023-04-02 DIAGNOSIS — E559 Vitamin D deficiency, unspecified: Secondary | ICD-10-CM | POA: Diagnosis not present

## 2023-04-02 DIAGNOSIS — M810 Age-related osteoporosis without current pathological fracture: Secondary | ICD-10-CM

## 2023-04-02 DIAGNOSIS — C50412 Malignant neoplasm of upper-outer quadrant of left female breast: Secondary | ICD-10-CM | POA: Diagnosis not present

## 2023-04-02 NOTE — Progress Notes (Signed)
[facility] [address], [place] [address]   CLINIC:  Medical Oncology/Hematology  PCP:  Babs Sciara, MD [address] / [place] [address] [phone]   REASON FOR VISIT:  Follow-up for history of left-sided breast cancer  PRIOR THERAPY: Lumpectomy and sentinel lymph node biopsy (10/31/2019) & radiation therapy  NGS Results: Not done  CURRENT THERAPY: Tamoxifen  BRIEF ONCOLOGIC HISTORY:   Oncology History  Mass of upper outer quadrant of left breast (Resolved)  08/17/2019 Initial Diagnosis   Mass of upper outer quadrant of left breast   11/11/2019 Genetic Testing        Malignant neoplasm of upper-outer quadrant of left female breast (HCC)  09/15/2019 Initial Diagnosis   Malignant neoplasm of upper-outer quadrant of left female breast (HCC)   09/15/2019 Cancer Staging   Staging form: Breast, AJCC 8th Edition - Clinical stage from 09/15/2019: Stage IIA (cT2, cN1(f), cM0, G2, ER+, PR+, HER2-) - Signed by Doreatha Massed, MD on 11/28/2019     CANCER STAGING:  Cancer Staging  Malignant neoplasm of upper-outer quadrant of left female breast [facility]) Staging form: Breast, AJCC 8th Edition - Clinical stage from 09/15/2019: Stage IIA (cT2, cN1(f), cM0, G2, ER+, PR+, HER2-) - Signed by Doreatha Massed, MD on 11/28/2019   INTERVAL HISTORY:   Ms. Monica Russell, a 62 y.o. female, returns for routine follow-up of her left-sided breast cancer (2021). Monica Russell was last seen on 09/28/2022.  She had a mammogram on 11/17/2022 which was read as BI-RADS Category 1 negative.  Repeat mammogram in 1 year.  She also had a bone density scan on 02/27/2023 which showed a T-score of -3.3.  Previous bone density from 03/02/2020 showed a T-score of -3.4.  Today, she overall feels well.  Energy and appetite are 75%.  She continues tamoxifen and is tolerating this well.  Has an occasional hot flash.  She continues to care for her husband who has Lewy body  dementia.  She denies any new lumps or bumps.  She is currently on Fosamax weekly.  Reports she has not started taking the organic vitamin D supplements that were recommended to her at her last visit.  In the past, she has developed significant redness around her eyes and lip swelling with several different types of vitamin D supplements.  She is reluctant to try anything additional.   ASSESSMENT & PLAN:  1.  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 -Oncotype DX score was 9 with 9-year distant recurrence with tamoxifen/AI was 12%.  No benefit from adjuvant chemotherapy. - She completed radiation therapy to the left breast. -Anastrozole started on 11/28/2019.  Changed to tamoxifen due to osteoporosis. - She is tolerating tamoxifen very well.  Reports mild intermittent hot flashes and night sweats. - Physical examination did not reveal any palpable masses or adenopathy. - She had a mammogram on 11/17/2022 which was read as BI-RADS Category 1 negative.  Repeat mammogram in 1 year. -Reviewed lab work from 03/27/2023 which showed a sodium level of 132 and a vitamin D level of 28.68.  CBC was normal. -Recommend she continue tamoxifen daily. -Return to clinic in 6 months with lab work a few days before.   2.  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   3.  Osteoporosis: -Bone density scan on 02/27/2023 which showed a T-score of -3.3.  Previous bone density from 03/02/2020 showed a T-score of -3.4. - She is on Fosamax 70 mg weekly. - She denies any bone pain, jaw pain, or fractures since being on bisphosphonate -Most recent vitamin D level was low at 28.68. -She is reluctant to take any additional vitamin D  supplements due to reactions in the past. -Considering taking in all natural organic vitamin D supplement without dye.  She has ordered on [place] and will start taking in the next couple of weeks. -Continue Fosamax 70 mg weekly and consider trying vitamin D supplements given levels are low.  Continue weightbearing exercises as needed.  Return to clinic in 6 months for follow-up with labs a few days before.   4.  Vitamin D deficiency: - Reports that she has tried 3 different formulations of vitamin D, and each time she had bilateral eye swelling and facial rash within 24 hours. - Most recent vitamin D level is 28.68. -Consider trying the all-natural/organic vitamin D without dye to see if you have same reaction.  States she has ordered this and will try in the next few weeks.  Will let us know if she is unable to tolerate.  PLAN SUMMARY: >> RTC in 6 months for follow-up with labs (cbc/c, cmp, vitamin d) a few days before.    REVIEW OF SYSTEMS:   Review of Systems  Constitutional:  Positive for fatigue.  Respiratory:  Positive for cough.     PHYSICAL EXAM:   Performance status (ECOG): 0 - Asymptomatic  Vitals:   04/02/23 0921  BP: 122/67  Pulse: 73  Resp: 16  Temp: 98.8 F (37.1 C)  SpO2: 100%   Wt Readings from Last 3 Encounters:  04/02/23 151 lb 3.8 oz (68.6 kg)  10/10/22 151 lb 6.4 oz (68.7 kg)  09/26/22 149 lb 1.6 oz (67.6 kg)   Physical Exam Constitutional:      Appearance: Normal appearance.  Cardiovascular:     Rate and Rhythm: Normal rate and regular rhythm.  Pulmonary:     Effort: Pulmonary effort is normal.     Breath sounds: Normal breath sounds.  Chest:  Breasts:    Right: Normal.     Left: Normal.     Comments: Breast exam unremarkable.  No areas of concern.  No lymphadenopathy. Abdominal:     General: Bowel sounds are normal.     Palpations: Abdomen is soft.  Musculoskeletal:        General: No swelling. Normal range of motion.  Lymphadenopathy:      Upper Body:     Right upper body: No supraclavicular, axillary or pectoral adenopathy.     Left upper body: No supraclavicular, axillary or pectoral adenopathy.  Neurological:     Mental Status: She is alert and oriented to person, place, and time. Mental status is at baseline.      PAST MEDICAL/SURGICAL HISTORY:  Past Medical History:  Diagnosis Date   Breast cancer (HCC)    High cholesterol    IBS (irritable bowel syndrome)    Renal cell cancer [facility])    Past Surgical History:  Procedure Laterality Date   BIOPSY  11/20/2021   Procedure: BIOPSY;  Surgeon: Dolores Frame, MD;  [facility];  Service: Gastroenterology;;  small bowel and GE junction   BREAST LUMPECTOMY WITH RADIOACTIVE SEED AND SENTINEL LYMPH NODE  BIOPSY Left 10/31/2019   Procedure: LEFT BREAST LUMPECTOMY X 2 WITH RADIOACTIVE SEED AND SENTINEL LYMPH NODE BIOPSY, LEFT AXILLARY SENTINEL NODE RADIOACTIVE SEED GUIDED EXCISION;  Surgeon: Emelia Loron, MD;  Location: [facility];  Service: General;  Laterality: Left;   CATARACT EXTRACTION Bilateral    06/2021   CHOLECYSTECTOMY     COLONOSCOPY  04/28/2012   Rehman: scattered diverticula throughout colon, colonic anastomosis at 18cm from anal margin, small external hemorrhoids   COLONOSCOPY WITH PROPOFOL N/A 05/21/2022   Procedure: COLONOSCOPY WITH PROPOFOL;  Surgeon: Dolores Frame, MD;  Location: [facility];  Service: Gastroenterology;  Laterality: N/A;  935 ASA 2   ESOPHAGOGASTRODUODENOSCOPY (EGD) WITH PROPOFOL N/A 11/20/2021   Procedure: ESOPHAGOGASTRODUODENOSCOPY (EGD) WITH PROPOFOL;  Surgeon: Dolores Frame, MD;  Location: [facility];  Service: Gastroenterology;  Laterality: N/A;  1215   PARTIAL COLECTOMY     for diverticulitis   PARTIAL NEPHRECTOMY     11 yrs ago for a renal carcinoma, right   POLYPECTOMY  05/21/2022   Procedure: POLYPECTOMY;  Surgeon: Dolores Frame, MD;  [facility]: [facility];  Service: Gastroenterology;;    SOCIAL HISTORY:  Social History   Socioeconomic History   Marital status: Widowed    Spouse name: Not on file   Number of children: 2   Years of education: Not on file   Highest education level: Not on file  Occupational History   Occupation: Retired  Tobacco Use   Smoking status: Every Day    Current packs/day: 0.50    Average packs/day: 0.5 packs/day for 40.0 years (20.0 ttl pk-yrs)    Types: Cigarettes    Passive exposure: Current   Smokeless tobacco: Never   Tobacco comments:    10-12 a day  Vaping Use   Vaping status: Never Used  Substance and Sexual Activity   Alcohol use: No    Alcohol/week: 0.0 standard drinks of alcohol    Comment: rarely   Drug use: No   Sexual activity: Not Currently    Birth control/protection: Post-menopausal  Other Topics Concern   Not on file  Social History Narrative   Pt's husband has dementia   Social Determinants of Health   Financial Resource Strain: Low Risk  (09/06/2021)   Overall Financial Resource Strain (CARDIA)    Difficulty of Paying Living Expenses: Not hard at all  Food Insecurity: No Food Insecurity (09/06/2021)   Hunger Vital Sign    Worried About Running Out of Food in the Last Year: Never true    Ran Out of Food in the Last Year: Never true  Transportation Needs: No Transportation Needs (09/06/2021)   PRAPARE - Administrator, Civil Service (Medical): No    Lack of Transportation (Non-Medical): No  Physical Activity: Insufficiently Active (09/06/2021)   Exercise Vital Sign    Days of Exercise per Week: 2 days    Minutes of Exercise per Session: 30 min  Stress: Stress Concern Present (09/06/2021)   [employer] of Occupational Health - Occupational Stress Questionnaire    Feeling of Stress : To some extent  Social Connections: Moderately Integrated (09/06/2021)   Social Connection and Isolation Panel [NHANES]    Frequency of Communication with Friends and  Family: Three times a week    Frequency of Social Gatherings with Friends and Family: Once a week    Attends Religious Services: More than 4 times per year    Active Member of Clubs or Organizations: No  Attends Banker Meetings: Never    Marital Status: Married  Catering manager Violence: Not At Risk (09/06/2021)   Humiliation, Afraid, Rape, and Kick questionnaire    Fear of Current or Ex-Partner: No    Emotionally Abused: No    Physically Abused: No    Sexually Abused: No    FAMILY HISTORY:  Family History  Problem Relation Age of Onset   Heart disease Maternal Grandmother    Diabetes Maternal Grandmother    Heart disease Maternal Grandfather    Dementia Maternal Grandfather    Hypertension Mother    Mental illness Mother    Mental illness Brother     CURRENT MEDICATIONS:  Current Outpatient Medications  Medication Sig Dispense Refill   albuterol (PROVENTIL) (2.5 MG/3ML) 0.083% nebulizer solution Take 3 mLs (2.5 mg total) by nebulization every 6 (six) hours as needed for wheezing or shortness of breath. 75 mL 12   alendronate (FOSAMAX) 70 MG tablet TAKE 1 TABLET BY MOUTH ONCE WEEKLY WITH A FULL GLASS OF WATER ON AN EMPTY STOMACH 44 tablet 0   cholecalciferol (QC VITAMIN D3) 10 MCG (400 UNIT) TABS tablet Take 400 Units by mouth every evening.     diclofenac (VOLTAREN) 75 MG EC tablet Take 1 tablet (75 mg total) by mouth 2 (two) times daily as needed for moderate pain. 60 tablet 0   dicyclomine (BENTYL) 10 MG capsule Take 1 capsule (10 mg total) by mouth every 12 (twelve) hours as needed for spasms (abdominal pain). 60 capsule 0   famotidine (PEPCID) 20 MG tablet Take 20 mg by mouth at bedtime as needed for heartburn or indigestion.     pantoprazole (PROTONIX) 40 MG tablet TAKE 1 TABLET(40 MG) BY MOUTH DAILY 90 tablet 3   Probiotic Product (ALIGN) 4 MG CAPS Take 4 mg by mouth in the morning.     Respiratory Therapy Supplies (NEBULIZER/TUBING/MOUTHPIECE) KIT Use as  directed 1 kit 0   rosuvastatin (CRESTOR) 20 MG tablet Take 1 tablet (20 mg total) by mouth daily. 90 tablet 0   tamoxifen (NOLVADEX) 20 MG tablet TAKE 1 TABLET BY MOUTH DAILY (Patient taking differently: Take 20 mg by mouth every evening.) 120 tablet 2   No current facility-administered medications for this visit.    ALLERGIES:  Allergies  Allergen Reactions   Penicillins Hives   Vitamin D Analogs Rash    LABORATORY DATA:  I have reviewed the labs as listed.     Latest Ref Rng & Units 03/27/2023   11:15 AM 08/26/2022    1:12 PM 02/17/2022   11:31 AM  CBC  WBC 4.0 - 10.5 K/uL 6.4  6.6  7.0   Hemoglobin 12.0 - 15.0 g/dL 54.0  98.1  19.1   Hematocrit 36.0 - 46.0 % 36.9  36.2  37.1   Platelets 150 - 400 K/uL 236  246  242       Latest Ref Rng & Units 03/27/2023   11:15 AM 08/26/2022    1:12 PM 02/17/2022   11:31 AM  CMP  Glucose 70 - 99 mg/dL 478  295  621   BUN 8 - 23 mg/dL 5  5  <5   Creatinine 3.08 - 1.00 mg/dL 6.57  8.46  9.62   Sodium 135 - 145 mmol/L 132  136  137   Potassium 3.5 - 5.1 mmol/L 3.9  3.4  3.7   Chloride 98 - 111 mmol/L 98  103  107   CO2 22 - 32 mmol/L  24  25  24    Calcium 8.9 - 10.3 mg/dL 8.6  8.7  8.8   Total Protein 6.5 - 8.1 g/dL 6.8  7.0  7.5   Total Bilirubin 0.3 - 1.2 mg/dL 0.5  0.1  0.4   Alkaline Phos 38 - 126 U/L 37  33  36   AST 15 - 41 U/L 14  16  15    ALT 0 - 44 U/L 11  11  11      DIAGNOSTIC IMAGING:  I have independently reviewed the scans and discussed with the patient. No results found.   WRAP UP:  All questions were answered. The patient knows to call the clinic with any problems, questions or concerns.  Medical decision making: Moderate  Time spent on visit: I spent 20 minutes counseling the patient face to face. The total time spent in the appointment was 30 minutes and more than 50% was on counseling.  Durenda Hurt, NP 04/02/2023 11:35 AM

## 2023-04-03 ENCOUNTER — Encounter: Payer: Self-pay | Admitting: Nurse Practitioner

## 2023-04-03 ENCOUNTER — Ambulatory Visit: Payer: Commercial Managed Care - PPO | Admitting: Nurse Practitioner

## 2023-04-03 VITALS — BP 124/82 | HR 81 | Temp 98.1°F | Ht 67.0 in | Wt 149.0 lb

## 2023-04-03 DIAGNOSIS — R0782 Intercostal pain: Secondary | ICD-10-CM

## 2023-04-03 DIAGNOSIS — M62838 Other muscle spasm: Secondary | ICD-10-CM

## 2023-04-03 DIAGNOSIS — R221 Localized swelling, mass and lump, neck: Secondary | ICD-10-CM | POA: Insufficient documentation

## 2023-04-03 DIAGNOSIS — K219 Gastro-esophageal reflux disease without esophagitis: Secondary | ICD-10-CM

## 2023-04-03 DIAGNOSIS — Z23 Encounter for immunization: Secondary | ICD-10-CM | POA: Diagnosis not present

## 2023-04-03 DIAGNOSIS — R062 Wheezing: Secondary | ICD-10-CM | POA: Insufficient documentation

## 2023-04-03 DIAGNOSIS — Z720 Tobacco use: Secondary | ICD-10-CM

## 2023-04-03 MED ORDER — ROSUVASTATIN CALCIUM 20 MG PO TABS: 20.0000 mg | ORAL_TABLET | Freq: Every day | ORAL | 1 refills | Status: DC

## 2023-04-03 MED ORDER — BUDESONIDE-FORMOTEROL FUMARATE 160-4.5 MCG/ACT IN AERO: 2.0000 | INHALATION_SPRAY | Freq: Two times a day (BID) | RESPIRATORY_TRACT | 2 refills | Status: DC

## 2023-04-03 MED ORDER — ALBUTEROL SULFATE HFA 108 (90 BASE) MCG/ACT IN AERS: 2.0000 | INHALATION_SPRAY | RESPIRATORY_TRACT | 0 refills | Status: AC | PRN

## 2023-04-03 NOTE — Progress Notes (Signed)
Subjective:    Patient ID: Monica Russell, female    DOB: 1961/06/01, 62 y.o.   MRN: [id number]  HPI 6 month follow up - hyperlipidemia - refill med Patient is requesting albuterol an or symbicort inhaler Has been able to cut back her smoking to half pack or less.  Unable to quit at this time due to stressors.  Her husband passed away back in November 06, 2022.  Patient has an excellent support system.  Has been staying very busy.  Would like a refill on her albuterol and Symbicort inhalers due to weather change and fall allergy season.  Has experienced some slight wheezing at times.  Her hairdresser recently noted a soft not on the back of the right side of the neck about 2 months ago.  Patient did not know it was there until it was pointed out to her.  No numbness or weakness of the arms or legs.  No radiating pain.  Nontender.  Has had some upper back/neck tightness and spasms.  Occasional popping and cracking when she rotates her neck. Taking her pantoprazole daily for GERD.  Had her endoscopy in 2023. Tries to eat healthy.  Eating out more than usual since she is by herself.  Tries to cook meals for her family. Staying active, goes to the gym on occasion.  Review of Systems  Constitutional:  Positive for fatigue. Negative for fever.  HENT:  Negative for sore throat and trouble swallowing.   Respiratory:  Positive for wheezing. Negative for cough, chest tightness and shortness of breath.        Minimal wheezing.  Cardiovascular:  Positive for chest pain. Negative for leg swelling.       Mild localized lower right anterior chest wall pain.  Intermittent.  Has not identified any specific triggers other than occasionally with a deep breath.  No unusual shortness of breath.  Neurological:  Negative for weakness and numbness.      04/03/2023    9:13 AM  Depression screen PHQ 2/9  Decreased Interest 3  Down, Depressed, Hopeless 1  PHQ - 2 Score 4  Altered sleeping 1  Tired, decreased energy 1   Change in appetite 1  Feeling bad or failure about yourself  1  Trouble concentrating 1  Moving slowly or fidgety/restless 0  Suicidal thoughts 0  PHQ-9 Score 9  Difficult doing work/chores Not difficult at all      04/03/2023    9:14 AM 10/10/2022    3:56 PM 09/06/2021   12:37 PM  GAD 7 : Generalized Anxiety Score  Nervous, Anxious, on Edge 0 1 1  Control/stop worrying 0 1 0  Worry too much - different things 1 1 1   Trouble relaxing 1 1 1   Restless 1 1 1   Easily annoyed or irritable 0 0 1  Afraid - awful might happen 0 1 0  Total GAD 7 Score 3 6 5   Anxiety Difficulty Not difficult at all Not difficult at all          Objective:   Physical Exam NAD.  Alert, oriented.  Fluctuant raised well-defined cystic lesion noted in the right mid cervical area.  Nontender to palpation.  Extremely tight tender muscles noted in the trapezius and rhomboid area of the neck more on the left side.  Active ROM of the neck.  Lungs: Breath sounds mildly diminished in general with very faint inspiratory wheezes noted upper lobes posteriorly.  No tachypnea.  Heart regular rate rhythm.  Arm and  hand strength 5+ bilaterally.  Strong radial pulses. Today's Vitals   04/03/23 0903  BP: 124/82  Pulse: 81  Temp: 98.1 F (36.7 C)  SpO2: 98%  Weight: 149 lb (67.6 kg)  Height: 5\' 7"  (1.702 m)   Body mass index is 23.34 kg/m. Labs up-to-date from 03/27/2023.       Assessment & Plan:   Problem List Items Addressed This Visit       Digestive   GERD (gastroesophageal reflux disease) - Primary     Other   Mass of right side of neck   Relevant Orders   US Soft Tissue Head/Neck (NON-THYROID)   Tobacco use   Wheezing   Other Visit Diagnoses     Intercostal pain       Relevant Orders   DG Chest 2 View   Immunization due       Relevant Orders   Flu vaccine trivalent PF, 6mos and older(Flulaval,Afluria,Fluarix,Fluzone) (Completed)   Muscle spasms of neck       Relevant Orders   [place] Soft  Tissue Head/Neck (NON-THYROID)      Meds ordered this encounter  Medications   albuterol (VENTOLIN HFA) 108 (90 Base) MCG/ACT inhaler    Sig: Inhale 2 puffs into the lungs every 4 (four) hours as needed for wheezing or shortness of breath.    Dispense:  8 g    Refill:  0    Order Specific Question:   Supervising Provider    Answer:   Lilyan Punt A [[id number]]   budesonide-formoterol (SYMBICORT) 160-4.5 MCG/ACT inhaler    Sig: Inhale 2 puffs into the lungs 2 (two) times daily. To prevent wheezing    Dispense:  10.2 each    Refill:  2    Order Specific Question:   Supervising Provider    Answer:   Lilyan Punt A [[id number]]   rosuvastatin (CRESTOR) 20 MG tablet    Sig: Take 1 tablet (20 mg total) by mouth daily.    Dispense:  90 tablet    Refill:  1    Change simvastatin    Order Specific Question:   Supervising Provider    Answer:   Lilyan Punt A [[id number]]   Continue to work on weaning off tobacco is much as possible.  At any point in time if she is ready to quit, let us know if she would like to try medication.  Chest x-ray ordered.  Defers referral for lung cancer screening at this time.  Refill on Symbicort and albuterol especially for the fall season.  Use as directed.  Call back if no improvement. Continue pantoprazole as directed. Schedule ultrasound of the cervical spine area to evaluate cystic lesion.  May need further evaluation depending on results. Continue rosuvastatin as directed. Flu vaccine today. Return in about 6 months (around 10/01/2023).

## 2023-04-07 ENCOUNTER — Ambulatory Visit (HOSPITAL_COMMUNITY)
Admission: RE | Admit: 2023-04-07 | Discharge: 2023-04-07 | Disposition: A | Discharge status: Home / Self Care | Payer: Commercial Managed Care - PPO | Source: Ambulatory Visit | Attending: Nurse Practitioner | Admitting: Nurse Practitioner

## 2023-04-07 DIAGNOSIS — R221 Localized swelling, mass and lump, neck: Secondary | ICD-10-CM | POA: Diagnosis present

## 2023-04-07 DIAGNOSIS — R0782 Intercostal pain: Secondary | ICD-10-CM | POA: Insufficient documentation

## 2023-04-07 DIAGNOSIS — M62838 Other muscle spasm: Secondary | ICD-10-CM | POA: Diagnosis present

## 2023-04-11 ENCOUNTER — Encounter: Payer: Self-pay | Admitting: Nurse Practitioner

## 2023-04-16 ENCOUNTER — Ambulatory Visit: Payer: Commercial Managed Care - PPO | Admitting: Nurse Practitioner

## 2023-04-16 VITALS — BP 156/78 | HR 77 | Temp 98.8°F | Ht 67.0 in | Wt 150.6 lb

## 2023-04-16 DIAGNOSIS — R053 Chronic cough: Secondary | ICD-10-CM | POA: Diagnosis not present

## 2023-04-16 DIAGNOSIS — J300 Vasomotor rhinitis: Secondary | ICD-10-CM

## 2023-04-16 DIAGNOSIS — R1110 Vomiting, unspecified: Secondary | ICD-10-CM

## 2023-04-16 MED ORDER — LEVOFLOXACIN 500 MG PO TABS: 500.0000 mg | ORAL_TABLET | Freq: Every day | ORAL | 0 refills | Status: DC

## 2023-04-16 MED ORDER — PREDNISONE 20 MG PO TABS: ORAL_TABLET | ORAL | 0 refills | Status: DC

## 2023-04-17 ENCOUNTER — Encounter: Payer: Self-pay | Admitting: Nurse Practitioner

## 2023-04-17 NOTE — Progress Notes (Signed)
Subjective:    Patient ID: Monica Russell, female    DOB: 1961/06/16, 62 y.o.   MRN: [id number]  HPI Presents for recheck of her cough.  See previous note 04/03/2023.  Now producing yellow thick mucus.  Describes as "a deep cough".  Some posttussive vomiting.  Continues to have off-and-on discomfort along the right lower ribs near the anterior axillary line.  Mainly bothers her when she sits down and bends forward.  Has been able to work out at the gym with no significant change in her breathing and no coughing.  Has a long-term history of issues with GERD and vomiting after eating certain foods.  No sore throat or ear pain.  No fever.  Minimal wheezing.  No sore throat or ear pain.  Mild head congestion but no sinus headache or pressure.  Smokes about half a pack per day on average.   Review of Systems  Constitutional:  Positive for fatigue. Negative for fever.  HENT:  Negative for ear pain, sinus pressure, sinus pain, sore throat and trouble swallowing.   Respiratory:  Positive for cough. Negative for chest tightness, shortness of breath and wheezing.   Cardiovascular:  Negative for chest pain.  Gastrointestinal:  Positive for vomiting.       Objective:   Physical Exam NAD.  Alert, oriented.  TMs retracted bilaterally, no erythema.  Pharynx mildly erythematous with PND noted.  Neck supple with mild soft anterior adenopathy.  Lungs breath sounds diminished in general, very rare faint expiratory wheeze or crackles.  No tachypnea.  Normal color.  Heart regular rate rhythm.  Right lateral chest wall no rash noted.  Minimal tenderness to palpation of the right lower lateral rib area.  Area is very limited and specific according to patient. Chest x-ray dated 04/03/2023 shows lung hyperexpansion and chronic bronchitic changes.  Today's Vitals   04/16/23 0850  BP: (!) 156/78  Pulse: 77  Temp: 98.8 F (37.1 C)  SpO2: 99%  Weight: 150 lb 9.6 oz (68.3 kg)  Height: 5\' 7"  (1.702 m)   Body mass  index is 23.59 kg/m.       Assessment & Plan:   Problem List Items Addressed This Visit       Other   Persistent cough - Primary   Other Visit Diagnoses     Vasomotor rhinitis       Post-tussive vomiting          Meds ordered this encounter  Medications   levofloxacin (LEVAQUIN) 500 MG tablet    Sig: Take 1 tablet (500 mg total) by mouth daily.    Dispense:  10 tablet    Refill:  0    Order Specific Question:   Supervising Provider    Answer:   Lilyan Punt A [[id number]]   predniSONE (DELTASONE) 20 MG tablet    Sig: Take 2 tabs po every day x 5 days    Dispense:  10 tablet    Refill:  0    Order Specific Question:   Supervising Provider    Answer:   Babs Sciara [[id number]]   Patient indicates that Levaquin tends to work best in the past.  Start Levaquin and prednisone as directed. Recommend that patient follow-up with gastroenterology for her persistent issues.  May need to consider CT scan of the abdomen if discomfort persists. Also discussed probable diagnosis of COPD based on x-ray and exam.  Consider lung function testing to confirm diagnosis. Warning signs reviewed. Call back next week  if no improvement, sooner if worse.

## 2023-04-30 ENCOUNTER — Ambulatory Visit (INDEPENDENT_AMBULATORY_CARE_PROVIDER_SITE_OTHER): Payer: Commercial Managed Care - PPO | Admitting: Gastroenterology

## 2023-04-30 ENCOUNTER — Encounter (INDEPENDENT_AMBULATORY_CARE_PROVIDER_SITE_OTHER): Payer: Self-pay | Admitting: Gastroenterology

## 2023-04-30 VITALS — BP 136/63 | HR 71 | Temp 97.5°F | Ht 67.0 in | Wt 149.1 lb

## 2023-04-30 DIAGNOSIS — R109 Unspecified abdominal pain: Secondary | ICD-10-CM | POA: Diagnosis not present

## 2023-04-30 DIAGNOSIS — R1010 Upper abdominal pain, unspecified: Secondary | ICD-10-CM | POA: Insufficient documentation

## 2023-04-30 DIAGNOSIS — R110 Nausea: Secondary | ICD-10-CM

## 2023-04-30 DIAGNOSIS — R112 Nausea with vomiting, unspecified: Secondary | ICD-10-CM

## 2023-04-30 MED ORDER — ONDANSETRON 4 MG PO TBDP: 4.0000 mg | ORAL_TABLET | Freq: Three times a day (TID) | ORAL | 0 refills | Status: DC | PRN

## 2023-04-30 NOTE — Patient Instructions (Addendum)
Start Zofran 4 mg q8h as needed for nausea Continue taking dicyclomine every 12 hours as needed for abdominal pain, can increase to every 8 hours if having significant abdominal pain Please let us know if having persistent symptoms a week from now.  Will consider doing a CT of the abdomen and pelvis with IV contrast at that time. Try eating bland food for now and slowly advance diet as tolerated

## 2023-04-30 NOTE — Progress Notes (Signed)
Monica Russell, M.D. Gastroenterology & Hepatology [facility] Gastroenterology [address] [place], [place] [address]  Primary Care Physician: Babs Sciara, MD [address] [place] [address]  I will communicate my assessment and recommendations to the referring MD via EMR.  Problems: Acute right upper quadrant abdominal pain IBS  History of Present Illness: Monica Russell is a 62 y.o. female with past medical history of breast cancer, IBS, GERD, hyperlipidemia and renal cell cancer, who presents for evaluation of abdominal pain.  The patient was last seen on 03/13/2022. At that time, the patient was advised to continue a low FODMAP diet.  Was also advised to continue pantoprazole 40 mg every day for GERD.  She was scheduled for screening colonoscopy with findings described below.  Patient reports that she has presented abdominal pain in the RUQ and nausea for the last couple of weeks. She reports that she has had some chills but no fever. She states her pain is mainly located around the right side of the rib cage and it radiates to the epigastric area. She has had significant nausea, but no vomiting today, although she has vomited. She is passing significant amount of flatulence, for which she has been taking Gas-X. She had diarrhea for a 3 days, and has now has resolved.  She has tried to avoid common triggers of her previous symptoms such as dairy, bread and "very rich food"  She has taken Emetrol and Tylenol for her symptoms. She just started taking Bentyl to relieve her abdominal discomfort.  The patient denies having any hematochezia, melena, hematemesis,  diarrhea, jaundice, pruritus or weight loss.  Last Endoscopy: 11/20/21 - Salmon-colored mucosa suspicious for Barrett's esophagus. Biopsied(short segment barretts with intestinal metaplasia) - 1 cm hiatal hernia. - Normal stomach. - Normal examined duodenum. Biopsied-no specific  pathologic diagnosis   Last colonoscopy 05/21/22 Two sessile polyps were found in the rectum and transverse colon. The polyps were 2 to 5 mm in size. These polyps were removed with a cold snare. Resection and retrieval were complete.  A few small- mouthed diverticula were found in the sigmoid colon.  Non- bleeding internal hemorrhoids were found during retroflexion. The hemorrhoids were small.   Recommendations:  Repeat EGD in 5 years and colonoscopy in 7 years  Past Medical History: Past Medical History:  Diagnosis Date   Breast cancer (HCC)    High cholesterol    IBS (irritable bowel syndrome)    Renal cell cancer [facility])     Past Surgical History: Past Surgical History:  Procedure Laterality Date   BIOPSY  11/20/2021   Procedure: BIOPSY;  Surgeon: Marguerita Merles, Reuel Boom, MD;  [facility];  Service: Gastroenterology;;  small bowel and GE junction   BREAST LUMPECTOMY WITH RADIOACTIVE SEED AND SENTINEL LYMPH NODE BIOPSY Left 10/31/2019   Procedure: LEFT BREAST LUMPECTOMY X 2 WITH RADIOACTIVE SEED AND SENTINEL LYMPH NODE BIOPSY, LEFT AXILLARY SENTINEL NODE RADIOACTIVE SEED GUIDED EXCISION;  Surgeon: Emelia Loron, MD;  Location: [facility];  Service: General;  Laterality: Left;   CATARACT EXTRACTION Bilateral    06/2021   CHOLECYSTECTOMY     COLONOSCOPY  04/28/2012   Rehman: scattered diverticula throughout colon, colonic anastomosis at 18cm from anal margin, small external hemorrhoids   COLONOSCOPY WITH PROPOFOL N/A 05/21/2022   Procedure: COLONOSCOPY WITH PROPOFOL;  Surgeon: Dolores Frame, MD;  [facility];  Service: Gastroenterology;  Laterality: N/A;  935 ASA 2  ESOPHAGOGASTRODUODENOSCOPY (EGD) WITH PROPOFOL N/A 11/20/2021   Procedure: ESOPHAGOGASTRODUODENOSCOPY (EGD) WITH PROPOFOL;  Surgeon: Dolores Frame, MD;  [facility];  Service: Gastroenterology;  Laterality: N/A;  1215   PARTIAL COLECTOMY      for diverticulitis   PARTIAL NEPHRECTOMY     11 yrs ago for a renal carcinoma, right   POLYPECTOMY  05/21/2022   Procedure: POLYPECTOMY;  Surgeon: Marguerita Merles, Reuel Boom, MD;  [facility];  Service: Gastroenterology;;    Family History: Family History  Problem Relation Age of Onset   Heart disease Maternal Grandmother    Diabetes Maternal Grandmother    Heart disease Maternal Grandfather    Dementia Maternal Grandfather    Hypertension Mother    Mental illness Mother    Mental illness Brother     Social History: Social History   Tobacco Use  Smoking Status Every Day   Current packs/day: 0.50   Average packs/day: 0.5 packs/day for 40.0 years (20.0 ttl pk-yrs)   Types: Cigarettes   Passive exposure: Current  Smokeless Tobacco Never  Tobacco Comments   10-12 a day   Social History   Substance and Sexual Activity  Alcohol Use No   Alcohol/week: 0.0 standard drinks of alcohol   Comment: rarely   Social History   Substance and Sexual Activity  Drug Use No    Allergies: Allergies  Allergen Reactions   Penicillins Hives   Vitamin D Analogs Rash    Medications: Current Outpatient Medications  Medication Sig Dispense Refill   albuterol (PROVENTIL) (2.5 MG/3ML) 0.083% nebulizer solution Take 3 mLs (2.5 mg total) by nebulization every 6 (six) hours as needed for wheezing or shortness of breath. 75 mL 12   albuterol (VENTOLIN HFA) 108 (90 Base) MCG/ACT inhaler Inhale 2 puffs into the lungs every 4 (four) hours as needed for wheezing or shortness of breath. 8 g 0   alendronate (FOSAMAX) 70 MG tablet TAKE 1 TABLET BY MOUTH ONCE WEEKLY WITH A FULL GLASS OF WATER ON AN EMPTY STOMACH 44 tablet 0   budesonide-formoterol (SYMBICORT) 160-4.5 MCG/ACT inhaler Inhale 2 puffs into the lungs 2 (two) times daily. To prevent wheezing 10.2 each 2   dicyclomine (BENTYL) 10 MG capsule Take 1 capsule (10 mg total) by mouth every 12 (twelve) hours as needed for spasms  (abdominal pain). 60 capsule 0   pantoprazole (PROTONIX) 40 MG tablet TAKE 1 TABLET(40 MG) BY MOUTH DAILY 90 tablet 3   Probiotic Product (ALIGN) 4 MG CAPS Take 4 mg by mouth in the morning.     rosuvastatin (CRESTOR) 20 MG tablet Take 1 tablet (20 mg total) by mouth daily. 90 tablet 1   tamoxifen (NOLVADEX) 20 MG tablet TAKE 1 TABLET BY MOUTH DAILY (Patient taking differently: Take 20 mg by mouth every evening.) 120 tablet 2   cholecalciferol (QC VITAMIN D3) 10 MCG (400 UNIT) TABS tablet Take 400 Units by mouth every evening. (Patient not taking: Reported on 04/30/2023)     No current facility-administered medications for this visit.    Review of Systems: GENERAL: negative for malaise, night sweats HEENT: No changes in hearing or vision, no nose bleeds or other nasal problems. NECK: Negative for lumps, goiter, pain and significant neck swelling RESPIRATORY: Negative for cough, wheezing CARDIOVASCULAR: Negative for chest pain, leg swelling, palpitations, orthopnea GI: SEE HPI MUSCULOSKELETAL: Negative for joint pain or swelling, back pain, and muscle pain. SKIN: Negative for lesions, rash PSYCH: Negative for sleep disturbance, mood disorder and recent psychosocial  stressors. HEMATOLOGY Negative for prolonged bleeding, bruising easily, and swollen nodes. ENDOCRINE: Negative for cold or heat intolerance, polyuria, polydipsia and goiter. NEURO: negative for tremor, gait imbalance, syncope and seizures. The remainder of the review of systems is noncontributory.   Physical Exam: BP 136/63 (BP Location: Left Arm, Patient Position: Sitting, Cuff Size: Normal)   Pulse 71   Temp (!) 97.5 F (36.4 C) (Temporal)   Ht 5\' 7"  (1.702 m)   Wt 149 lb 1.6 oz (67.6 kg)   BMI 23.35 kg/m  GENERAL: The patient is AO x3, in no acute distress. HEENT: Head is normocephalic and atraumatic. EOMI are intact. Mouth is well hydrated and without lesions. NECK: Supple. No masses LUNGS: Clear to auscultation.  No presence of rhonchi/wheezing/rales. Adequate chest expansion HEART: RRR, normal s1 and s2. ABDOMEN: Soft, nontender but uncomfortable upon epigastric palpation, no guarding, no peritoneal signs, and nondistended. BS +. No masses. EXTREMITIES: Without any cyanosis, clubbing, rash, lesions or edema. NEUROLOGIC: AOx3, no focal motor deficit. SKIN: no jaundice, no rashes  Imaging/Labs: as above  I personally reviewed and interpreted the available labs, imaging and endoscopic files.  Impression and Plan: Monica Russell is a 62 y.o. female with past medical history of breast cancer, IBS, GERD, hyperlipidemia and renal cell cancer, who presents for evaluation of abdominal pain.  The patient has presented recent onset of abdominal pain and nausea of unclear etiology.  Given the time of her symptoms, I suspect this could have been related to a gastroenteritis episode, especially as she had some transient diarrhea.  For now we will attempt to treat this symptomatically with Zofran as needed for nausea and dicyclomine as needed for abdominal pain episodes.  If she were to have worsening or persistent symptoms a week from now, we may consider doing a CT of the abdomen and pelvis with IV contrast.  Patient agreed understood.  - Start Zofran 4 mg q8h as needed for nausea - Continue taking dicyclomine every 12 hours as needed for abdominal pain, can increase to every 8 hours if having significant abdominal pain - Please let us know if having persistent symptoms a week from now.  Will consider doing a CT of the abdomen and pelvis with IV contrast at that time. -Try eating bland food for now and slowly advance diet as tolerated  All questions were answered.      Monica Blazing, MD Gastroenterology and Hepatology [facility] Gastroenterology

## 2023-05-09 IMAGING — MG DIGITAL DIAGNOSTIC BILAT W/ TOMO W/ CAD
6 of 10 series · 6 of 26 positions shown · non-contrast
Comparison: Previous exams.

CLINICAL DATA: 60-year-old female with history of left breast
cancer post lumpectomy 10/31/2019.

EXAM:
DIGITAL DIAGNOSTIC BILATERAL MAMMOGRAM WITH TOMOSYNTHESIS AND CAD
TECHNIQUE: Bilateral digital diagnostic mammography and breast tomosynthesis
was performed. The images were evaluated with computer-aided
detection.

[L ML (1 of 2)]
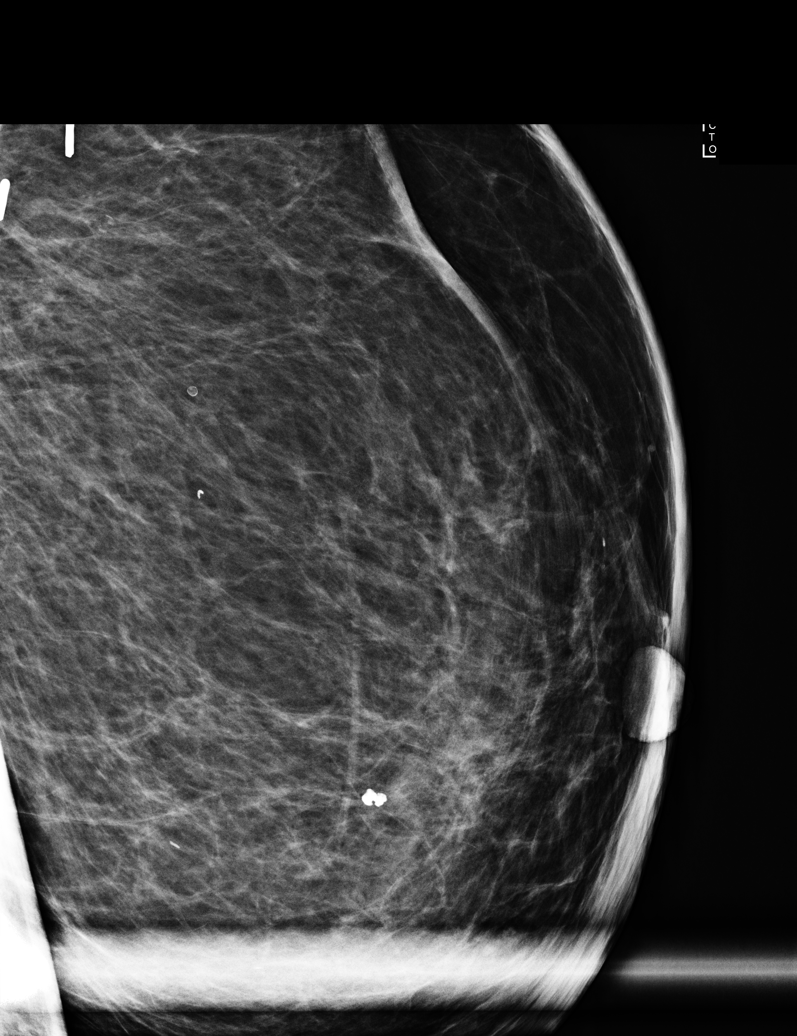

[L ML (2 of 2)]
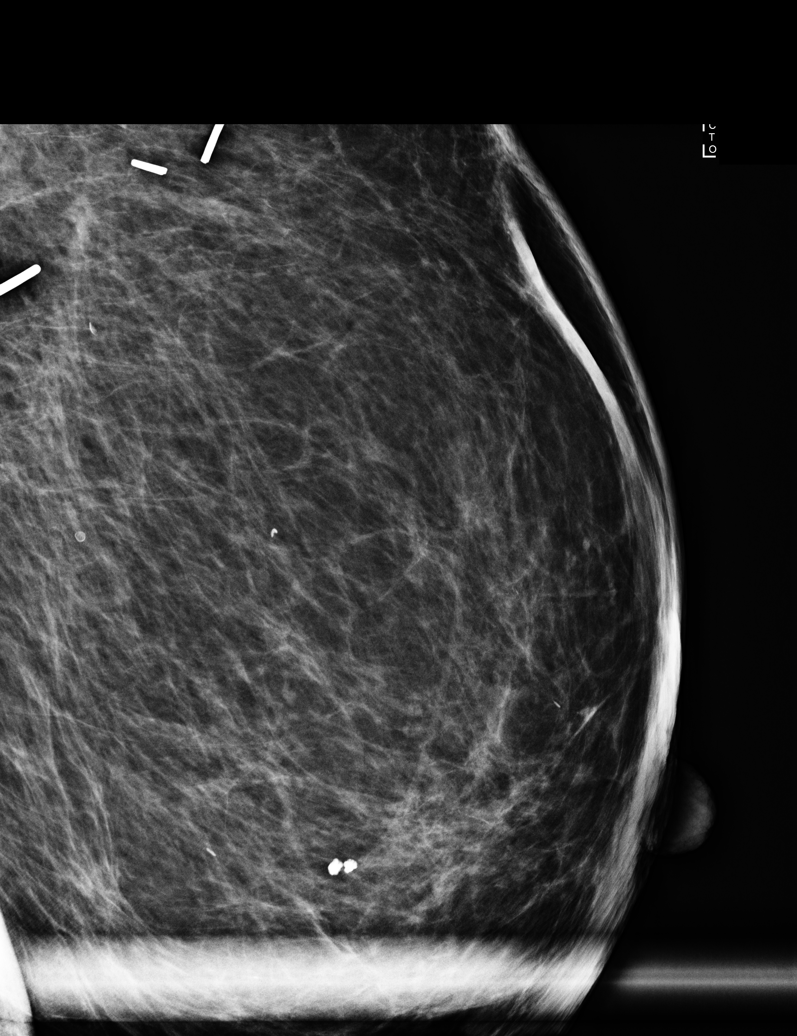

[L MLO synth-2D]
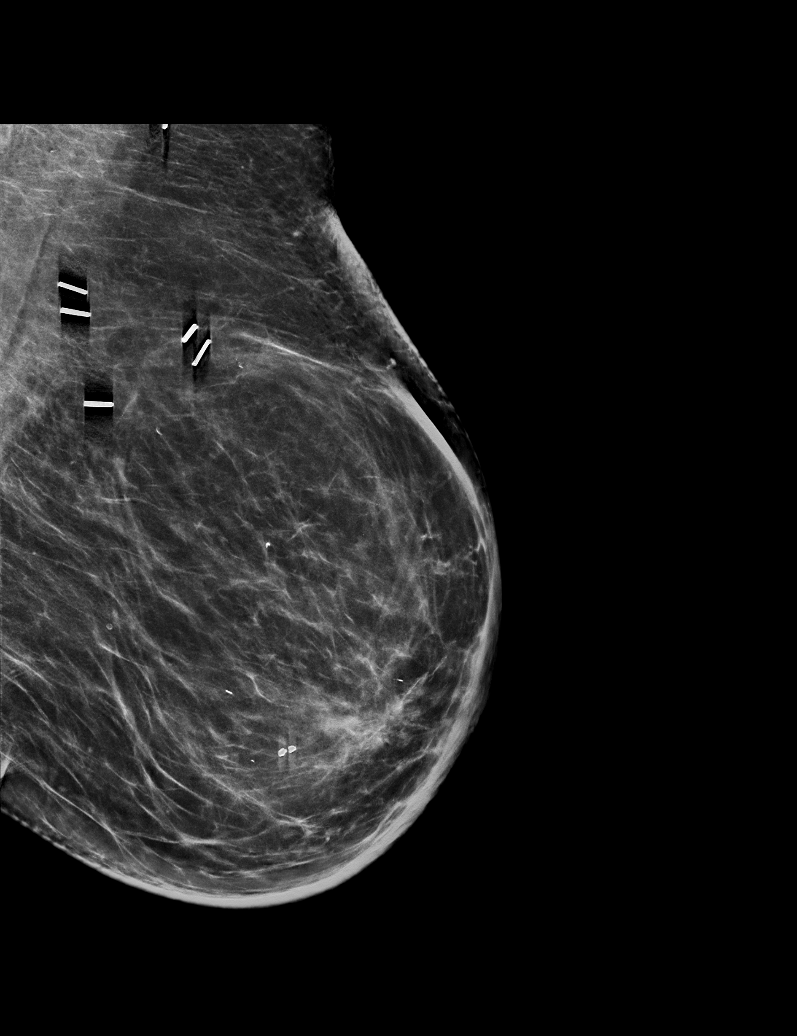

[L CC synth-2D]
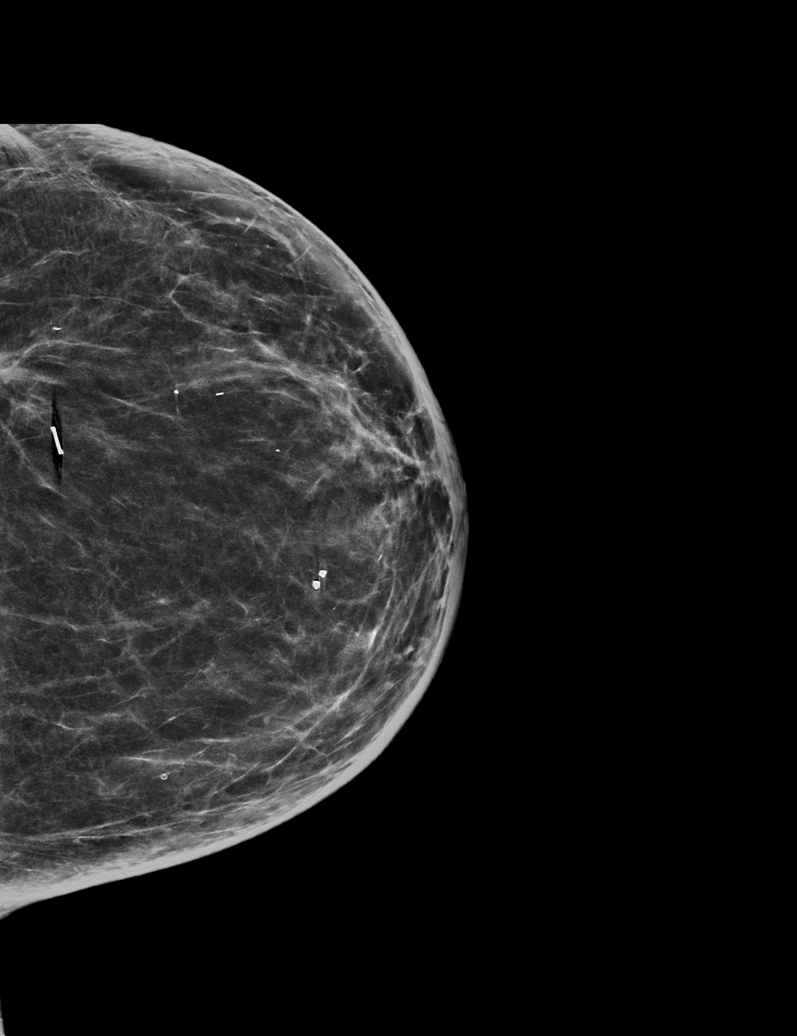

[R CC synth-2D]
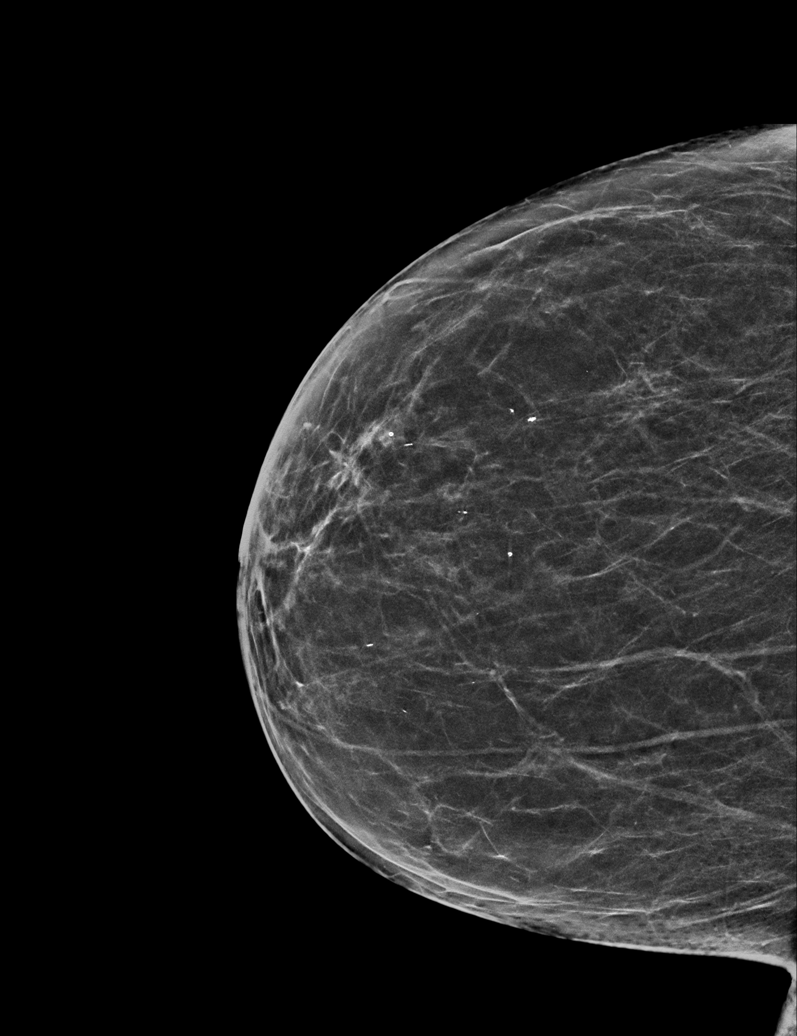

[R MLO synth-2D]
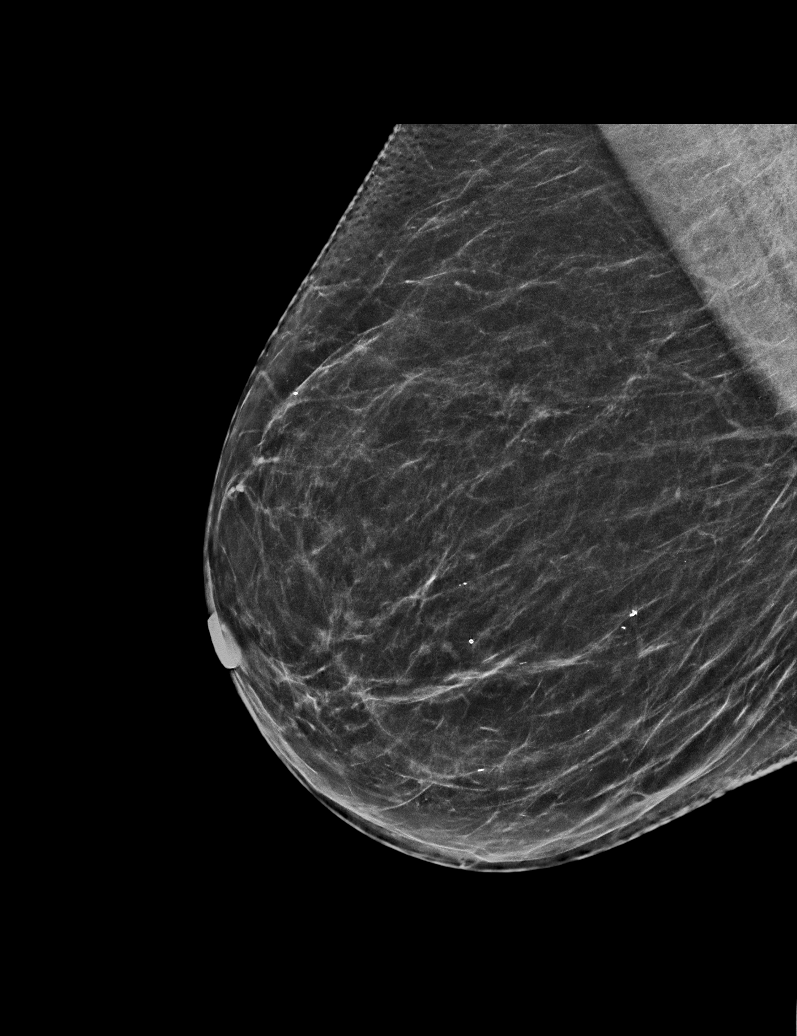

[6 of 26 positions shown; findings below may reference images not displayed]

ACR Breast Density Category b: There are scattered areas of
fibroglandular density.
FINDINGS: No suspicious masses or calcifications are seen in either breast.
Lumpectomy changes again identified in the upper outer far posterior
left breast. Spot compression magnification views of the left breast
lumpectomy site were performed. There is no mammographic evidence of
locally recurrent malignancy.
IMPRESSION: Stable lumpectomy changes in left breast. No mammographic evidence
of malignancy in either breast.

RECOMMENDATION:
1.  Screening mammogram in one year.(Code:[id number]-[id number])

2. The patient is now two or more years post lumpectomy and
therefore may return to annual screening mammography, however given
history of breast cancer the patient does remain eligible for annual
diagnostic mammography if indicated.

I have discussed the findings and recommendations with the patient.
If applicable, a reminder letter will be sent to the patient
regarding the next appointment.

BI-RADS CATEGORY  2: Benign.

## 2023-05-16 ENCOUNTER — Other Ambulatory Visit: Payer: Self-pay | Admitting: Hematology

## 2023-05-18 ENCOUNTER — Encounter (HOSPITAL_COMMUNITY): Payer: Self-pay | Admitting: Hematology

## 2023-06-09 ENCOUNTER — Telehealth: Payer: Self-pay | Admitting: Family Medicine

## 2023-06-09 ENCOUNTER — Other Ambulatory Visit: Payer: Self-pay

## 2023-06-09 MED ORDER — BUDESONIDE-FORMOTEROL FUMARATE 160-4.5 MCG/ACT IN AERO: 2.0000 | INHALATION_SPRAY | Freq: Two times a day (BID) | RESPIRATORY_TRACT | 2 refills | Status: DC

## 2023-06-09 NOTE — Telephone Encounter (Signed)
Sent Rx ( Symbicort) requested to [facility] on [address]

## 2023-06-09 NOTE — Telephone Encounter (Signed)
Refill on budesonide-formoterol (SYMBICORT) 160-4.5 MCG/ACT inhaler send to [facility] [address]

## 2023-07-06 ENCOUNTER — Ambulatory Visit (INDEPENDENT_AMBULATORY_CARE_PROVIDER_SITE_OTHER): Payer: Commercial Managed Care - PPO | Admitting: Gastroenterology

## 2023-07-06 ENCOUNTER — Encounter (INDEPENDENT_AMBULATORY_CARE_PROVIDER_SITE_OTHER): Payer: Self-pay | Admitting: Gastroenterology

## 2023-07-06 VITALS — BP 147/81 | HR 83 | Temp 98.1°F | Ht 67.0 in | Wt 144.1 lb

## 2023-07-06 DIAGNOSIS — K589 Irritable bowel syndrome without diarrhea: Secondary | ICD-10-CM

## 2023-07-06 DIAGNOSIS — K580 Irritable bowel syndrome with diarrhea: Secondary | ICD-10-CM

## 2023-07-06 DIAGNOSIS — R143 Flatulence: Secondary | ICD-10-CM

## 2023-07-06 MED ORDER — DICYCLOMINE HCL 10 MG PO CAPS: 10.0000 mg | ORAL_CAPSULE | Freq: Two times a day (BID) | ORAL | 0 refills | Status: DC | PRN

## 2023-07-06 MED ORDER — RIFAXIMIN 550 MG PO TABS: 550.0000 mg | ORAL_TABLET | Freq: Three times a day (TID) | ORAL | 0 refills | Status: AC

## 2023-07-06 NOTE — Patient Instructions (Signed)
You can continue with dicyclomine as you are doing and avoiding trigger foods You can try over the counter IB gard which may also help some with your bloating I have sent xifaxan 550mg  to be taken 3 times per day to see if we can get this covered by insurance to treat your symptoms, this may require some extra paperwork so it may be a few days before this goes through  Follow up 3 months  It was a pleasure to see you today. I want to create trusting relationships with patients and provide genuine, compassionate, and quality care. I truly value your feedback! please be on the lookout for a survey regarding your visit with me today. I appreciate your input about our visit and your time in completing this!    Monica Russell L. [name], MSN, APRN, AGNP-C Adult-Gerontology Nurse Practitioner [facility] Gastroenterology at [facility]

## 2023-07-06 NOTE — Progress Notes (Signed)
Referring Provider: Babs Sciara, MD Primary Care Physician:  Babs Sciara, MD Primary GI Physician: Dr. Levon Hedger   Chief Complaint  Patient presents with   Gas    Follow up on GAS. States she is using phayzme dicyclomine. She has been watching her diet.    HPI:   Monica Russell is a 62 y.o. female with past medical history of breast cancer, IBS, GERD, hyperlipidemia and renal cell cancer   Patient presenting today for follow up of IBS   Last seen October 2024, at that time having some right upper quadrant pain and nausea for the past couple weeks.  Also reported significant amount of flatulence which she is taking Gas-X.  Had diarrhea for 3 days but has not resolved.  Trying to avoid common triggers in her diet.  Has taken Emetrol and Tylenol for her symptoms and recently started Bentyl.  Patient admitted start Zofran 4 mg every 8 hours as needed, continue dicyclomine every 12 hours as needed, consider CT of abdomen pelvis with contrast if symptoms persisting past 1 week, try eating bland food and advance diet as tolerated.  Present:  Patient states she is continuing to have a lot of gas. She has taken dicyclomine, pepto, gas x, phazyme.she continues to have a lot of flatulence noting her stools also have a foul odor. She is doing some ginger which helps if her stomach is upset but does not help her gas.she is trying to watch what she eats and avoid trigger foods. She does not have abdominal pain but notes a lot of bloating and rumbling. The dicyclomine does calm down her bowels some from being so noisy. Avoids gluten as bread seems to tear her stomach up. usually having atleast 4 BMs per day. Nausea and vomiting has resolved from her last visit. No rectal bleeding or melena.   Last Endoscopy: 11/20/21 - Salmon-colored mucosa suspicious for Barrett's esophagus. Biopsied(short segment barretts with intestinal metaplasia) - 1 cm hiatal hernia. - Normal stomach. - Normal examined  duodenum. Biopsied-no specific pathologic diagnosis  Last colonoscopy 05/21/22 Two sessile polyps were found in the rectum and transverse colon. The polyps were 2 to 5 mm in size. These polyps were removed with a cold snare. Resection and retrieval were complete.   A few small- mouthed diverticula were found in the sigmoid colon.   Non- bleeding internal hemorrhoids were found during retroflexion. The hemorrhoids were small.   Recommendations:  Repeat EGD in 5 years and colonoscopy in 7 years   Past Medical History:  Diagnosis Date   Breast cancer (HCC)    High cholesterol    IBS (irritable bowel syndrome)    Renal cell cancer [facility])     Past Surgical History:  Procedure Laterality Date   BIOPSY  11/20/2021   Procedure: BIOPSY;  Surgeon: Marguerita Merles, Reuel Boom, MD;  [facility];  Service: Gastroenterology;;  small bowel and GE junction   BREAST LUMPECTOMY WITH RADIOACTIVE SEED AND SENTINEL LYMPH NODE BIOPSY Left 10/31/2019   Procedure: LEFT BREAST LUMPECTOMY X 2 WITH RADIOACTIVE SEED AND SENTINEL LYMPH NODE BIOPSY, LEFT AXILLARY SENTINEL NODE RADIOACTIVE SEED GUIDED EXCISION;  Surgeon: Emelia Loron, MD;  Location: [facility];  Service: General;  Laterality: Left;   CATARACT EXTRACTION Bilateral    06/2021   CHOLECYSTECTOMY     COLONOSCOPY  04/28/2012   Rehman: scattered diverticula throughout colon, colonic anastomosis at 18cm from anal margin, small external hemorrhoids   COLONOSCOPY WITH PROPOFOL N/A  05/21/2022   Procedure: COLONOSCOPY WITH PROPOFOL;  Surgeon: Dolores Frame, MD;  [facility];  Service: Gastroenterology;  Laterality: N/A;  935 ASA 2   ESOPHAGOGASTRODUODENOSCOPY (EGD) WITH PROPOFOL N/A 11/20/2021   Procedure: ESOPHAGOGASTRODUODENOSCOPY (EGD) WITH PROPOFOL;  Surgeon: Dolores Frame, MD;  [facility];  Service: Gastroenterology;  Laterality: N/A;  1215   PARTIAL COLECTOMY     for  diverticulitis   PARTIAL NEPHRECTOMY     11 yrs ago for a renal carcinoma, right   POLYPECTOMY  05/21/2022   Procedure: POLYPECTOMY;  Surgeon: Marguerita Merles, Reuel Boom, MD;  [facility];  Service: Gastroenterology;;    Current Outpatient Medications  Medication Sig Dispense Refill   albuterol (PROVENTIL) (2.5 MG/3ML) 0.083% nebulizer solution Take 3 mLs (2.5 mg total) by nebulization every 6 (six) hours as needed for wheezing or shortness of breath. 75 mL 12   albuterol (VENTOLIN HFA) 108 (90 Base) MCG/ACT inhaler Inhale 2 puffs into the lungs every 4 (four) hours as needed for wheezing or shortness of breath. 8 g 0   alendronate (FOSAMAX) 70 MG tablet TAKE 1 TABLET BY MOUTH ONCE WEEKLY WITH A FULL GLASS OF WATER ON AN EMPTY STOMACH 44 tablet 0   budesonide-formoterol (SYMBICORT) 160-4.5 MCG/ACT inhaler Inhale 2 puffs into the lungs 2 (two) times daily. To prevent wheezing 10.2 each 2   dicyclomine (BENTYL) 10 MG capsule Take 1 capsule (10 mg total) by mouth every 12 (twelve) hours as needed for spasms (abdominal pain). 60 capsule 0   pantoprazole (PROTONIX) 40 MG tablet TAKE 1 TABLET(40 MG) BY MOUTH DAILY 90 tablet 3   Probiotic Product (ALIGN) 4 MG CAPS Take 4 mg by mouth in the morning.     rosuvastatin (CRESTOR) 20 MG tablet Take 1 tablet (20 mg total) by mouth daily. 90 tablet 1   tamoxifen (NOLVADEX) 20 MG tablet TAKE 1 TABLET BY MOUTH DAILY 120 tablet 2   ondansetron (ZOFRAN-ODT) 4 MG disintegrating tablet Take 1 tablet (4 mg total) by mouth every 8 (eight) hours as needed for nausea or vomiting. (Patient not taking: Reported on 07/06/2023) 90 tablet 0   No current facility-administered medications for this visit.    Allergies as of 07/06/2023 - Review Complete 07/06/2023  Allergen Reaction Noted   Penicillins Hives 04/07/2012   Vitamin d analogs Rash 08/17/2019    Family History  Problem Relation Age of Onset   Heart disease Maternal Grandmother    Diabetes  Maternal Grandmother    Heart disease Maternal Grandfather    Dementia Maternal Grandfather    Hypertension Mother    Mental illness Mother    Mental illness Brother     Social History   Socioeconomic History   Marital status: Widowed    Spouse name: Not on file   Number of children: 2   Years of education: Not on file   Highest education level: 12th grade  Occupational History   Occupation: Retired  Tobacco Use   Smoking status: Every Day    Current packs/day: 0.50    Average packs/day: 0.5 packs/day for 40.0 years (20.0 ttl pk-yrs)    Types: Cigarettes    Passive exposure: Current   Smokeless tobacco: Never   Tobacco comments:    10-12 a day  Vaping Use   Vaping status: Never Used  Substance and Sexual Activity   Alcohol use: No    Alcohol/week: 0.0 standard drinks of alcohol    Comment: rarely   Drug  use: No   Sexual activity: Not Currently    Birth control/protection: Post-menopausal  Other Topics Concern   Not on file  Social History Narrative   Pt's husband has dementia   Social Drivers of Corporate investment banker Strain: Low Risk  (04/14/2023)   Overall Financial Resource Strain (CARDIA)    Difficulty of Paying Living Expenses: Not hard at all  Food Insecurity: No Food Insecurity (04/14/2023)   Hunger Vital Sign    Worried About Running Out of Food in the Last Year: Never true    Ran Out of Food in the Last Year: Never true  Transportation Needs: No Transportation Needs (04/14/2023)   PRAPARE - Administrator, Civil Service (Medical): No    Lack of Transportation (Non-Medical): No  Physical Activity: Sufficiently Active (04/14/2023)   Exercise Vital Sign    Days of Exercise per Week: 4 days    Minutes of Exercise per Session: 40 min  Stress: Stress Concern Present (04/14/2023)   [employer] of Occupational Health - Occupational Stress Questionnaire    Feeling of Stress : To some extent  Social Connections: Unknown (04/14/2023)    Social Connection and Isolation Panel [NHANES]    Frequency of Communication with Friends and Family: More than three times a week    Frequency of Social Gatherings with Friends and Family: Twice a week    Attends Religious Services: Patient declined    Database administrator or Organizations: No    Attends Engineer, structural: Not on file    Marital Status: Widowed    Review of systems General: negative for malaise, night sweats, fever, chills, weight loss Neck: Negative for lumps, goiter, pain and significant neck swelling Resp: Negative for cough, wheezing, dyspnea at rest CV: Negative for chest pain, leg swelling, palpitations, orthopnea GI: denies melena, hematochezia, nausea, vomiting, constipation, dysphagia, odyonophagia, early satiety or unintentional weight loss.  + Loose stools + bloating + flatulence MSK: Negative for joint pain or swelling, back pain, and muscle pain. Derm: Negative for itching or rash Psych: Denies depression, anxiety, memory loss, confusion. No homicidal or suicidal ideation.  Heme: Negative for prolonged bleeding, bruising easily, and swollen nodes. Endocrine: Negative for cold or heat intolerance, polyuria, polydipsia and goiter. Neuro: negative for tremor, gait imbalance, syncope and seizures. The remainder of the review of systems is noncontributory.  Physical Exam: BP (!) 147/81 (BP Location: Left Arm, Patient Position: Sitting, Cuff Size: Normal)   Pulse 83   Temp 98.1 F (36.7 C) (Oral)   Ht 5\' 7"  (1.702 m)   Wt 144 lb 1.6 oz (65.4 kg)   BMI 22.57 kg/m  General:   Alert and oriented. No distress noted. Pleasant and cooperative.  Head:  Normocephalic and atraumatic. Eyes:  Conjuctiva clear without scleral icterus. Mouth:  Oral mucosa pink and moist. Good dentition. No lesions. Heart: Normal rate and rhythm, s1 and s2 heart sounds present.  Lungs: Clear lung sounds in all lobes. Respirations equal and unlabored. Abdomen:  +BS, soft,  non-tender and non-distended. No rebound or guarding. No HSM or masses noted. Derm: No palmar erythema or jaundice Msk:  Symmetrical without gross deformities. Normal posture. Extremities:  Without edema. Neurologic:  Alert and  oriented x4 Psych:  Alert and cooperative. Normal mood and affect.  Invalid input(s): "6 MONTHS"   ASSESSMENT: Monica Russell is a 62 y.o. female presenting today for IBS   Patient continues to have a lot of gas, bloating and  looser stools.  She is using dicyclomine as well as Gas-X, Phazyme and trying to watch her diet though gas and bloating do not seem improved.  At this time would recommend trial of Xifaxan 550 mg 3 times daily x 14 days to see if this helps with her symptoms.  She can continue with Bentyl 10 mg twice daily as needed and can also in the meantime try IBgard for flatulence/bloating until Xifaxan hopefully gets approved by her insurance.  She should continue to avoid trigger foods is much as possible.   PLAN:  Xifaxan 550mg  TID x14 days  2. Continue bentyl 10mg  BID PRN  3. Avoid trigger foods  4. Ib gard for flatulence/bloating   All questions were answered, patient verbalized understanding and is in agreement with plan as outlined above.    Follow Up: 3 months   Jalisa Sacco L. [name], MSN, APRN, AGNP-C Adult-Gerontology Nurse Practitioner [facility]

## 2023-07-21 ENCOUNTER — Encounter (INDEPENDENT_AMBULATORY_CARE_PROVIDER_SITE_OTHER): Payer: Self-pay

## 2023-07-21 NOTE — Telephone Encounter (Signed)
 Forms filled out and placed on your desk for signature, diagnosis and the test wanted to be ordered. Once signed I will have to get the patient signature to send off for the cost of testing.

## 2023-07-23 NOTE — Telephone Encounter (Signed)
 I spoke with the patient she says she will come by the [facility] location on 07/24/2023 to sign the forms to ok sending the test kit to her home and the billing issues with this.

## 2023-07-24 NOTE — Telephone Encounter (Signed)
 Patient came by the office today and signed her forms for the [facility] testing for Glucose. I have faxed the requisition with the patient's insurance card to [phone].  -------Fax Transmission Report-------  To:               Recipient at [phone] Subject:          Rec for Trio smart test Cindel D Result:           The transmission was successful. Explanation:      All Pages Ok Pages Sent:       4 Connect Time:     2 minutes, 50 seconds Transmit Time:    07/24/2023 10:13 Transfer Rate:    14400 Status Code:      0000 Retry Count:      0 Job Id:           [id number] Unique Id:        [id number] Fax Line:         23 Fax Server:       [id number]

## 2023-09-03 ENCOUNTER — Ambulatory Visit: Payer: Commercial Managed Care - PPO | Admitting: Family Medicine

## 2023-09-07 ENCOUNTER — Telehealth (INDEPENDENT_AMBULATORY_CARE_PROVIDER_SITE_OTHER): Payer: Self-pay

## 2023-09-07 ENCOUNTER — Encounter: Payer: Self-pay | Admitting: Family Medicine

## 2023-09-07 NOTE — Telephone Encounter (Signed)
 I spoke with the patient made her aware the SIMO test was negative. She says she is some what better, and symptoms not as prevalent since she has stopped her pantoprazole. She says she is still scared to leave the house due to issues with gas. She says she has to eat Tums now prn and takes gas x daily. Please advise if any further recommendations. Thanks.  Raquel James, NP  Joanne Gavel, Cierria Height L, CMA Can we let the patient know that SIMO testing was negative

## 2023-09-07 NOTE — Telephone Encounter (Signed)
 I spoke with the patient she says she only uses Tums as needed, mostly at night, but not every night, just when she eats the wrong things. She says the gas is mainly flatulence.

## 2023-09-07 NOTE — Telephone Encounter (Signed)
 Nurses The patient is scheduled to see cancer center in March Please asked the cancer center if they are willing to order lipid panel because patient is on statin and she does not want to get stuck twice?  If possible please have cancer center order her lipid profile  If they say that that is not possible send me a message thank you

## 2023-09-09 ENCOUNTER — Other Ambulatory Visit: Payer: Self-pay

## 2023-09-09 DIAGNOSIS — E782 Mixed hyperlipidemia: Secondary | ICD-10-CM

## 2023-09-10 NOTE — Telephone Encounter (Signed)
 I called and spoke with the patient advised to try OTC Ibgard or the generic equivalent, maybe could try OTC peppermint tea. She will try these and will let us know how she fairs with this. She is aware if no better by her appointment here on 10/05/2023 We will discuss other options at that time.

## 2023-09-14 ENCOUNTER — Ambulatory Visit: Payer: Commercial Managed Care - PPO | Admitting: Nurse Practitioner

## 2023-09-23 ENCOUNTER — Other Ambulatory Visit: Payer: Self-pay

## 2023-09-23 DIAGNOSIS — Z170 Estrogen receptor positive status [ER+]: Secondary | ICD-10-CM

## 2023-09-23 DIAGNOSIS — M810 Age-related osteoporosis without current pathological fracture: Secondary | ICD-10-CM

## 2023-09-23 DIAGNOSIS — E559 Vitamin D deficiency, unspecified: Secondary | ICD-10-CM

## 2023-09-24 ENCOUNTER — Inpatient Hospital Stay: Attending: Hematology | Admitting: Oncology

## 2023-09-24 ENCOUNTER — Encounter: Payer: Self-pay | Admitting: Family Medicine

## 2023-09-24 DIAGNOSIS — M810 Age-related osteoporosis without current pathological fracture: Secondary | ICD-10-CM | POA: Insufficient documentation

## 2023-09-24 DIAGNOSIS — E559 Vitamin D deficiency, unspecified: Secondary | ICD-10-CM | POA: Insufficient documentation

## 2023-09-24 DIAGNOSIS — Z79810 Long term (current) use of selective estrogen receptor modulators (SERMs): Secondary | ICD-10-CM | POA: Insufficient documentation

## 2023-09-24 DIAGNOSIS — Z170 Estrogen receptor positive status [ER+]: Secondary | ICD-10-CM | POA: Insufficient documentation

## 2023-09-24 DIAGNOSIS — E782 Mixed hyperlipidemia: Secondary | ICD-10-CM

## 2023-09-24 DIAGNOSIS — C50412 Malignant neoplasm of upper-outer quadrant of left female breast: Secondary | ICD-10-CM | POA: Insufficient documentation

## 2023-09-24 LAB — COMPREHENSIVE METABOLIC PANEL
ALT: 11 U/L (ref 0–44)
AST: 15 U/L (ref 15–41)
Albumin: 3.7 g/dL (ref 3.5–5.0)
Alkaline Phosphatase: 34 U/L — ABNORMAL LOW (ref 38–126)
Anion gap: 11 (ref 5–15)
BUN: <5 mg/dL — ABNORMAL LOW (ref 8–23)
CO2: 24 mmol/L (ref 22–32)
Calcium: 9 mg/dL (ref 8.9–10.3)
Chloride: 100 mmol/L (ref 98–111)
Creatinine, Ser: 0.57 mg/dL (ref 0.44–1.00)
GFR, Estimated: >60 mL/min (ref >60)
Glucose, Bld: 116 mg/dL — ABNORMAL HIGH (ref 70–99)
Potassium: 4 mmol/L (ref 3.5–5.1)
Sodium: 135 mmol/L (ref 135–145)
Total Bilirubin: 0.3 mg/dL (ref 0.0–1.2)
Total Protein: 6.9 g/dL (ref 6.5–8.1)

## 2023-09-24 LAB — CBC WITH DIFFERENTIAL/PLATELET
Abs Immature Granulocytes: 0.01 10*3/uL (ref 0.00–0.07)
Basophils Absolute: 0.1 10*3/uL (ref 0.0–0.1)
Basophils Relative: 1 %
Eosinophils Absolute: 0.2 10*3/uL (ref 0.0–0.5)
Eosinophils Relative: 4 %
HCT: 36.8 % (ref 36.0–46.0)
Hemoglobin: 12 g/dL (ref 12.0–15.0)
Immature Granulocytes: 0 %
Lymphocytes Relative: 30 %
Lymphs Abs: 1.9 10*3/uL (ref 0.7–4.0)
MCH: 29.3 pg (ref 26.0–34.0)
MCHC: 32.6 g/dL (ref 30.0–36.0)
MCV: 90 fL (ref 80.0–100.0)
Monocytes Absolute: 0.6 10*3/uL (ref 0.1–1.0)
Monocytes Relative: 9 %
Neutro Abs: 3.6 10*3/uL (ref 1.7–7.7)
Neutrophils Relative %: 56 %
Platelets: 264 10*3/uL (ref 150–400)
RBC: 4.09 MIL/uL (ref 3.87–5.11)
RDW: 13.2 % (ref 11.5–15.5)
WBC: 6.4 10*3/uL (ref 4.0–10.5)
nRBC: 0 % (ref 0.0–0.2)

## 2023-09-24 LAB — LIPID PANEL
Cholesterol: 178 mg/dL (ref 0–200)
HDL: 81 mg/dL (ref >40)
LDL Cholesterol: 87 mg/dL (ref 0–99)
Total CHOL/HDL Ratio: 2.2 ratio
Triglycerides: 50 mg/dL (ref <150)
VLDL: 10 mg/dL (ref 0–40)

## 2023-09-24 LAB — VITAMIN D 25 HYDROXY (VIT D DEFICIENCY, FRACTURES): Vit D, 25-Hydroxy: 6.29 ng/mL — ABNORMAL LOW (ref 30–100)

## 2023-09-25 ENCOUNTER — Other Ambulatory Visit: Payer: Commercial Managed Care - PPO

## 2023-10-01 ENCOUNTER — Inpatient Hospital Stay (HOSPITAL_BASED_OUTPATIENT_CLINIC_OR_DEPARTMENT_OTHER): Admitting: Oncology

## 2023-10-01 DIAGNOSIS — M810 Age-related osteoporosis without current pathological fracture: Secondary | ICD-10-CM | POA: Diagnosis not present

## 2023-10-01 DIAGNOSIS — E559 Vitamin D deficiency, unspecified: Secondary | ICD-10-CM

## 2023-10-01 DIAGNOSIS — Z170 Estrogen receptor positive status [ER+]: Secondary | ICD-10-CM

## 2023-10-01 DIAGNOSIS — C50412 Malignant neoplasm of upper-outer quadrant of left female breast: Secondary | ICD-10-CM | POA: Diagnosis not present

## 2023-10-01 NOTE — Progress Notes (Signed)
 Virtual Visit via Telephone Note  I connected with Monica Russell on 10/01/23 at  2:30 PM EDT by telephone and verified that I am speaking with the correct person using two identifiers.  Location: Patient: Home  Provider: Clinic    I discussed the limitations, risks, security and privacy concerns of performing an evaluation and management service by telephone and the availability of in person appointments. I also discussed with the patient that there may be a patient responsible charge related to this service. The patient expressed understanding and agreed to proceed.   History of Present Illness: Monica Russell is a 63 year old female with past medical history significant for left-sided breast cancer.  She was last seen in clinic on 04/02/2023 by me.  She is currently on tamoxifen and tolerating well.  Has occasional hot flashes.  Patient had bone density scan on 02/27/2023 which showed a T-score of -3.3.  She is currently on Fosamax 1 tablet weekly.  She is unable to tolerate vitamin D due to side effects.  Reports she has not been taking vitamin D because she is scared to.  She has purchased all-natural vitamin D but has not tried it.  She typically develops facial swelling and lip swelling and is nervous to try it again.   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  She denies any new lumps or bumps.  She spends most of her day outside and does not know why her vitamin D levels are low.  Reports most of the women in her family have been deficient in vitamin D as well.    Observations/Objective:Review of Systems  Respiratory:  Positive for cough and shortness of breath.     Physical Exam Neurological:     Mental Status: She is alert and oriented to person, place, and time.    Assessment and Plan: 1. Malignant neoplasm of upper-outer quadrant of left breast in female, estrogen receptor positive (HCC) (Primary) -Labs from 09/24/23 Show an Unremarkable CBC with  Differential and normal LFTs. -She is currently stable on tamoxifen. -Most recent mammogram is from 11/17/2022 which was read as BI-RADS Category 1 negative. -Repeat in May 2025.  2. Vitamin D deficiency -Vitamin D level is 6.9.  -She has been low for many years due to intolerance to vitamin D tablets.  -She is currently taking Fosamax for bone strengthening for her osteoporosis. -She is reluctant to try different types of vitamin D given previous reactions. -We discussed increasing vitamin D in her diet as well as possibly finding a children's vitamin D chewable vitamin and taking it with an antihistamine.  She will let me know how she tolerates.  3. Osteoporosis without current pathological fracture, unspecified osteoporosis type -Most current bone density scan from 02/27/2023 showed a T-score of -3.3. -She is currently on Fosamax weekly repeat and in 2 to 3 years.  Follow Up Instructions: -Recommend follow-up in 6 months with labs a few days before. -Mammogram in May 2025.   I discussed the assessment and treatment plan with the patient. The patient was provided an opportunity to ask questions and all were answered. The patient agreed with the plan and demonstrated an understanding of the instructions.   The patient was advised to call back or seek an in-person evaluation if the symptoms worsen or if the condition fails to improve as anticipated.  I provided 20 minutes of non-face-to-face time during this encounter.   Mauro Kaufmann, NP

## 2023-10-02 ENCOUNTER — Inpatient Hospital Stay: Payer: Commercial Managed Care - PPO | Admitting: Oncology

## 2023-10-05 ENCOUNTER — Encounter (INDEPENDENT_AMBULATORY_CARE_PROVIDER_SITE_OTHER): Payer: Self-pay | Admitting: Gastroenterology

## 2023-10-05 ENCOUNTER — Telehealth (INDEPENDENT_AMBULATORY_CARE_PROVIDER_SITE_OTHER): Payer: Self-pay

## 2023-10-05 ENCOUNTER — Ambulatory Visit (INDEPENDENT_AMBULATORY_CARE_PROVIDER_SITE_OTHER): Payer: Commercial Managed Care - PPO | Admitting: Gastroenterology

## 2023-10-05 VITALS — BP 137/81 | HR 80 | Temp 98.0°F | Ht 67.0 in | Wt 150.0 lb

## 2023-10-05 DIAGNOSIS — R140 Abdominal distension (gaseous): Secondary | ICD-10-CM | POA: Diagnosis not present

## 2023-10-05 DIAGNOSIS — K589 Irritable bowel syndrome without diarrhea: Secondary | ICD-10-CM

## 2023-10-05 DIAGNOSIS — R143 Flatulence: Secondary | ICD-10-CM | POA: Diagnosis not present

## 2023-10-05 DIAGNOSIS — K580 Irritable bowel syndrome with diarrhea: Secondary | ICD-10-CM

## 2023-10-05 NOTE — Progress Notes (Signed)
 Referring Provider: Babs Sciara, MD Primary Care Physician:  Babs Sciara, MD Primary GI Physician: Dr. Levon Hedger   Chief Complaint  Patient presents with   Gas    Follow up on gas. Stopped pantoprazole and got better for awhile but then gas came back.    HPI:   Monica Russell is a 63 y.o. female with past medical history of  breast cancer, IBS, GERD, hyperlipidemia and renal cell cancer, Barrett's esophagus  Patient presenting today for:  Follow up of IBS, flatulence, bloating  Last seen December 2024, at that time patient reported she continued to have a lot of gas.  Taking dicyclomine, Pepto, Gas-X, Phazyme.  Noting stools were not all over.  Ginger to see if this helps.  No abdominal pain but a lot of bloating.  Avoids gluten.  Having 4 bouts per day.  Nausea and vomiting resolved.  Patient recommended to do course of Xifaxan 550 mg 3 times daily x 14 days, continue Bentyl 10 mg twice daily as needed, avoid trigger foods, IBgard for flatulence/bloating.  SIBO/SIMO testing in February was negative  Present: Previous course of xifaxan did not improve her diarrhea. States she tried IBgard a few nights ago when she went out to eat with some friends. She notes that she started having a lot of gurgling in her stomach about 30 minutes after eating and had to go to the restroom to pass gas. She notes that she feels fully early and has noticed that breads/pastas tend to make things worse. She avoids going out to eat with people often due to gassiness. She takes gas x and pepto bismol if she is going out to eat as these two together seem to make her symptoms somewhat better. She even stopped her PPI and noted she had some improvement in symptoms for about 2 weeks when symptoms returned. She is not having GERD symptoms. She denies abdominal pain. Denies constipation or diarrhea. She has normal formed stools a few times per day. No nausea or vomiting.    Previous testing: Celiac panel  negative, SIMO/SIBO testing negative, course of xifaxan did not improve symptoms  Last Endoscopy: 11/20/21 - Salmon-colored mucosa suspicious for Barrett's esophagus. Biopsied(short segment barretts with intestinal metaplasia) - 1 cm hiatal hernia. - Normal stomach. - Normal examined duodenum. Biopsied-no specific pathologic diagnosis  Last colonoscopy 05/21/22 Two sessile polyps were found in the rectum and transverse colon. The polyps were 2 to 5 mm in size. These polyps were removed with a cold snare. Resection and retrieval were complete.   A few small- mouthed diverticula were found in the sigmoid colon.   Non- bleeding internal hemorrhoids were found during retroflexion. The hemorrhoids were small.   Recommendations:  Repeat EGD in 5 years and colonoscopy in 7 years  Filed Weights   10/05/23 1059  Weight: 150 lb (68 kg)     Past Medical History:  Diagnosis Date   Breast cancer (HCC)    High cholesterol    IBS (irritable bowel syndrome)    Renal cell cancer [facility])     Past Surgical History:  Procedure Laterality Date   BIOPSY  11/20/2021   Procedure: BIOPSY;  Surgeon: Marguerita Merles, Reuel Boom, MD;  [facility];  Service: Gastroenterology;;  small bowel and GE junction   BREAST LUMPECTOMY WITH RADIOACTIVE SEED AND SENTINEL LYMPH NODE BIOPSY Left 10/31/2019   Procedure: LEFT BREAST LUMPECTOMY X 2 WITH RADIOACTIVE SEED AND SENTINEL LYMPH NODE BIOPSY, LEFT AXILLARY SENTINEL NODE  RADIOACTIVE SEED GUIDED EXCISION;  Surgeon: Emelia Loron, MD;  Location: [facility];  Service: General;  Laterality: Left;   CATARACT EXTRACTION Bilateral    06/2021   CHOLECYSTECTOMY     COLONOSCOPY  04/28/2012   Rehman: scattered diverticula throughout colon, colonic anastomosis at 18cm from anal margin, small external hemorrhoids   COLONOSCOPY WITH PROPOFOL N/A 05/21/2022   Procedure: COLONOSCOPY WITH PROPOFOL;  Surgeon: Dolores Frame, MD;  [facility];  Service: Gastroenterology;  Laterality: N/A;  935 ASA 2   ESOPHAGOGASTRODUODENOSCOPY (EGD) WITH PROPOFOL N/A 11/20/2021   Procedure: ESOPHAGOGASTRODUODENOSCOPY (EGD) WITH PROPOFOL;  Surgeon: Dolores Frame, MD;  [facility];  Service: Gastroenterology;  Laterality: N/A;  1215   PARTIAL COLECTOMY     for diverticulitis   PARTIAL NEPHRECTOMY     11 yrs ago for a renal carcinoma, right   POLYPECTOMY  05/21/2022   Procedure: POLYPECTOMY;  Surgeon: Marguerita Merles, Reuel Boom, MD;  [facility];  Service: Gastroenterology;;    Current Outpatient Medications  Medication Sig Dispense Refill   albuterol (PROVENTIL) (2.5 MG/3ML) 0.083% nebulizer solution Take 3 mLs (2.5 mg total) by nebulization every 6 (six) hours as needed for wheezing or shortness of breath. 75 mL 12   albuterol (VENTOLIN HFA) 108 (90 Base) MCG/ACT inhaler Inhale 2 puffs into the lungs every 4 (four) hours as needed for wheezing or shortness of breath. 8 g 0   alendronate (FOSAMAX) 70 MG tablet TAKE 1 TABLET BY MOUTH ONCE WEEKLY WITH A FULL GLASS OF WATER ON AN EMPTY STOMACH 44 tablet 0   budesonide-formoterol (SYMBICORT) 160-4.5 MCG/ACT inhaler Inhale 2 puffs into the lungs 2 (two) times daily. To prevent wheezing 10.2 each 2   dicyclomine (BENTYL) 10 MG capsule Take 1 capsule (10 mg total) by mouth every 12 (twelve) hours as needed for spasms (abdominal pain). 60 capsule 0   Probiotic Product (ALIGN) 4 MG CAPS Take 4 mg by mouth in the morning.     rosuvastatin (CRESTOR) 20 MG tablet Take 1 tablet (20 mg total) by mouth daily. 90 tablet 1   tamoxifen (NOLVADEX) 20 MG tablet TAKE 1 TABLET BY MOUTH DAILY 120 tablet 2   pantoprazole (PROTONIX) 40 MG tablet TAKE 1 TABLET(40 MG) BY MOUTH DAILY (Patient not taking: Reported on 10/05/2023) 90 tablet 3   No current facility-administered medications for this visit.    Allergies as of 10/05/2023 - Review Complete 10/05/2023  Allergen Reaction  Noted   Penicillins Hives 04/07/2012   Vitamin d analogs Rash 08/17/2019    Social History   Socioeconomic History   Marital status: Widowed    Spouse name: Not on file   Number of children: 2   Years of education: Not on file   Highest education level: 12th grade  Occupational History   Occupation: Retired  Tobacco Use   Smoking status: Every Day    Current packs/day: 0.50    Average packs/day: 0.5 packs/day for 40.0 years (20.0 ttl pk-yrs)    Types: Cigarettes    Passive exposure: Current   Smokeless tobacco: Never   Tobacco comments:    10-12 a day  Vaping Use   Vaping status: Never Used  Substance and Sexual Activity   Alcohol use: No    Alcohol/week: 0.0 standard drinks of alcohol    Comment: rarely   Drug use: No   Sexual activity: Not Currently    Birth control/protection: Post-menopausal  Other Topics Concern  Not on file  Social History Narrative   Pt's husband has dementia   Social Drivers of Corporate investment banker Strain: Low Risk  (04/14/2023)   Overall Financial Resource Strain (CARDIA)    Difficulty of Paying Living Expenses: Not hard at all  Food Insecurity: No Food Insecurity (04/14/2023)   Hunger Vital Sign    Worried About Running Out of Food in the Last Year: Never true    Ran Out of Food in the Last Year: Never true  Transportation Needs: No Transportation Needs (04/14/2023)   PRAPARE - Administrator, Civil Service (Medical): No    Lack of Transportation (Non-Medical): No  Physical Activity: Sufficiently Active (04/14/2023)   Exercise Vital Sign    Days of Exercise per Week: 4 days    Minutes of Exercise per Session: 40 min  Stress: Stress Concern Present (04/14/2023)   [employer] of Occupational Health - Occupational Stress Questionnaire    Feeling of Stress : To some extent  Social Connections: Unknown (04/14/2023)   Social Connection and Isolation Panel [NHANES]    Frequency of Communication with Friends and Family:  More than three times a week    Frequency of Social Gatherings with Friends and Family: Twice a week    Attends Religious Services: Patient declined    Database administrator or Organizations: No    Attends Engineer, structural: Not on file    Marital Status: Widowed    Review of systems General: negative for malaise, night sweats, fever, chills, weight loss Neck: Negative for lumps, goiter, pain and significant neck swelling Resp: Negative for cough, wheezing, dyspnea at rest CV: Negative for chest pain, leg swelling, palpitations, orthopnea GI: denies melena, hematochezia, nausea, vomiting, constipation, diarrhea, dysphagia, odyonophagia, early satiety or unintentional weight loss. +gas +bloating The remainder of the review of systems is noncontributory.  Physical Exam: BP 137/81   Pulse 80   Temp 98 F (36.7 C) (Oral)   Ht 5\' 7"  (1.702 m)   Wt 150 lb (68 kg)   BMI 23.49 kg/m  General:   Alert and oriented. No distress noted. Pleasant and cooperative.  Head:  Normocephalic and atraumatic. Eyes:  Conjuctiva clear without scleral icterus. Mouth:  Oral mucosa pink and moist. Good dentition. No lesions. Heart: Normal rate and rhythm, s1 and s2 heart sounds present.  Lungs: Clear lung sounds in all lobes. Respirations equal and unlabored. Abdomen:  +BS, soft, non-tender and non-distended. No rebound or guarding. No HSM or masses noted. Neurologic:  Alert and  oriented x4 Psych:  Alert and cooperative. Normal mood and affect.  Invalid input(s): "6 MONTHS"   ASSESSMENT: RETIA CORDLE is a 63 y.o. female presenting today for follow up of IBS, flatulence and bloating  Ongoing gas and bloating. Denies diarrhea or constipation. Did not have improvement with trial of xifaxan. Recent SIBO/SIMO testing was negative.  EGD/colonoscopy as above without findings to explain symptoms. She is using FDgard, pepto, and gas x with some mild improvement. Has used bentyl 10mg  PRN as well.  At this time, source of her symptoms is not quite clear, could be secondary to IBS, though given she feels symptoms are really affecting her quality of life, will do 4 day trial of Sucraid to determine if CSID may be causing her GI symptoms.    PLAN:  -4 day sucraid trial  -continue to avoid gluten/other known triggers -can use FD gard/pepto/gas x PRN for now   All questions  were answered, patient verbalized understanding and is in agreement with plan as outlined above.    Follow Up: 3 months   Brittanie Dosanjh L. [name], MSN, APRN, AGNP-C Adult-Gerontology Nurse Practitioner [facility]  I have reviewed the note and agree with the APP's assessment as described in this progress note  If persistent bloating and early satiety, may need to consider performing a gastric emptying study.  Katrinka Blazing, MD Gastroenterology and Hepatology [facility]

## 2023-10-05 NOTE — Telephone Encounter (Signed)
 I called and left a message on patient vm asked that she call [facility] tomorrow at [phone] to set up shipment of sucraid four day trial.

## 2023-10-05 NOTE — Telephone Encounter (Signed)
 Per Leeroy Bock, we will start Sucraid four day trial. I have submitted the patient forms to [facility] for the sample and the patient will need to call them tomorrow.

## 2023-10-05 NOTE — Patient Instructions (Signed)
 Continue to avoid gluten and any known triggers You can use pepto/FDgard/gas x as needed We will trial 4 days of sucraid, let me know how you are doing on this after day 2  Follow up 3 months  It was a pleasure to see you today. I want to create trusting relationships with patients and provide genuine, compassionate, and quality care. I truly value your feedback! please be on the lookout for a survey regarding your visit with me today. I appreciate your input about our visit and your time in completing this!    Duha Abair L. [name], MSN, APRN, AGNP-C Adult-Gerontology Nurse Practitioner [facility]

## 2023-10-06 ENCOUNTER — Telehealth: Payer: Self-pay | Admitting: Family Medicine

## 2023-10-06 NOTE — Telephone Encounter (Signed)
 Refill on  rosuvastatin (CRESTOR) 20 MG tablet  send to [facility]

## 2023-10-07 ENCOUNTER — Other Ambulatory Visit: Payer: Self-pay

## 2023-10-07 MED ORDER — ROSUVASTATIN CALCIUM 20 MG PO TABS: 20.0000 mg | ORAL_TABLET | Freq: Every day | ORAL | 1 refills | Status: DC

## 2023-10-07 NOTE — Telephone Encounter (Signed)
 Fax from [facility] stating they have received the prescription for sucraid and are currently reviewing patient's eligibility to receive the four day trial. Can reach [facility] at phone number [phone] and fax [phone].

## 2023-11-03 ENCOUNTER — Other Ambulatory Visit (INDEPENDENT_AMBULATORY_CARE_PROVIDER_SITE_OTHER): Payer: Self-pay | Admitting: Gastroenterology

## 2023-11-03 ENCOUNTER — Other Ambulatory Visit (INDEPENDENT_AMBULATORY_CARE_PROVIDER_SITE_OTHER): Payer: Self-pay | Admitting: *Deleted

## 2023-11-03 ENCOUNTER — Telehealth (INDEPENDENT_AMBULATORY_CARE_PROVIDER_SITE_OTHER): Payer: Self-pay | Admitting: *Deleted

## 2023-11-03 MED ORDER — SUCRAID 8500 UNIT/ML PO SOLN
ORAL | 3 refills | Status: DC
Start: 2023-11-03 — End: 2023-12-10

## 2023-11-03 NOTE — Telephone Encounter (Signed)
 Called patient to see how the 4 day trial went for sucraid . She said she still had gas but it was not as severe and it want to try the medication to see if it would help if taking longer than 4 days.   [facility].

## 2023-11-04 NOTE — Telephone Encounter (Signed)
 Submitted through cover my meds and they were unable to process. It said to call the number on the member's card. Will need to call insurance to do PA.

## 2023-11-05 NOTE — Telephone Encounter (Signed)
 Fax from [facility] asking for additional medical records for approval They want the following:  Disaccharidase assay demonstrating deficient sucrase/isomaltase activity with normal activity of other disaccharidases in duodenal or jejunal biopsy specimens OR genetic test demonstrating sucrase-isomaltase mutation OR all of the following: sucrose intolerance hydrogen breath test showing increase in breath of hydrogen >10 ppm when challenged with sucrose after fasting, negative lactose breath test, and stool.

## 2023-11-05 NOTE — Telephone Encounter (Signed)
 Records faxed.

## 2023-11-09 NOTE — Telephone Encounter (Signed)
 Records refaxed per request of [facility]

## 2023-11-13 NOTE — Telephone Encounter (Signed)
 Called to check status of sucraid  rx and was told that they are still asking for records. I let her know I have sent all the records we have 3 different times. They are asking for disaccharidase assay demonstrating deficient sucrase/isomaltase activity with normal activity of other disacchardiases in duodenal or jejunal biosy specimens OR genetic test demonstrating sucrase isomaltase mutation OR all of the following:sucrose intolerance hydrogen breath test showing increaes in breath of hydrogen >10 ppm when challenged with sucrose after fasting, negative lactose breath test and stool ph <6.   I have sent in the trio smart breath test that was in her chart and the office visit notes. She said they would review and send letter of determination. Await response

## 2023-11-18 ENCOUNTER — Ambulatory Visit (HOSPITAL_COMMUNITY)
Admission: RE | Admit: 2023-11-18 | Discharge: 2023-11-18 | Disposition: A | Discharge status: Home / Self Care | Source: Ambulatory Visit | Attending: Oncology | Admitting: Oncology

## 2023-11-18 ENCOUNTER — Encounter (HOSPITAL_COMMUNITY): Payer: Self-pay

## 2023-11-18 DIAGNOSIS — C50412 Malignant neoplasm of upper-outer quadrant of left female breast: Secondary | ICD-10-CM | POA: Insufficient documentation

## 2023-11-18 DIAGNOSIS — Z170 Estrogen receptor positive status [ER+]: Secondary | ICD-10-CM | POA: Diagnosis present

## 2023-11-18 DIAGNOSIS — Z1231 Encounter for screening mammogram for malignant neoplasm of breast: Secondary | ICD-10-CM | POA: Insufficient documentation

## 2023-11-19 NOTE — Telephone Encounter (Signed)
 Fax from [facility] stating the request for sucraid  is denied. It says off label/experimental use is not covered by the patient's benefit plan. Denial letter on your desk for review.

## 2023-11-23 ENCOUNTER — Other Ambulatory Visit (INDEPENDENT_AMBULATORY_CARE_PROVIDER_SITE_OTHER): Payer: Self-pay | Admitting: Gastroenterology

## 2023-11-24 NOTE — Telephone Encounter (Signed)
 I called and left a message for Sharia Daunt (drug rep for Sucraid ) @ [phone]. I asked that he please return call to the office.

## 2023-11-25 NOTE — Telephone Encounter (Signed)
 I spoke with Monica Russell with Sucraid . He says we could try to do the Sucraid  testing, but patient may be told she will be charged 199.00 for this test, by the company [employer] whom runs the test. He says they have told him they would not charge patient, but to make the patient aware, as they may try to get her to pay this amount with out running through her insurance. He says his testing is a carbon 13 test, and not what the insurance is needing proof of.    He says we could try to do an appeal, stating the patient had a positive response with the four day trial, or reach out to [employer] and let them know the reasoning behind the denial with patient insurance [employer], to see if they can help,or write an appeal stating the Disaccharidase assay is not available to the patient in this area of the country.  But I read to [facility], what the denial states which is this medication is denied as it is considered off label as the patient has not done #1: Disaccharidase Assay demonstrating deficient sucrase/isomaltase activity of other Disaccharidases in the duodenal or jejunal biopsy specimen. (Per Sherolyn Dixon this is a biopsy that would be done while having a EGD), which he says he spoke with Dr. Sammi Crick in the past about this and this is not a biopsy that is currently done within the [facility] system.   # 2:The second is a genetic testing that Sherolyn Dixon says is not reliable.  #3: The third is having sucrose intolerance hydrogen breath test showing increase in the hydrogen greater that 10 ppm when challenges with a sucrose after fasting, and negative lactose breath test and stool PH less than 6. (Per Sherolyn Dixon this is three separate tests) one test giving the patient sucrose and then measuring the methane , then another test that they give the patient lactose and then test having a normal methane level, then a stool test that measures the PH of the stool to less than 6.   Please advise.

## 2023-11-26 ENCOUNTER — Encounter (HOSPITAL_COMMUNITY): Payer: Self-pay | Admitting: Hematology

## 2023-11-26 ENCOUNTER — Telehealth: Payer: Self-pay | Admitting: Pharmacy Technician

## 2023-11-26 ENCOUNTER — Encounter (INDEPENDENT_AMBULATORY_CARE_PROVIDER_SITE_OTHER): Payer: Self-pay | Admitting: Gastroenterology

## 2023-11-26 ENCOUNTER — Other Ambulatory Visit (HOSPITAL_COMMUNITY): Payer: Self-pay

## 2023-11-26 NOTE — Telephone Encounter (Signed)
 Pharmacy Patient Advocate Encounter   Received notification from CoverMyMeds that prior authorization for Budesonide -Formoterol  Fumarate 160-4.5MCG/ACT aerosol is required/requested.   Insurance verification completed.   The patient is insured through [employer] .   Per test claim: PA required; PA submitted to above mentioned insurance via CoverMyMeds Key/confirmation #/EOC [id number] Status is pending

## 2023-11-27 ENCOUNTER — Other Ambulatory Visit (HOSPITAL_COMMUNITY): Payer: Self-pay

## 2023-11-27 NOTE — Telephone Encounter (Signed)
 Pharmacy Patient Advocate Encounter  Received notification from [facility] that Prior Authorization for Budesonide -Formoterol  Fumarate 160-4.5MCG/ACT aerosol has been APPROVED from 10/27/2023 to 11/25/2024. Unable to obtain price due to refill too soon rejection, last fill date 11/26/2023 next available fill date06/12/2023.   PA #/Case ID/Reference #: [id number]

## 2023-11-30 NOTE — Telephone Encounter (Signed)
 I have called and left a message for Monica Russell with [facility] to see if it is possible he can do an appeal for this patient's sucraid . (As the script was sent through [employer]). Awaiting a return call back.

## 2023-11-30 NOTE — Telephone Encounter (Signed)
 I spoke with the patient and she says she does not want to go through with any more expensive testing. She says we can do the appeal and see where that leads us .

## 2023-11-30 NOTE — Telephone Encounter (Signed)
 I called and left the patient a message on her Vm, I asked that she please return call to the office.

## 2023-12-01 NOTE — Telephone Encounter (Signed)
 Monica Russell called back and says he could get an appeal letter started for us . I will fax him all the patient information to get this started.

## 2023-12-01 NOTE — Telephone Encounter (Signed)
    From: [email or web address]_[email or web address]-[email or web address]> Sent: Tuesday, Dec 01, 2023 11:14 AM To: Estudillo, Mindy [email or web address]> Subject: Fax: Tx 'ok' Report     This message was sent via FAXCOM, a product from [employer]. http://www.biscom.com/                    -------Fax Transmission Report-------  To:               Recipient at [phone] Subject:          V. Vallarie Gauze [birth year] Karrin Pae S Result:           The transmission was successful. Explanation:      All Pages Ok Pages Sent:       18 Connect Time:     14 minutes, 42 seconds Transmit Time:    12/01/2023 10:59 Transfer Rate:    14400 Status Code:      0000 Retry Count:      0 Job Id:           [id number] Unique Id:        [id number] Fax Line:         21 Fax Server:       [id number]  I have faxed the patient information to [employer] at [place] and he will start an appeal letter for us  to help the patient get her Sucraid .

## 2023-12-02 NOTE — Telephone Encounter (Signed)
 Appeal letter written, please look over and let me know if any changes need to be made. Appeal letter on your desk.

## 2023-12-03 NOTE — Telephone Encounter (Signed)
 Faxed the denial and the Appeal letter to [facility].   This message was sent via FAXCOM, a product from [employer]. http://www.biscom.com/                    -------Fax Transmission Report-------  To:               Recipient at [phone] Subject:          Katheen Palma Result:           The transmission was successful. Explanation:      All Pages Ok Pages Sent:       52 Connect Time:     15 minutes, 23 seconds Transmit Time:    12/03/2023 07:45 Transfer Rate:    14400 Status Code:      0000 Retry Count:      0 Job Id:           [id number] Unique Id:        [id number] Fax Line:         30 Fax Server:       [employer]

## 2023-12-08 NOTE — Telephone Encounter (Signed)
 Appeal denied per [facility], due to being used as off label/experimental use, and not covered by patient's benefit plan. I will have Ann scan this denial of the Appeal to the patient chart.

## 2023-12-08 NOTE — Telephone Encounter (Signed)
 I spoke with the patient and she is not willing to proceed with any further testing as she says she would incur medical bills for something she really does not know works. (As only tried for a four day period). Patient says at this time she declines any further investigation of her symptoms and will "just work through her symptoms".

## 2023-12-10 ENCOUNTER — Ambulatory Visit: Payer: Self-pay | Admitting: Family Medicine

## 2023-12-10 ENCOUNTER — Ambulatory Visit (HOSPITAL_COMMUNITY)
Admission: RE | Admit: 2023-12-10 | Discharge: 2023-12-10 | Disposition: A | Discharge status: Home / Self Care | Source: Ambulatory Visit | Attending: Family Medicine | Admitting: Family Medicine

## 2023-12-10 ENCOUNTER — Ambulatory Visit: Admitting: Family Medicine

## 2023-12-10 VITALS — BP 132/78 | HR 82 | Temp 98.8°F | Ht 67.0 in | Wt 146.4 lb

## 2023-12-10 DIAGNOSIS — Z720 Tobacco use: Secondary | ICD-10-CM | POA: Diagnosis not present

## 2023-12-10 DIAGNOSIS — R053 Chronic cough: Secondary | ICD-10-CM | POA: Insufficient documentation

## 2023-12-10 DIAGNOSIS — E782 Mixed hyperlipidemia: Secondary | ICD-10-CM

## 2023-12-10 NOTE — Progress Notes (Signed)
   Subjective:    Patient ID: Monica Russell, female    DOB: [birth year], 63 y.o.   MRN: [id number]  HPI Pt comes in with complaints of lingering cough and fatigue that has been persistent over the past 3-4 weeks. Medications and allergies reviewed. Patient does smoke half pack a day She has been counseled to quit She is doing with osteoporosis Getting over the loss of her husband Taking her medicines as directed Trying to eat healthy Relates recent bronchitis with coughing congestion and she was treated with a short course prednisone  she felt that helped some Has persistent coughing Denies any other setbacks Denies any hematuria or hemoptysis   Review of Systems     Objective:   Physical Exam General-in no acute distress Eyes-no discharge Lungs-respiratory rate normal, CTA CV-no murmurs,RRR Extremities skin warm dry no edema Neuro grossly normal Behavior normal, alert        Assessment & Plan:   1. Mixed hyperlipidemia (Primary) Continue medication healthy diet regular activity  2. Persistent cough Chest x-ray Recommend consideration for lung cancer screening patient defers currently She will think about it Await the results of the x-ray  - DG Chest 2 View  3. Tobacco use Counseled on quitting Patient will do her best - DG Chest 2 View

## 2023-12-11 ENCOUNTER — Other Ambulatory Visit: Payer: Self-pay | Admitting: Family Medicine

## 2023-12-11 MED ORDER — DOXYCYCLINE HYCLATE 100 MG PO TABS: 100.0000 mg | ORAL_TABLET | Freq: Two times a day (BID) | ORAL | 0 refills | Status: DC

## 2023-12-11 MED ORDER — TRELEGY ELLIPTA 100-62.5-25 MCG/ACT IN AEPB: 1.0000 | INHALATION_SPRAY | Freq: Every day | RESPIRATORY_TRACT | 11 refills | Status: AC

## 2023-12-11 MED ORDER — PREDNISONE 20 MG PO TABS: ORAL_TABLET | ORAL | 0 refills | Status: DC

## 2023-12-15 ENCOUNTER — Other Ambulatory Visit: Payer: Self-pay

## 2023-12-15 DIAGNOSIS — R0602 Shortness of breath: Secondary | ICD-10-CM

## 2023-12-15 DIAGNOSIS — J439 Emphysema, unspecified: Secondary | ICD-10-CM

## 2023-12-15 NOTE — Progress Notes (Signed)
 pftpp

## 2023-12-17 ENCOUNTER — Telehealth (INDEPENDENT_AMBULATORY_CARE_PROVIDER_SITE_OTHER): Payer: Self-pay

## 2023-12-17 NOTE — Telephone Encounter (Addendum)
 Patient reached out to me this morning and says she has decided not to follow through with this testing as she just can not stand the thoughts of having to drink the Sucraid  all day everyday. She says she is hoping something new will surface that will help with her issues she is having. She says to tell Joie Narrow thank you , but she will keep doing, what she is currently doing in the mean time.    I spoke with the patient regarding doing the [employer] Sucrose breath test. I made the patient aware that we found out this testing is still free for us , and asked if she was interested in preforming the test. She says she is unsure at this time that she wants to go through with the testing as it seem too much of a hassle to take every time you eat anything, and she was worried about the cost of the medication if she did get approval from her insurance to take it. She says she will think about it and let me know if she changes her mind on whether she wants to proceed with the testing.

## 2024-01-04 ENCOUNTER — Other Ambulatory Visit: Payer: Self-pay | Admitting: Hematology

## 2024-02-04 ENCOUNTER — Telehealth (INDEPENDENT_AMBULATORY_CARE_PROVIDER_SITE_OTHER): Payer: Self-pay | Admitting: Gastroenterology

## 2024-02-04 NOTE — Telephone Encounter (Signed)
 Voicemail from [employer] received inquiring if we received the fax on 01/27/24 in regards to the denial of Sucraid . Under [employer], the fax is scanned with a note on bottom of fax stating  Pt has elected to not pursue taking this medication.   Returned call to [employer] and spoke with [place]. Informed pt that pt does not want to pursue taking Sucraid . Brandon placed me on hold and when he came back on the line, he stated he spoke with his team and updated them. They will update the case. If pt or office would like to restart this medication, we can give them a call depending on how long it has been.

## 2024-02-08 ENCOUNTER — Ambulatory Visit: Admitting: Family Medicine

## 2024-02-08 VITALS — BP 116/69 | HR 83 | Temp 98.3°F | Ht 67.0 in | Wt 148.0 lb

## 2024-02-08 DIAGNOSIS — J441 Chronic obstructive pulmonary disease with (acute) exacerbation: Secondary | ICD-10-CM

## 2024-02-08 MED ORDER — PREDNISONE 20 MG PO TABS: ORAL_TABLET | ORAL | 0 refills | Status: DC

## 2024-02-08 MED ORDER — DOXYCYCLINE HYCLATE 100 MG PO TABS: 100.0000 mg | ORAL_TABLET | Freq: Two times a day (BID) | ORAL | 0 refills | Status: DC

## 2024-02-08 NOTE — Progress Notes (Signed)
   Subjective:    Patient ID: Monica Russell, female    DOB: [birth year], 63 y.o.   MRN: [id number]  HPI cough, SOB fatigue and chills  Relates some intermittent chills over the past few days no high fevers relates cough congestion denies wheezing but does get short of breath especially in hot humid weather has had increased cough with congestion the past several days does not feel she has a flu or COVID   Review of Systems     Objective:   Physical Exam  Lungs clear heart regular HEENT benign      Assessment & Plan:  COPD exacerbation Smoker Lung cancer prevention  Antibiotics prednisone  prescribed warning signs discussed follow-up if progressive troubles Strategies to quit smoking discussed She will try nicotine replacement Move forward with low-dose lung cancer prevention screening She has an appointment with pulmonary in September for lung function test and office visit I did recommend for patient to consider doing a home COVID test if positive to let us  know Follow-up if any progressive troubles or worse She will benefit from seeing pulmonary as scheduled

## 2024-02-08 NOTE — Progress Notes (Signed)
 SABRA

## 2024-02-09 ENCOUNTER — Other Ambulatory Visit: Payer: Self-pay

## 2024-02-09 DIAGNOSIS — Z720 Tobacco use: Secondary | ICD-10-CM

## 2024-02-10 ENCOUNTER — Other Ambulatory Visit: Payer: Self-pay

## 2024-02-10 DIAGNOSIS — Z720 Tobacco use: Secondary | ICD-10-CM

## 2024-03-16 ENCOUNTER — Other Ambulatory Visit: Payer: Self-pay | Admitting: *Deleted

## 2024-03-16 MED ORDER — TAMOXIFEN CITRATE 20 MG PO TABS
20.0000 mg | ORAL_TABLET | Freq: Every day | ORAL | 2 refills | Status: DC
Start: 2024-03-16 — End: 2024-04-01

## 2024-03-20 ENCOUNTER — Telehealth: Payer: Self-pay | Admitting: Family Medicine

## 2024-03-20 NOTE — Telephone Encounter (Signed)
 Nurses Please see letter from [facility] regarding her respiratory medicines It states Trelegy and budesonide  are both being prescribed to the patient  According to my records she should be on Trelegy and not on the other please verify this with the patient  If that is the case then we can discontinue the budesonide  on that particular letter-and fax it back to them  So please find out lets move forward accordingly on this letter then I can sign it and we can send it thanks

## 2024-03-21 NOTE — Telephone Encounter (Signed)
 The form was sent to the front/nurses area with a sticky note please locate form sent back to me

## 2024-03-21 NOTE — Telephone Encounter (Signed)
 Patient says she is using Trelegy and albuterol  as needed.  She states she has not used budesonide  in a long time and it didn't work.

## 2024-03-22 ENCOUNTER — Other Ambulatory Visit: Payer: Self-pay | Admitting: Family Medicine

## 2024-03-22 NOTE — Telephone Encounter (Signed)
 No worries-if they send the form again we we will know to 5 the insurance company that she is only on Trelegy-for now no action necessary

## 2024-03-22 NOTE — Telephone Encounter (Signed)
Form found, signed and faxed

## 2024-03-22 NOTE — Telephone Encounter (Signed)
 Unable to locate form

## 2024-03-29 ENCOUNTER — Ambulatory Visit (INDEPENDENT_AMBULATORY_CARE_PROVIDER_SITE_OTHER): Admitting: *Deleted

## 2024-03-29 DIAGNOSIS — R300 Dysuria: Secondary | ICD-10-CM

## 2024-03-29 DIAGNOSIS — R350 Frequency of micturition: Secondary | ICD-10-CM

## 2024-03-29 LAB — POCT URINALYSIS DIPSTICK OB
Blood, UA: NEGATIVE
Glucose, UA: NEGATIVE
Ketones, UA: NEGATIVE
Leukocytes, UA: NEGATIVE
Nitrite, UA: NEGATIVE
POC,PROTEIN,UA: NEGATIVE

## 2024-03-29 NOTE — Progress Notes (Signed)
   NURSE VISIT- UTI SYMPTOMS   SUBJECTIVE:  Monica Russell is a 63 y.o. G1P1 female here for UTI symptoms. She is a GYN patient. She reports chills, dysuria, and urinary frequency.  OBJECTIVE:  There were no vitals taken for this visit.  Appears well, in no apparent distress  Results for orders placed or performed in visit on 03/29/24 (from the past 24 hours)  POC Urinalysis Dipstick OB   Collection Time: 03/29/24 12:09 PM  Result Value Ref Range   Color, UA     Clarity, UA     Glucose, UA Negative Negative   Bilirubin, UA     Ketones, UA neg    Spec Grav, UA     Blood, UA neg    pH, UA     POC,PROTEIN,UA Negative Negative, Trace, Small (1+), Moderate (2+), Large (3+), 4+   Urobilinogen, UA     Nitrite, UA neg    Leukocytes, UA Negative Negative   Appearance     Odor      ASSESSMENT: GYN patient with UTI symptoms and negative nitrites  PLAN: Note routed to Delon Lewis, AGNP   Rx sent by provider today: no Urine culture sent Call or return to clinic prn if these symptoms worsen or fail to improve as anticipated. Follow-up: as needed   Alan LITTIE Fischer  03/29/2024 12:09 PM

## 2024-03-30 ENCOUNTER — Inpatient Hospital Stay: Attending: Oncology

## 2024-03-30 DIAGNOSIS — Z7983 Long term (current) use of bisphosphonates: Secondary | ICD-10-CM | POA: Diagnosis not present

## 2024-03-30 DIAGNOSIS — Z79810 Long term (current) use of selective estrogen receptor modulators (SERMs): Secondary | ICD-10-CM | POA: Diagnosis not present

## 2024-03-30 DIAGNOSIS — Z170 Estrogen receptor positive status [ER+]: Secondary | ICD-10-CM | POA: Insufficient documentation

## 2024-03-30 DIAGNOSIS — E559 Vitamin D deficiency, unspecified: Secondary | ICD-10-CM | POA: Insufficient documentation

## 2024-03-30 DIAGNOSIS — M810 Age-related osteoporosis without current pathological fracture: Secondary | ICD-10-CM | POA: Insufficient documentation

## 2024-03-30 DIAGNOSIS — C50412 Malignant neoplasm of upper-outer quadrant of left female breast: Secondary | ICD-10-CM | POA: Diagnosis present

## 2024-03-30 LAB — CBC WITH DIFFERENTIAL/PLATELET
Abs Immature Granulocytes: 0.02 K/uL (ref 0.00–0.07)
Basophils Absolute: 0.1 K/uL (ref 0.0–0.1)
Basophils Relative: 1 %
Eosinophils Absolute: 0.2 K/uL (ref 0.0–0.5)
Eosinophils Relative: 2 %
HCT: 38.5 % (ref 36.0–46.0)
Hemoglobin: 12.4 g/dL (ref 12.0–15.0)
Immature Granulocytes: 0 %
Lymphocytes Relative: 26 %
Lymphs Abs: 2.7 K/uL (ref 0.7–4.0)
MCH: 29.4 pg (ref 26.0–34.0)
MCHC: 32.2 g/dL (ref 30.0–36.0)
MCV: 91.2 fL (ref 80.0–100.0)
Monocytes Absolute: 1 K/uL (ref 0.1–1.0)
Monocytes Relative: 10 %
Neutro Abs: 6.3 K/uL (ref 1.7–7.7)
Neutrophils Relative %: 61 %
Platelets: 338 K/uL (ref 150–400)
RBC: 4.22 MIL/uL (ref 3.87–5.11)
RDW: 12.6 % (ref 11.5–15.5)
WBC: 10.2 K/uL (ref 4.0–10.5)
nRBC: 0 % (ref 0.0–0.2)

## 2024-03-30 LAB — COMPREHENSIVE METABOLIC PANEL WITH GFR
ALT: 13 U/L (ref 0–44)
AST: 17 U/L (ref 15–41)
Albumin: 3.9 g/dL (ref 3.5–5.0)
Alkaline Phosphatase: 45 U/L (ref 38–126)
Anion gap: 12 (ref 5–15)
BUN: 6 mg/dL — ABNORMAL LOW (ref 8–23)
CO2: 25 mmol/L (ref 22–32)
Calcium: 9.2 mg/dL (ref 8.9–10.3)
Chloride: 101 mmol/L (ref 98–111)
Creatinine, Ser: 0.65 mg/dL (ref 0.44–1.00)
GFR, Estimated: >60 mL/min (ref >60)
Glucose, Bld: 106 mg/dL — ABNORMAL HIGH (ref 70–99)
Potassium: 4 mmol/L (ref 3.5–5.1)
Sodium: 138 mmol/L (ref 135–145)
Total Bilirubin: 0.5 mg/dL (ref 0.0–1.2)
Total Protein: 7.1 g/dL (ref 6.5–8.1)

## 2024-03-30 LAB — URINALYSIS
Bilirubin, UA: NEGATIVE
Glucose, UA: NEGATIVE
Ketones, UA: NEGATIVE
Nitrite, UA: NEGATIVE
Protein,UA: NEGATIVE
RBC, UA: NEGATIVE
Specific Gravity, UA: 1.005 (ref 1.005–1.030)
Urobilinogen, Ur: 0.2 mg/dL (ref 0.2–1.0)
pH, UA: 7.5 (ref 5.0–7.5)

## 2024-03-30 LAB — VITAMIN D 25 HYDROXY (VIT D DEFICIENCY, FRACTURES): Vit D, 25-Hydroxy: 30.51 ng/mL (ref 30–100)

## 2024-04-01 ENCOUNTER — Ambulatory Visit: Payer: Self-pay | Admitting: Adult Health

## 2024-04-01 ENCOUNTER — Inpatient Hospital Stay: Admitting: Oncology

## 2024-04-01 VITALS — BP 126/71 | HR 79 | Temp 98.1°F | Resp 16 | Wt 149.7 lb

## 2024-04-01 DIAGNOSIS — M810 Age-related osteoporosis without current pathological fracture: Secondary | ICD-10-CM

## 2024-04-01 DIAGNOSIS — C50412 Malignant neoplasm of upper-outer quadrant of left female breast: Secondary | ICD-10-CM

## 2024-04-01 DIAGNOSIS — Z170 Estrogen receptor positive status [ER+]: Secondary | ICD-10-CM

## 2024-04-01 DIAGNOSIS — E559 Vitamin D deficiency, unspecified: Secondary | ICD-10-CM | POA: Diagnosis not present

## 2024-04-01 LAB — URINE CULTURE

## 2024-04-01 MED ORDER — SULFAMETHOXAZOLE-TRIMETHOPRIM 800-160 MG PO TABS: 1.0000 | ORAL_TABLET | Freq: Two times a day (BID) | ORAL | 0 refills | Status: DC

## 2024-04-01 MED ORDER — TAMOXIFEN CITRATE 20 MG PO TABS: 20.0000 mg | ORAL_TABLET | Freq: Every day | ORAL | 2 refills | Status: AC

## 2024-04-01 NOTE — Assessment & Plan Note (Addendum)
-  Vitamin D  level is 30.51 (6.29). -She has been low for many years due to intolerance to vitamin D  tablets.  -She is currently taking Fosamax  for bone strengthening for her osteoporosis. - She was able to find a vitamin D  gummy 2000 international units that she is taking nightly without any significant side effects. Continue vitamin D  supplements.

## 2024-04-01 NOTE — Assessment & Plan Note (Addendum)
-   Labs from 03/30/2024 show normal CBC with differential.  CMP with normal LFTs. -She is currently stable on tamoxifen .  She will complete 5 years of tamoxifen  in May 2026.  We discussed BCI testing prior to see if she would benefit from extended hormone therapy.  Will ask Joesph to send this off for me. -Most recent mammogram is from 11/18/2023 which was read as BI-RADS Category 1 negative. -Repeat in May 2026.

## 2024-04-01 NOTE — Assessment & Plan Note (Addendum)
-  Most current bone density scan from 02/27/2023 showed a T-score of -3.3. -Repeat bone density in August 2026. -She is currently on Fosamax  weekly repeat and in 2 to 3 years.

## 2024-04-01 NOTE — Progress Notes (Signed)
 [facility] OFFICE PROGRESS NOTE  Alphonsa Glendia LABOR, MD  ASSESSMENT & PLAN:    Assessment & Plan Malignant neoplasm of upper-outer quadrant of left breast in female, estrogen receptor positive (HCC) - Labs from 03/30/2024 show normal CBC with differential.  CMP with normal LFTs. -She is currently stable on tamoxifen .  She will complete 5 years of tamoxifen  in May 2026.  We discussed BCI testing prior to see if she would benefit from extended hormone therapy.  Will ask Joesph to send this off for me. -Most recent mammogram is from 11/18/2023 which was read as BI-RADS Category 1 negative. -Repeat in May 2026. Vitamin D  deficiency -Vitamin D  level is 30.51 (6.29). -She has been low for many years due to intolerance to vitamin D  tablets.  -She is currently taking Fosamax  for bone strengthening for her osteoporosis. - She was able to find a vitamin D  gummy 2000 international units that she is taking nightly without any significant side effects. Continue vitamin D  supplements. Osteoporosis without current pathological fracture, unspecified osteoporosis type -Most current bone density scan from 02/27/2023 showed a T-score of -3.3. -Repeat bone density in August 2026. -She is currently on Fosamax  weekly repeat and in 2 to 3 years.  Orders Placed This Encounter  Procedures   MM 3D SCREENING MAMMOGRAM BILATERAL BREAST    Standing Status:   Future    Expected Date:   09/29/2024    Expiration Date:   12/28/2024    Reason for Exam (SYMPTOM  OR DIAGNOSIS REQUIRED):   ANNUAL    Preferred imaging location?:   [facility]   CBC with Differential    Standing Status:   Future    Expected Date:   09/29/2024    Expiration Date:   12/28/2024   Comprehensive metabolic panel    Standing Status:   Future    Expected Date:   09/29/2024    Expiration Date:   12/28/2024   Vitamin D  25 hydroxy    Standing Status:   Future    Expected Date:   09/29/2024    Expiration Date:   12/28/2024     INTERVAL HISTORY:  Monica Russell is a 63 year old female with past medical history significant for left-sided breast cancer.    She is currently on tamoxifen  and tolerating well.  Has occasional hot flashes.   Patient had bone density scan on 02/27/2023 which showed a T-score of -3.3.  She is currently on Fosamax  1 tablet weekly.  She was finally able to find a vitamin D  gummy that she could tolerate without any facial swelling.  Reports she is taking 2000 international units each night with great tolerance.  She had a mammogram on 11/18/2023 which was read as BI-RADS Category 1 negative.  Repeat annually.   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  She denies any new lumps or bumps.  She spends most of her day outside and does not know why her vitamin D  levels are low.  Reports most of the women in her family have been deficient in vitamin D  as well.  We reviewed CBC, CMP, vitamin D .  SUMMARY OF HEMATOLOGIC HISTORY: Oncology History  Mass of upper outer quadrant of left breast (Resolved)  08/17/2019 Initial Diagnosis   Mass of upper outer quadrant of left breast   11/11/2019 Genetic Testing        Malignant neoplasm of upper-outer quadrant of left female breast (HCC)  09/15/2019 Initial Diagnosis  Malignant neoplasm of upper-outer quadrant of left female breast (HCC)   09/15/2019 Cancer Staging   Staging form: Breast, AJCC 8th Edition - Clinical stage from 09/15/2019: Stage IIA (cT2, cN1(f), cM0, G2, ER+, PR+, HER2-) - Signed by Rogers Hai, MD on 11/28/2019      CBC    Component Value Date/Time   WBC 10.2 03/30/2024 1053   RBC 4.22 03/30/2024 1053   HGB 12.4 03/30/2024 1053   HGB 11.9 12/17/2021 0922   HCT 38.5 03/30/2024 1053   HCT 36.2 12/17/2021 0922   PLT 338 03/30/2024 1053   PLT 262 12/17/2021 0922   MCV 91.2 03/30/2024 1053   MCV 87 12/17/2021 0922   MCH 29.4 03/30/2024 1053   MCHC 32.2 03/30/2024 1053   RDW 12.6 03/30/2024  1053   RDW 12.1 12/17/2021 0922   LYMPHSABS 2.7 03/30/2024 1053   LYMPHSABS 1.7 12/17/2021 0922   MONOABS 1.0 03/30/2024 1053   EOSABS 0.2 03/30/2024 1053   EOSABS 0.2 12/17/2021 0922   BASOSABS 0.1 03/30/2024 1053   BASOSABS 0.1 12/17/2021 0922       Latest Ref Rng & Units 03/30/2024   10:53 AM 09/24/2023    8:52 AM 03/27/2023   11:15 AM  CMP  Glucose 70 - 99 mg/dL 893  883  885   BUN 8 - 23 mg/dL 6  <5  5   Creatinine 9.55 - 1.00 mg/dL 9.34  9.42  9.35   Sodium 135 - 145 mmol/L 138  135  132   Potassium 3.5 - 5.1 mmol/L 4.0  4.0  3.9   Chloride 98 - 111 mmol/L 101  100  98   CO2 22 - 32 mmol/L 25  24  24    Calcium  8.9 - 10.3 mg/dL 9.2  9.0  8.6   Total Protein 6.5 - 8.1 g/dL 7.1  6.9  6.8   Total Bilirubin 0.0 - 1.2 mg/dL 0.5  0.3  0.5   Alkaline Phos 38 - 126 U/L 45  34  37   AST 15 - 41 U/L 17  15  14    ALT 0 - 44 U/L 13  11  11       Lab Results  Component Value Date   FERRITIN 68 11/11/2021    Vitals:   04/01/24 0953  BP: 126/71  Pulse: 79  Resp: 16  Temp: 98.1 F (36.7 C)  SpO2: 100%    Review of System:  Review of Systems  Constitutional:  Negative for malaise/fatigue.  Genitourinary:  Positive for dysuria.    Physical Exam: Physical Exam Constitutional:      Appearance: Normal appearance.  HENT:     Head: Normocephalic and atraumatic.  Eyes:     Pupils: Pupils are equal, round, and reactive to light.  Cardiovascular:     Rate and Rhythm: Normal rate and regular rhythm.     Heart sounds: Normal heart sounds. No murmur heard. Pulmonary:     Effort: Pulmonary effort is normal.     Breath sounds: Normal breath sounds. No wheezing.  Abdominal:     General: Bowel sounds are normal. There is no distension.     Palpations: Abdomen is soft.     Tenderness: There is no abdominal tenderness.  Musculoskeletal:        General: Normal range of motion.     Cervical back: Normal range of motion.  Skin:    General: Skin is warm and dry.     Findings:  No rash.  Neurological:     Mental Status: She is alert and oriented to person, place, and time.     Gait: Gait is intact.  Psychiatric:        Mood and Affect: Mood and affect normal.        Cognition and Memory: Memory normal.        Judgment: Judgment normal.      I spent 25 minutes dedicated to the care of this patient (face-to-face and non-face-to-face) on the date of the encounter to include what is described in the assessment and plan.,  Delon Hope, NP 04/01/2024 10:19 AM

## 2024-04-07 ENCOUNTER — Ambulatory Visit (HOSPITAL_BASED_OUTPATIENT_CLINIC_OR_DEPARTMENT_OTHER): Admitting: Pulmonary Disease

## 2024-04-20 ENCOUNTER — Telehealth: Payer: Self-pay | Admitting: Family Medicine

## 2024-04-20 NOTE — Telephone Encounter (Signed)
 Prescription Request  04/20/2024  LOV: 02/08/2024  What is the name of the medication or equipment? pantoprazole  (PROTONIX ) 40 MG tablet   Have you contacted your pharmacy to request a refill? Yes   Which pharmacy would you like this sent to?  [id number] - [place], [place] - [address] [place] [address] Phone: [phone] Fax: [phone]    Patient notified that their request is being sent to the clinical staff for review and that they should receive a response within 2 business days.   Please advise at [facility] [phone]

## 2024-04-21 ENCOUNTER — Other Ambulatory Visit: Payer: Self-pay | Admitting: Nurse Practitioner

## 2024-04-21 MED ORDER — PANTOPRAZOLE SODIUM 40 MG PO TBEC: DELAYED_RELEASE_TABLET | ORAL | 1 refills | Status: AC

## 2024-04-27 ENCOUNTER — Encounter (INDEPENDENT_AMBULATORY_CARE_PROVIDER_SITE_OTHER): Payer: Self-pay | Admitting: Gastroenterology

## 2024-05-18 ENCOUNTER — Encounter: Payer: Self-pay | Admitting: Family Medicine

## 2024-05-18 DIAGNOSIS — E782 Mixed hyperlipidemia: Secondary | ICD-10-CM

## 2024-05-18 MED ORDER — ROSUVASTATIN CALCIUM 20 MG PO TABS: 20.0000 mg | ORAL_TABLET | Freq: Every day | ORAL | 0 refills | Status: DC

## 2024-05-20 ENCOUNTER — Other Ambulatory Visit: Payer: Self-pay | Admitting: Nurse Practitioner

## 2024-05-20 DIAGNOSIS — E782 Mixed hyperlipidemia: Secondary | ICD-10-CM

## 2024-05-24 ENCOUNTER — Other Ambulatory Visit: Payer: Self-pay | Admitting: Family Medicine

## 2024-05-24 ENCOUNTER — Encounter: Payer: Self-pay | Admitting: Family Medicine

## 2024-05-24 DIAGNOSIS — E782 Mixed hyperlipidemia: Secondary | ICD-10-CM

## 2024-05-24 DIAGNOSIS — R739 Hyperglycemia, unspecified: Secondary | ICD-10-CM

## 2024-05-24 MED ORDER — ROSUVASTATIN CALCIUM 20 MG PO TABS: 20.0000 mg | ORAL_TABLET | Freq: Every day | ORAL | 2 refills | Status: DC

## 2024-05-26 ENCOUNTER — Encounter (HOSPITAL_COMMUNITY): Payer: Self-pay | Admitting: Oncology

## 2024-06-02 ENCOUNTER — Ambulatory Visit (HOSPITAL_BASED_OUTPATIENT_CLINIC_OR_DEPARTMENT_OTHER): Admitting: Pulmonary Disease

## 2024-06-09 LAB — LIPID PANEL
Chol/HDL Ratio: 2.7 ratio (ref 0.0–4.4)
Cholesterol, Total: 188 mg/dL (ref 100–199)
HDL: 70 mg/dL (ref >39)
LDL Chol Calc (NIH): 101 mg/dL — ABNORMAL HIGH (ref 0–99)
Triglycerides: 96 mg/dL (ref 0–149)
VLDL Cholesterol Cal: 17 mg/dL (ref 5–40)

## 2024-06-09 LAB — HEMOGLOBIN A1C
Est. average glucose Bld gHb Est-mCnc: 134 mg/dL
Hgb A1c MFr Bld: 6.3 % — ABNORMAL HIGH (ref 4.8–5.6)

## 2024-06-10 ENCOUNTER — Encounter (HOSPITAL_COMMUNITY): Payer: Self-pay | Admitting: Oncology

## 2024-06-10 ENCOUNTER — Ambulatory Visit: Payer: Self-pay | Admitting: Family Medicine

## 2024-06-14 ENCOUNTER — Ambulatory Visit: Admitting: Family Medicine

## 2024-06-14 ENCOUNTER — Encounter: Payer: Self-pay | Admitting: Family Medicine

## 2024-06-14 VITALS — BP 139/81 | HR 80 | Ht 67.0 in | Wt 145.0 lb

## 2024-06-14 DIAGNOSIS — Z23 Encounter for immunization: Secondary | ICD-10-CM

## 2024-06-14 DIAGNOSIS — J439 Emphysema, unspecified: Secondary | ICD-10-CM

## 2024-06-14 DIAGNOSIS — E782 Mixed hyperlipidemia: Secondary | ICD-10-CM

## 2024-06-14 DIAGNOSIS — R7303 Prediabetes: Secondary | ICD-10-CM

## 2024-06-14 MED ORDER — ROSUVASTATIN CALCIUM 40 MG PO TABS: 40.0000 mg | ORAL_TABLET | Freq: Every day | ORAL | 2 refills | Status: AC

## 2024-06-14 NOTE — Progress Notes (Signed)
   Subjective:    Patient ID: Monica Russell, female    DOB: 02-Oct-1960, 63 y.o.   MRN: [id number]  HPI Discussed the use of AI scribe software for clinical note transcription with the patient, who gave verbal consent to proceed.  History of Present Illness   Monica Russell is a 63 year old female with COPD who presents for a follow-up visit.  Her COPD is well-managed with daily use of Trelegy, and she seldom requires her rescue inhaler, primarily during seasonal changes or weather fluctuations. She recently experienced a sinus infection that progressed to her chest over three to four days. She uses Trelegy daily and occasionally uses a rescue inhaler.  She has a history of elevated cholesterol, with her HDL at 70 and LDL slightly elevated. She is currently on a cholesterol medication at 20 mg daily. Her A1c has increased to 6.3, and she wants to adjust her diet, particularly reducing bread and potatoes.  Her current medications include Trelegy daily, Fosamax  weekly, and a cholesterol medication at 20 mg daily. She occasionally uses an acid blocker for her stomach, depending on her diet. She drinks coffee, unsweetened tea, and water  regularly, and is mindful of her snack choices, opting for nuts over chips.  She has not received the shingles vaccine and has experienced a severe case of shingles in the past.      We did discuss vaccines today including RSV and pneumococcal she will do the RSV through the pharmacy she will do the pneumococcal today   Review of Systems     Objective:   Physical Exam General-in no acute distress Eyes-no discharge Lungs-respiratory rate normal, CTA CV-no murmurs,RRR Extremities skin warm dry no edema Neuro grossly normal Behavior normal, alert        Assessment & Plan:   1. Chronic obstructive pulmonary disease with emphysema, unspecified emphysema type (HCC) Continue Trelegy Use albuterol  as needed for rescue Try to avoid smoking Flu shot  today Pneumococcal today RSV recommended   2. Immunization due (Primary) Today - Flu vaccine HIGH DOSE PF(Fluzone Trivalent) - Pneumococcal conjugate vaccine 20-valent (Prevnar 20)  3. Mixed hyperlipidemia Bump up dose from 20 mg to 40 mg goal is to get LDL below 70 if possible check labs before next visit in 6 months if side effects from medication let us  know and we will revert back to 20 mg  - rosuvastatin  (CRESTOR ) 40 MG tablet; Take 1 tablet (40 mg total) by mouth daily.  Dispense: 90 tablet; Refill: 2  4. Prediabetes A1c overall showing elevation not in a diabetic range healthy diet minimize starches recommended

## 2024-07-11 ENCOUNTER — Encounter: Payer: Self-pay | Admitting: *Deleted

## 2024-08-09 ENCOUNTER — Telehealth: Admitting: Family Medicine

## 2024-08-09 ENCOUNTER — Encounter: Payer: Self-pay | Admitting: Family Medicine

## 2024-08-09 DIAGNOSIS — J441 Chronic obstructive pulmonary disease with (acute) exacerbation: Secondary | ICD-10-CM

## 2024-08-09 DIAGNOSIS — J439 Emphysema, unspecified: Secondary | ICD-10-CM | POA: Insufficient documentation

## 2024-08-09 MED ORDER — PREDNISONE 20 MG PO TABS: ORAL_TABLET | ORAL | 0 refills | Status: AC

## 2024-08-09 MED ORDER — DOXYCYCLINE HYCLATE 100 MG PO TABS: 100.0000 mg | ORAL_TABLET | Freq: Two times a day (BID) | ORAL | 0 refills | Status: AC

## 2024-08-09 NOTE — Progress Notes (Signed)
" ° °  Subjective:    Patient ID: Monica Russell, female    DOB: 05/22/1961, 64 y.o.   MRN: [id number]  HPI Patient is online for the video visit.  Patient is having right lung inflammation with a cough  States she feels like this every time the season changes.  Virtual Visit via Video Note  I connected with Vadis M Pressly on 08/09/24 at  1:10 PM EST by a video enabled telemedicine application and verified that I am speaking with the correct person using two identifiers.  Location: Patient: Home Provider: Office   I discussed the limitations of evaluation and management by telemedicine and the availability of in person appointments. The patient expressed understanding and agreed to proceed.  History of Present Illness:    Observations/Objective:   Assessment and Plan:   Follow Up Instructions:    I discussed the assessment and treatment plan with the patient. The patient was provided an opportunity to ask questions and all were answered. The patient agreed with the plan and demonstrated an understanding of the instructions.   The patient was advised to call back or seek an in-person evaluation if the symptoms worsen or if the condition fails to improve as anticipated.  I provided 15 between talking with patient and documentation and reviewing chart minutes of non-face-to-face time during this encounter.   Glendia Fielding, MD Patient relates that she started having chest congestion and coughing over the past week she has a history of COPD she uses her inhalers she relates recently having some intermittent wheezing tightness in the lungs bringing up phlegm denies high fever chill sweats otherwise no significant shortness of breath no sweats  Review of Systems     Objective:   Physical Exam  Patient had virtual visit-video Appears to be in no distress Atraumatic Neuro able to relate and oriented No apparent resp distress Color normal       Assessment & Plan:  COPD  exacerbation Go ahead with prednisone  taper Doxycycline  twice daily for 10 days with a snack and a tall glass of water  If progressive shortness of breath coughing wheezing difficulty breathing notify us  come to be seen or go to ER Follow-up for all regular follow-up visits  "

## 2024-08-09 NOTE — Telephone Encounter (Signed)
 Nurses-may set up with a virtual visit with me today thank you

## 2024-08-25 ENCOUNTER — Ambulatory Visit (HOSPITAL_BASED_OUTPATIENT_CLINIC_OR_DEPARTMENT_OTHER): Admitting: Pulmonary Disease

## 2024-09-23 ENCOUNTER — Inpatient Hospital Stay

## 2024-09-30 ENCOUNTER — Ambulatory Visit: Admitting: Oncology

## 2024-09-30 ENCOUNTER — Inpatient Hospital Stay: Admitting: Oncology

## 2024-10-04 ENCOUNTER — Inpatient Hospital Stay: Admitting: Oncology

## 2024-11-21 ENCOUNTER — Ambulatory Visit (HOSPITAL_COMMUNITY)

## 2024-12-13 ENCOUNTER — Ambulatory Visit: Admitting: Family Medicine
